# Patient Record
Sex: Female | Born: 1940 | Race: White | Hispanic: No | Marital: Married | Smoking: Never smoker
Health system: Southern US, Community
[De-identification: ages and names within clinical notes are randomized; demographics above are authoritative.]

## PROBLEM LIST (undated history)

## (undated) DIAGNOSIS — N189 Chronic kidney disease, unspecified: Secondary | ICD-10-CM

## (undated) DIAGNOSIS — E785 Hyperlipidemia, unspecified: Secondary | ICD-10-CM

## (undated) DIAGNOSIS — I10 Essential (primary) hypertension: Secondary | ICD-10-CM

## (undated) DIAGNOSIS — I480 Paroxysmal atrial fibrillation: Secondary | ICD-10-CM

## (undated) DIAGNOSIS — F0390 Unspecified dementia without behavioral disturbance: Secondary | ICD-10-CM

---

## 2004-03-19 ENCOUNTER — Ambulatory Visit: Payer: Self-pay | Admitting: Unknown Physician Specialty

## 2005-01-08 ENCOUNTER — Ambulatory Visit: Payer: Self-pay | Admitting: Internal Medicine

## 2006-01-09 ENCOUNTER — Ambulatory Visit: Payer: Self-pay | Admitting: Internal Medicine

## 2007-01-12 ENCOUNTER — Ambulatory Visit: Payer: Self-pay | Admitting: Internal Medicine

## 2007-10-07 ENCOUNTER — Other Ambulatory Visit: Payer: Self-pay

## 2007-10-07 ENCOUNTER — Emergency Department: Payer: Self-pay | Admitting: Emergency Medicine

## 2008-01-13 ENCOUNTER — Ambulatory Visit: Payer: Self-pay | Admitting: Internal Medicine

## 2009-01-16 ENCOUNTER — Ambulatory Visit: Payer: Self-pay | Admitting: Internal Medicine

## 2010-01-17 ENCOUNTER — Ambulatory Visit: Payer: Self-pay | Admitting: Internal Medicine

## 2011-01-21 ENCOUNTER — Ambulatory Visit: Payer: Self-pay | Admitting: Family Medicine

## 2012-01-22 ENCOUNTER — Ambulatory Visit: Payer: Self-pay | Admitting: Family Medicine

## 2013-01-22 ENCOUNTER — Ambulatory Visit: Payer: Self-pay | Admitting: Family Medicine

## 2014-01-24 ENCOUNTER — Ambulatory Visit: Payer: Self-pay | Admitting: Family Medicine

## 2014-10-06 ENCOUNTER — Other Ambulatory Visit: Payer: Self-pay

## 2014-10-06 DIAGNOSIS — Z1231 Encounter for screening mammogram for malignant neoplasm of breast: Secondary | ICD-10-CM

## 2015-01-26 ENCOUNTER — Other Ambulatory Visit: Payer: Self-pay

## 2015-01-26 ENCOUNTER — Ambulatory Visit: Admission: RE | Admit: 2015-01-26 | Payer: Medicare Other | Source: Ambulatory Visit

## 2015-01-26 ENCOUNTER — Other Ambulatory Visit: Payer: Self-pay | Admitting: Family Medicine

## 2015-01-26 ENCOUNTER — Ambulatory Visit
Admission: RE | Admit: 2015-01-26 | Discharge: 2015-01-26 | Disposition: A | Discharge status: Home / Self Care | Payer: Medicare Other | Source: Ambulatory Visit | Attending: Family Medicine | Admitting: Family Medicine

## 2015-01-26 DIAGNOSIS — Z1231 Encounter for screening mammogram for malignant neoplasm of breast: Secondary | ICD-10-CM

## 2015-08-23 ENCOUNTER — Other Ambulatory Visit: Payer: Self-pay | Admitting: Family Medicine

## 2015-08-23 DIAGNOSIS — Z1231 Encounter for screening mammogram for malignant neoplasm of breast: Secondary | ICD-10-CM

## 2016-01-29 ENCOUNTER — Other Ambulatory Visit: Payer: Self-pay | Admitting: Family Medicine

## 2016-01-29 ENCOUNTER — Ambulatory Visit
Admission: RE | Admit: 2016-01-29 | Discharge: 2016-01-29 | Disposition: A | Discharge status: Home / Self Care | Payer: Medicare Other | Source: Ambulatory Visit | Attending: Family Medicine | Admitting: Family Medicine

## 2016-01-29 DIAGNOSIS — R928 Other abnormal and inconclusive findings on diagnostic imaging of breast: Secondary | ICD-10-CM | POA: Diagnosis not present

## 2016-01-29 DIAGNOSIS — Z1231 Encounter for screening mammogram for malignant neoplasm of breast: Secondary | ICD-10-CM

## 2016-01-30 ENCOUNTER — Other Ambulatory Visit: Payer: Self-pay | Admitting: Family Medicine

## 2016-01-30 DIAGNOSIS — N63 Unspecified lump in unspecified breast: Secondary | ICD-10-CM

## 2016-02-21 ENCOUNTER — Ambulatory Visit
Admission: RE | Admit: 2016-02-21 | Discharge: 2016-02-21 | Disposition: A | Discharge status: Home / Self Care | Payer: Medicare Other | Source: Ambulatory Visit | Attending: Family Medicine | Admitting: Family Medicine

## 2016-02-21 DIAGNOSIS — N63 Unspecified lump in unspecified breast: Secondary | ICD-10-CM

## 2016-04-18 ENCOUNTER — Other Ambulatory Visit: Payer: Self-pay | Admitting: Family Medicine

## 2016-04-18 DIAGNOSIS — N63 Unspecified lump in unspecified breast: Secondary | ICD-10-CM

## 2016-04-18 DIAGNOSIS — Z0000 Encounter for general adult medical examination without abnormal findings: Secondary | ICD-10-CM | POA: Insufficient documentation

## 2016-08-21 ENCOUNTER — Ambulatory Visit: Payer: Medicare Other | Attending: Family Medicine

## 2016-08-21 ENCOUNTER — Other Ambulatory Visit: Payer: Medicare Other

## 2016-09-26 ENCOUNTER — Ambulatory Visit
Admission: RE | Admit: 2016-09-26 | Discharge: 2016-09-26 | Disposition: A | Discharge status: Home / Self Care | Payer: Medicare Other | Source: Ambulatory Visit | Attending: Family Medicine | Admitting: Family Medicine

## 2016-09-26 DIAGNOSIS — N6320 Unspecified lump in the left breast, unspecified quadrant: Secondary | ICD-10-CM | POA: Diagnosis present

## 2016-09-26 DIAGNOSIS — N63 Unspecified lump in unspecified breast: Secondary | ICD-10-CM

## 2017-02-10 ENCOUNTER — Other Ambulatory Visit: Payer: Self-pay | Admitting: Family Medicine

## 2017-02-10 DIAGNOSIS — Z1231 Encounter for screening mammogram for malignant neoplasm of breast: Secondary | ICD-10-CM

## 2017-02-13 ENCOUNTER — Ambulatory Visit
Admission: RE | Admit: 2017-02-13 | Discharge: 2017-02-13 | Disposition: A | Discharge status: Home / Self Care | Payer: Medicare Other | Source: Ambulatory Visit | Attending: Family Medicine | Admitting: Family Medicine

## 2017-02-13 DIAGNOSIS — Z1231 Encounter for screening mammogram for malignant neoplasm of breast: Secondary | ICD-10-CM | POA: Diagnosis present

## 2017-08-22 ENCOUNTER — Other Ambulatory Visit: Payer: Self-pay | Admitting: Family Medicine

## 2017-08-22 DIAGNOSIS — Z1231 Encounter for screening mammogram for malignant neoplasm of breast: Secondary | ICD-10-CM

## 2018-02-16 ENCOUNTER — Ambulatory Visit
Admission: RE | Admit: 2018-02-16 | Discharge: 2018-02-16 | Disposition: A | Discharge status: Home / Self Care | Payer: Medicare Other | Source: Ambulatory Visit | Attending: Family Medicine | Admitting: Family Medicine

## 2018-02-16 DIAGNOSIS — Z1231 Encounter for screening mammogram for malignant neoplasm of breast: Secondary | ICD-10-CM | POA: Insufficient documentation

## 2020-02-11 DIAGNOSIS — N1832 Chronic kidney disease, stage 3b: Secondary | ICD-10-CM | POA: Insufficient documentation

## 2020-03-27 ENCOUNTER — Emergency Department: Payer: Medicare PPO

## 2020-03-27 ENCOUNTER — Other Ambulatory Visit: Payer: Self-pay

## 2020-03-27 ENCOUNTER — Encounter: Payer: Self-pay | Admitting: Emergency Medicine

## 2020-03-27 ENCOUNTER — Inpatient Hospital Stay
Admission: EM | Admit: 2020-03-27 | Discharge: 2020-03-30 | DRG: 309 | Disposition: A | Discharge status: Home Health | Payer: Medicare PPO | Attending: Internal Medicine | Admitting: Internal Medicine

## 2020-03-27 DIAGNOSIS — F05 Delirium due to known physiological condition: Secondary | ICD-10-CM | POA: Diagnosis not present

## 2020-03-27 DIAGNOSIS — E876 Hypokalemia: Secondary | ICD-10-CM | POA: Diagnosis present

## 2020-03-27 DIAGNOSIS — E785 Hyperlipidemia, unspecified: Secondary | ICD-10-CM | POA: Diagnosis present

## 2020-03-27 DIAGNOSIS — R7401 Elevation of levels of liver transaminase levels: Secondary | ICD-10-CM | POA: Diagnosis present

## 2020-03-27 DIAGNOSIS — N179 Acute kidney failure, unspecified: Secondary | ICD-10-CM | POA: Diagnosis present

## 2020-03-27 DIAGNOSIS — R5381 Other malaise: Secondary | ICD-10-CM

## 2020-03-27 DIAGNOSIS — Z882 Allergy status to sulfonamides status: Secondary | ICD-10-CM | POA: Diagnosis not present

## 2020-03-27 DIAGNOSIS — I4891 Unspecified atrial fibrillation: Secondary | ICD-10-CM | POA: Diagnosis present

## 2020-03-27 DIAGNOSIS — Z888 Allergy status to other drugs, medicaments and biological substances status: Secondary | ICD-10-CM | POA: Diagnosis not present

## 2020-03-27 DIAGNOSIS — D649 Anemia, unspecified: Secondary | ICD-10-CM | POA: Diagnosis present

## 2020-03-27 DIAGNOSIS — R8271 Bacteriuria: Secondary | ICD-10-CM | POA: Diagnosis present

## 2020-03-27 DIAGNOSIS — B961 Klebsiella pneumoniae [K. pneumoniae] as the cause of diseases classified elsewhere: Secondary | ICD-10-CM | POA: Diagnosis present

## 2020-03-27 DIAGNOSIS — R55 Syncope and collapse: Secondary | ICD-10-CM | POA: Diagnosis present

## 2020-03-27 DIAGNOSIS — I361 Nonrheumatic tricuspid (valve) insufficiency: Secondary | ICD-10-CM | POA: Diagnosis not present

## 2020-03-27 DIAGNOSIS — I10 Essential (primary) hypertension: Secondary | ICD-10-CM | POA: Diagnosis present

## 2020-03-27 DIAGNOSIS — F0390 Unspecified dementia without behavioral disturbance: Secondary | ICD-10-CM | POA: Diagnosis present

## 2020-03-27 DIAGNOSIS — Z20822 Contact with and (suspected) exposure to covid-19: Secondary | ICD-10-CM | POA: Diagnosis present

## 2020-03-27 DIAGNOSIS — Z880 Allergy status to penicillin: Secondary | ICD-10-CM | POA: Diagnosis not present

## 2020-03-27 DIAGNOSIS — I340 Nonrheumatic mitral (valve) insufficiency: Secondary | ICD-10-CM | POA: Diagnosis not present

## 2020-03-27 HISTORY — DX: Essential (primary) hypertension: I10

## 2020-03-27 LAB — BASIC METABOLIC PANEL
Anion gap: 12 (ref 5–15)
BUN: 37 mg/dL — ABNORMAL HIGH (ref 8–23)
CO2: 23 mmol/L (ref 22–32)
Calcium: 8 mg/dL — ABNORMAL LOW (ref 8.9–10.3)
Chloride: 99 mmol/L (ref 98–111)
Creatinine, Ser: 1.8 mg/dL — ABNORMAL HIGH (ref 0.44–1.00)
GFR, Estimated: 28 mL/min — ABNORMAL LOW (ref >60)
Glucose, Bld: 129 mg/dL — ABNORMAL HIGH (ref 70–99)
Potassium: <2 mmol/L — CL (ref 3.5–5.1)
Sodium: 134 mmol/L — ABNORMAL LOW (ref 135–145)

## 2020-03-27 LAB — CBC
HCT: 34.7 % — ABNORMAL LOW (ref 36.0–46.0)
Hemoglobin: 11.7 g/dL — ABNORMAL LOW (ref 12.0–15.0)
MCH: 29.5 pg (ref 26.0–34.0)
MCHC: 33.7 g/dL (ref 30.0–36.0)
MCV: 87.6 fL (ref 80.0–100.0)
Platelets: 187 10*3/uL (ref 150–400)
RBC: 3.96 MIL/uL (ref 3.87–5.11)
RDW: 13.2 % (ref 11.5–15.5)
WBC: 9.5 10*3/uL (ref 4.0–10.5)
nRBC: 0 % (ref 0.0–0.2)

## 2020-03-27 LAB — HEPATIC FUNCTION PANEL
ALT: 22 U/L (ref 0–44)
AST: 54 U/L — ABNORMAL HIGH (ref 15–41)
Albumin: 3.1 g/dL — ABNORMAL LOW (ref 3.5–5.0)
Alkaline Phosphatase: 62 U/L (ref 38–126)
Bilirubin, Direct: 0.2 mg/dL (ref 0.0–0.2)
Indirect Bilirubin: 0.7 mg/dL (ref 0.3–0.9)
Total Bilirubin: 0.9 mg/dL (ref 0.3–1.2)
Total Protein: 7.1 g/dL (ref 6.5–8.1)

## 2020-03-27 LAB — MAGNESIUM: Magnesium: 1.5 mg/dL — ABNORMAL LOW (ref 1.7–2.4)

## 2020-03-27 LAB — TROPONIN I (HIGH SENSITIVITY): Troponin I (High Sensitivity): 25 ng/L — ABNORMAL HIGH (ref <18)

## 2020-03-27 MED ORDER — MAGNESIUM SULFATE 2 GM/50ML IV SOLN
2.0000 g | Freq: Once | INTRAVENOUS | Status: AC
  Administered 2020-03-28: 2 g via INTRAVENOUS
  Filled 2020-03-27: qty 50

## 2020-03-27 MED ORDER — ONDANSETRON HCL 4 MG/2ML IJ SOLN: 4.0000 mg | Freq: Four times a day (QID) | INTRAMUSCULAR | Status: DC | PRN

## 2020-03-27 MED ORDER — ALPRAZOLAM 0.25 MG PO TABS
0.2500 mg | ORAL_TABLET | Freq: Two times a day (BID) | ORAL | Status: DC | PRN
  Administered 2020-03-28: 0.25 mg via ORAL
  Filled 2020-03-27: qty 1

## 2020-03-27 MED ORDER — POTASSIUM CHLORIDE IN NACL 40-0.9 MEQ/L-% IV SOLN
INTRAVENOUS | Status: DC
  Filled 2020-03-27 (×7): qty 1000

## 2020-03-27 MED ORDER — SODIUM CHLORIDE 0.9 % IV BOLUS
1000.0000 mL | Freq: Once | INTRAVENOUS | Status: AC
  Administered 2020-03-27: 1000 mL via INTRAVENOUS

## 2020-03-27 MED ORDER — POTASSIUM CHLORIDE 10 MEQ/100ML IV SOLN
10.0000 meq | INTRAVENOUS | Status: AC
  Administered 2020-03-27 – 2020-03-28 (×4): 10 meq via INTRAVENOUS
  Filled 2020-03-27 (×4): qty 100

## 2020-03-27 MED ORDER — ACETAMINOPHEN 325 MG PO TABS: 650.0000 mg | ORAL_TABLET | ORAL | Status: DC | PRN

## 2020-03-27 MED ORDER — PRAVASTATIN SODIUM 40 MG PO TABS
40.0000 mg | ORAL_TABLET | Freq: Every day | ORAL | Status: DC
  Administered 2020-03-28 – 2020-03-29 (×2): 40 mg via ORAL
  Filled 2020-03-27 (×2): qty 1

## 2020-03-27 MED ORDER — POTASSIUM CHLORIDE CRYS ER 20 MEQ PO TBCR
40.0000 meq | EXTENDED_RELEASE_TABLET | Freq: Once | ORAL | Status: AC
  Administered 2020-03-27: 40 meq via ORAL
  Filled 2020-03-27: qty 2

## 2020-03-27 MED ORDER — AMLODIPINE BESYLATE 5 MG PO TABS
5.0000 mg | ORAL_TABLET | Freq: Every day | ORAL | Status: DC
  Administered 2020-03-28 – 2020-03-29 (×2): 5 mg via ORAL
  Filled 2020-03-27 (×2): qty 1

## 2020-03-27 MED ORDER — SODIUM CHLORIDE 0.9 % IV SOLN: Freq: Once | INTRAVENOUS | Status: AC

## 2020-03-27 MED ORDER — CALCIUM-VITAMIN D 500-200 MG-UNIT PO TABS
2.0000 | ORAL_TABLET | Freq: Every day | ORAL | Status: DC
  Filled 2020-03-27: qty 2

## 2020-03-27 MED ORDER — DILTIAZEM HCL-DEXTROSE 125-5 MG/125ML-% IV SOLN (PREMIX): 5.0000 mg/h | INTRAVENOUS | Status: DC

## 2020-03-27 MED ORDER — APIXABAN 2.5 MG PO TABS
2.5000 mg | ORAL_TABLET | Freq: Two times a day (BID) | ORAL | Status: DC
  Administered 2020-03-28 – 2020-03-30 (×6): 2.5 mg via ORAL
  Filled 2020-03-27 (×8): qty 1

## 2020-03-27 MED ORDER — ZOLPIDEM TARTRATE 5 MG PO TABS
5.0000 mg | ORAL_TABLET | Freq: Every evening | ORAL | Status: DC | PRN
  Administered 2020-03-29: 5 mg via ORAL
  Filled 2020-03-27: qty 1

## 2020-03-27 MED ORDER — MAGNESIUM SULFATE 2 GM/50ML IV SOLN
2.0000 g | Freq: Once | INTRAVENOUS | Status: AC
  Administered 2020-03-27: 2 g via INTRAVENOUS
  Filled 2020-03-27: qty 50

## 2020-03-27 MED ORDER — ADULT MULTIVITAMIN W/MINERALS CH
1.0000 | ORAL_TABLET | Freq: Every day | ORAL | Status: DC
  Administered 2020-03-28 – 2020-03-30 (×3): 1 via ORAL
  Filled 2020-03-27 (×3): qty 1

## 2020-03-27 MED ORDER — DILTIAZEM HCL-DEXTROSE 125-5 MG/125ML-% IV SOLN (PREMIX)
5.0000 mg/h | INTRAVENOUS | Status: DC
  Administered 2020-03-27: 5 mg/h via INTRAVENOUS
  Administered 2020-03-28 – 2020-03-29 (×2): 7.5 mg/h via INTRAVENOUS
  Filled 2020-03-27 (×3): qty 125

## 2020-03-27 NOTE — H&P (Signed)
PATIENT NAME: Christine Johns    MR#:  [id number]  DATE OF BIRTH:  1941-06-02  DATE OF ADMISSION:  03/27/2020  PRIMARY CARE PHYSICIAN: Marisue Ivan, MD   REQUESTING/REFERRING PHYSICIAN: Willy Eddy, MD  CHIEF COMPLAINT:   Chief Complaint  Patient presents with  . Loss of Consciousness    HISTORY OF PRESENT ILLNESS:  Christine Johns  is a 79 y.o. female with a known history of hypertension and dementia, presented to the emergency room with acute onset of syncope today.  She had a near syncope earlier that was witnessed by her husband when she went to the bathroom and was coming back and before she went down her husband caught her.  She hurt her right knee falling.  Her son-in-law gives most of the history and give her Mobic and within 10 minutes he slumped over in her chair and lost consciousness for less than a minute per his report.  He called 911.  She denied any chest pain or palpitations cough or wheezing or hemoptysis.  He stated that she cannot with no vomiting.  No fever or chills.  No dysuria, oliguria, hematuria or urgency or frequency or flank pain.  Upon presentation to the emergency room, blood pressure was 106/74 with a heart rate of 143 and otherwise normal vital signs.  Labs revealed hypokalemia with potassium less than 2 magnesium was 1.5 and a BUN of 37 with creatinine 1.8.  LFTs showed slightly elevated AST and low albumin of 3.1.  High-sensitivity troponin was 25.  CBC showed mild anemia and respiratory panel is currently pending.  Chest x-ray showed streaky opacities in the left lung base favoring atelectasis though early airspace disease could present similarly.  Right knee x-ray showed tricompartmental degenerative changes that is most severe in the patellofemoral compartment with no acute osseous abnormalities.  The patient was given 1 L bolus of IV normal saline for 100 mL/h, IV Cardizem drip, 2 g of IV magnesium sulfate, 10-minute: As IV  potassium chloride and 40 mEq p.o. potassium chloride.  She will be admitted to progressive unit bed for further evaluation and management. PAST MEDICAL HISTORY:   Past Medical History:  Diagnosis Date  . Hypertension     PAST SURGICAL HISTORY:  History reviewed. No pertinent surgical history.  She did not have any previous surgeries. SOCIAL HISTORY:   Social History   Tobacco Use  . Smoking status: Never Smoker  . Smokeless tobacco: Never Used  Substance Use Topics  . Alcohol use: Never    FAMILY HISTORY:   Family History  Problem Relation Age of Onset  . Breast cancer Neg Hx   Her brother died from massive MI.  DRUG ALLERGIES:   Allergies  Allergen Reactions  . Atorvastatin Other (See Comments)  . Penicillins Itching and Swelling  . Sulfa Antibiotics Itching and Swelling    REVIEW OF SYSTEMS:   ROS As per history of present illness. All pertinent systems were reviewed above. Constitutional, HEENT, cardiovascular, respiratory, GI, GU, musculoskeletal, neuro, psychiatric, endocrine, integumentary and hematologic systems were reviewed and are otherwise negative/unremarkable except for positive findings mentioned above in the HPI.   MEDICATIONS AT HOME:   Prior to Admission medications   Medication Sig Start Date End Date Taking? Authorizing Provider  amLODipine (NORVASC) 5 MG tablet Take 5 mg by mouth daily. 03/16/20  Yes [provider]  Calcium Carbonate-Vitamin D 600-400 MG-UNIT tablet Take 2 tablets by mouth daily.   Yes  [provider]  lisinopril (ZESTRIL) 40 MG tablet Take 40 mg by mouth daily. 03/16/20  Yes [provider]  lovastatin (MEVACOR) 20 MG tablet Take 30 mg by mouth at bedtime.   Yes [provider]  meloxicam (MOBIC) 15 MG tablet Take 15 mg by mouth daily. 03/27/20  Yes [provider]  Multiple Vitamin (MULTI-VITAMIN) tablet Take 1 tablet by mouth daily.   Yes [provider]      VITAL  SIGNS:  Blood pressure 107/64, pulse (!) 111, temperature 98 F (36.7 C), temperature source Oral, resp. rate 15, height 5\' 3"  (1.6 m), weight 59.9 kg, SpO2 99 %.  PHYSICAL EXAMINATION:  Physical Exam  GENERAL:  79 y.o.-year-old pleasantly demented Caucasian female patient lying in the bed with no acute distress.  EYES: Pupils equal, round, reactive to light and accommodation. No scleral icterus. Extraocular muscles intact.  HEENT: Head atraumatic, normocephalic. Oropharynx and nasopharynx clear.  NECK:  Supple, no jugular venous distention. No thyroid enlargement, no tenderness.  LUNGS: Normal breath sounds bilaterally, no wheezing, rales,rhonchi or crepitation. No use of accessory muscles of respiration.  CARDIOVASCULAR: Irregularly irregular tachycardic rhythm, S1, S2 normal. No murmurs, rubs, or gallops.  ABDOMEN: Soft, nondistended, nontender. Bowel sounds present. No organomegaly or mass.  EXTREMITIES: No pedal edema, cyanosis, or clubbing.  NEUROLOGIC: Cranial nerves II through XII are intact. Muscle strength 5/5 in all extremities. Sensation intact. Gait not checked.  PSYCHIATRIC: The patient is alert and oriented x 2 to place and person but not to time.  Normal affect and good eye contact. SKIN: No obvious rash, lesion, or ulcer.   LABORATORY PANEL:   CBC Recent Labs  Lab 03/27/20 2135  WBC 9.5  HGB 11.7*  HCT 34.7*  PLT 187   ------------------------------------------------------------------------------------------------------------------  Chemistries  Recent Labs  Lab 03/27/20 2135 03/27/20 2300  NA 134*  --   K <2.0*  --   CL 99  --   CO2 23  --   GLUCOSE 129*  --   BUN 37*  --   CREATININE 1.80*  --   CALCIUM 8.0*  --   MG 1.5*  --   AST  --  54*  ALT  --  22  ALKPHOS  --  62  BILITOT  --  0.9   ------------------------------------------------------------------------------------------------------------------  Cardiac Enzymes No results for input(s):  TROPONINI in the last 168 hours. ------------------------------------------------------------------------------------------------------------------  RADIOLOGY:  CT Head Wo Contrast  Result Date: 03/27/2020 CLINICAL DATA:  Syncope. EXAM: CT HEAD WITHOUT CONTRAST TECHNIQUE: Contiguous axial images were obtained from the base of the skull through the vertex without intravenous contrast. COMPARISON:  None. FINDINGS: Brain: There is mild cerebral atrophy with widening of the extra-axial spaces and ventricular dilatation. There are areas of decreased attenuation within the white matter tracts of the supratentorial brain, consistent with microvascular disease changes. Vascular: No hyperdense vessel or unexpected calcification. Skull: Normal. Negative for fracture or focal lesion. Sinuses/Orbits: No acute finding. Other: None. IMPRESSION: 1. Generalized cerebral atrophy. 2. No acute intracranial abnormality. Electronically Signed   By: 03/29/2020 M.D.   On: 03/27/2020 22:51   DG Chest Portable 1 View  Result Date: 03/27/2020 CLINICAL DATA:  Syncope, falls EXAM: PORTABLE CHEST 1 VIEW COMPARISON:  None. FINDINGS: Streaky opacities in the left lung base, possibly reflecting atelectasis or early airspace disease. Lungs are otherwise clear. No pneumothorax or visible effusion. The aorta is calcified. The remaining cardiomediastinal contours are unremarkable. No acute osseous or soft tissue abnormality.  Degenerative changes are present in the imaged spine and shoulders. Telemetry leads overlie the chest. IMPRESSION: Streaky opacities in the left lung base, favor atelectasis though early airspace disease could present similarly in the appropriate clinical context. Electronically Signed   By: Kreg Shropshire M.D.   On: 03/27/2020 21:55   DG Knee Complete 4 Views Right  Result Date: 03/27/2020 CLINICAL DATA:  Status post trauma. EXAM: RIGHT KNEE - COMPLETE 4+ VIEW COMPARISON:  None. FINDINGS: No evidence of  fracture, dislocation, or joint effusion. There is mild medial and lateral tibiofemoral compartment space narrowing. Marked severity patellofemoral narrowing is noted. Soft tissues are unremarkable. IMPRESSION: 1. No acute osseous abnormality. 2. Tricompartmental degenerative changes, most severe in the patellofemoral compartment. Electronically Signed   By: Aram Candela M.D.   On: 03/27/2020 23:06      IMPRESSION AND PLAN:   1.  Syncope likely secondary to new onset atrial fibrillation with rapid ventricular response. -The patient will be admitted to a progressive unit bed. -We will continue her on IV Cardizem drip. -His CHA2DS2-VASc score is 4 and therefore we will start her on p.o. Eliquis. -Cardiology consult and 2D echo will be obtained. -I notified Dr. Duke Salvia about the patient.  2.  Severe hypokalemia. -Potassium will be aggressively replaced and followed.  3.  Hypomagnesemia. -Magnesium will be optimized.  4.  Acute kidney injury. -The patient will be hydrated with IV normal saline and will follow BMPs. -We will hold off his ACE inhibitor therapy as well as Mobic.  5.  Dyslipidemia. -We will continue statin therapy.  6.  DVT prophylaxis. -The patient be placed on p.o. Eliquis as mentioned above.    All the records are reviewed and case discussed with ED provider. The plan of care was discussed in details with the patient (and family). I answered all questions. The patient agreed to proceed with the above mentioned plan. Further management will depend upon hospital course.  CODE STATUS: Full code  Status is: Inpatient  Remains inpatient appropriate because:Altered mental status, Ongoing diagnostic testing needed not appropriate for outpatient work up, Unsafe d/c plan, IV treatments appropriate due to intensity of illness or inability to take PO and Inpatient level of care appropriate due to severity of illness   Dispo: The patient is from: Home               Anticipated d/c is to: Home              Anticipated d/c date is: 2 days              Patient currently is not medically stable to d/c.   TOTAL TIME TAKING CARE OF THIS PATIENT: 55 minutes.    Hannah Beat M.D on 03/27/2020 at 11:40 PM  [facility]   From 7 PM-7 AM, contact night-coverage [email or web address]  CC: Primary care physician; Marisue Ivan, MD

## 2020-03-27 NOTE — ED Notes (Signed)
Date and time results received: 03/27/20 2222 (use smartphrase ".now" to insert current time)  Test: potassium Critical Value: <2  Name of Provider Notified: robinson  Orders Received? Or Actions Taken?: Orders Received - See Orders for details

## 2020-03-27 NOTE — ED Provider Notes (Signed)
[facility] Emergency Department Provider Note    First MD Initiated Contact with Patient 03/27/20 2122     (approximate)  I have reviewed the triage vital signs and the nursing notes.   HISTORY  Chief Complaint Loss of Consciousness  History limited due to dementia  HPI JAICE LAGUE is a 79 y.o. female with a history of hypertension presents to the ER for evaluation of syncopal event.  Patient amnestic to any syncopal event denies any pain.  EMS was called to evaluate patient and found her in A. fib with RVR.  Patient unable to provide any additional history.  No reported history of A. fib.  Not on anticoagulation.  No new medications.    Past Medical History:  Diagnosis Date  . Hypertension    Family History  Problem Relation Age of Onset  . Breast cancer Neg Hx    History reviewed. No pertinent surgical history. There are no problems to display for this patient.     Prior to Admission medications   Medication Sig Start Date End Date Taking? Authorizing Provider  amLODipine (NORVASC) 5 MG tablet Take 5 mg by mouth daily. 03/16/20  Yes [provider]  Calcium Carbonate-Vitamin D 600-400 MG-UNIT tablet Take 2 tablets by mouth daily.   Yes [provider]  lisinopril (ZESTRIL) 40 MG tablet Take 40 mg by mouth daily. 03/16/20  Yes [provider]  lovastatin (MEVACOR) 20 MG tablet Take 30 mg by mouth at bedtime.   Yes [provider]  meloxicam (MOBIC) 15 MG tablet Take 15 mg by mouth daily. 03/27/20  Yes [provider]  Multiple Vitamin (MULTI-VITAMIN) tablet Take 1 tablet by mouth daily.   Yes [provider]    Allergies Atorvastatin, Penicillins, and Sulfa antibiotics    Social History Social History   Tobacco Use  . Smoking status: Never Smoker  . Smokeless tobacco: Never Used  Substance Use Topics  . Alcohol use: Never  . Drug use: Never    Review of Systems Patient  denies headaches, rhinorrhea, blurry vision, numbness, shortness of breath, chest pain, edema, cough, abdominal pain, nausea, vomiting, diarrhea, dysuria, fevers, rashes or hallucinations unless otherwise stated above in HPI. ____________________________________________   PHYSICAL EXAM:  VITAL SIGNS: Vitals:   03/27/20 2128 03/27/20 2136  BP:  106/74  Pulse: (!) 143   Resp: 15   Temp: 98 F (36.7 C)   SpO2: 99%     Constitutional: Alert, disoriented but pleasant and cooperative Eyes: Conjunctivae are normal.  Head: Atraumatic. Nose: No congestion/rhinnorhea. Mouth/Throat: Mucous membranes are moist.   Neck: No stridor. Painless ROM.  Cardiovascular: tachycardic irregularly irregular rhythm. Grossly normal heart sounds.  Good peripheral circulation. Respiratory: Normal respiratory effort.  No retractions. Lungs CTAB. Gastrointestinal: Soft and nontender. No distention. No abdominal bruits. No CVA tenderness. Genitourinary: deferred Musculoskeletal: No lower extremity tenderness nor edema.  No joint effusions. Neurologic:  Normal speech and language. No gross focal neurologic deficits are appreciated. No facial droop Skin:  Skin is warm, dry and intact. No rash noted. Psychiatric: pleasant and cooperative ____________________________________________   LABS (all labs ordered are listed, but only abnormal results are displayed)  Results for orders placed or performed during the hospital encounter of 03/27/20 (from the past 24 hour(s))  Basic metabolic panel     Status: Abnormal   Collection Time: 03/27/20  9:35 PM  Result Value Ref Range   Sodium 134 (L) 135 - 145 mmol/L   Potassium <  2.0 (LL) 3.5 - 5.1 mmol/L   Chloride 99 98 - 111 mmol/L   CO2 23 22 - 32 mmol/L   Glucose, Bld 129 (H) 70 - 99 mg/dL   BUN 37 (H) 8 - 23 mg/dL   Creatinine, Ser 6.19 (H) 0.44 - 1.00 mg/dL   Calcium 8.0 (L) 8.9 - 10.3 mg/dL   GFR, Estimated 28 (L) >60 mL/min   Anion gap 12 5 - 15  CBC      Status: Abnormal   Collection Time: 03/27/20  9:35 PM  Result Value Ref Range   WBC 9.5 4.0 - 10.5 K/uL   RBC 3.96 3.87 - 5.11 MIL/uL   Hemoglobin 11.7 (L) 12.0 - 15.0 g/dL   HCT 50.9 (L) 36 - 46 %   MCV 87.6 80.0 - 100.0 fL   MCH 29.5 26.0 - 34.0 pg   MCHC 33.7 30.0 - 36.0 g/dL   RDW 32.6 71.2 - 45.8 %   Platelets 187 150 - 400 K/uL   nRBC 0.0 0.0 - 0.2 %  Troponin I (High Sensitivity)     Status: Abnormal   Collection Time: 03/27/20  9:35 PM  Result Value Ref Range   Troponin I (High Sensitivity) 25 (H) <18 ng/L  Magnesium     Status: Abnormal   Collection Time: 03/27/20  9:35 PM  Result Value Ref Range   Magnesium 1.5 (L) 1.7 - 2.4 mg/dL   ____________________________________________  EKG My review and personal interpretation at Time: 21:28   Indication: syncope  Rate: 145  Rhythm: afib with rvr Axis: normal Other: nonspecific st abn likely rate dependent, abnml ekg ____________________________________________  RADIOLOGY  I personally reviewed all radiographic images ordered to evaluate for the above acute complaints and reviewed radiology reports and findings.  These findings were personally discussed with the patient.  Please see medical record for radiology report. ____________________________________________   PROCEDURES  Procedure(s) performed:  .Critical Care Performed by: Willy Eddy, MD Authorized by: Willy Eddy, MD   Critical care provider statement:    Critical care time (minutes):  35   Critical care time was exclusive of:  Separately billable procedures and treating other patients   Critical care was necessary to treat or prevent imminent or life-threatening deterioration of the following conditions:  Cardiac failure   Critical care was time spent personally by me on the following activities:  Development of treatment plan with patient or surrogate, discussions with consultants, evaluation of patient's response to treatment, examination of  patient, obtaining history from patient or surrogate, ordering and performing treatments and interventions, ordering and review of laboratory studies, ordering and review of radiographic studies, pulse oximetry, re-evaluation of patient's condition and review of old charts      Critical Care performed: yes ____________________________________________   INITIAL IMPRESSION / ASSESSMENT AND PLAN / ED COURSE  Pertinent labs & imaging results that were available during my care of the patient were reviewed by me and considered in my medical decision making (see chart for details).   DDX: Dehydration, dysrhythmia, electrolyte abnormality, anemia, sepsis, IPH  MENDE BISWELL is a 79 y.o. who presents to the ED with presentation as described above patient arrives with A. fib with RVR but normotensive.  Pleasant and cooperative.  Amnestic to any syncopal events.  Will order CT imaging of the head will order x-rays to evaluate for any evidence of traumatic injury.  Her abdominal exam is soft and benign.  She denies any chest pain but blood work sent off  for the but differential.  On cardiac monitor she is in A. fib with RVR heart rates in the 130s.  Will give IV fluids.  Will start on a Cardizem infusion.  Clinical Course as of Mar 27 2254  Mon Mar 27, 2020  2240 Clinic shows evidence of hypomagnesemia I have ordered IV magnesium replacement.  Patient also has significant acute hypokalemia mild AKI.  Will order both IV and oral potassium replacement.  Heart rate now in the 1 teens and 120s significantly improving.   [PR]  2255 Case discussed with son-in-law at bedside.  Confirms that she does not have a previous known history of A. fib.  No clear inciting events to explain her electrolyte abnormalities her presenting complaints.  Informed him that we will be admitting her to the hospital for further medical management.   [PR]    Clinical Course User Index [PR] Willy Eddy, MD    The patient  was evaluated in Emergency Department today for the symptoms described in the history of present illness. He/she was evaluated in the context of the global COVID-19 pandemic, which necessitated consideration that the patient might be at risk for infection with the SARS-CoV-2 virus that causes COVID-19. Institutional protocols and algorithms that pertain to the evaluation of patients at risk for COVID-19 are in a state of rapid change based on information released by regulatory bodies including the CDC and federal and state organizations. These policies and algorithms were followed during the patient's care in the ED.  As part of my medical decision making, I reviewed the following data within the electronic MEDICAL RECORD NUMBER Nursing notes reviewed and incorporated, Labs reviewed, notes from prior ED visits and [place] Controlled Substance Database   ____________________________________________   FINAL CLINICAL IMPRESSION(S) / ED DIAGNOSES  Final diagnoses:  Syncope and collapse  Atrial fibrillation with rapid ventricular response (HCC)  Acute hypokalemia  Hypomagnesemia      NEW MEDICATIONS STARTED DURING THIS VISIT:  New Prescriptions   No medications on file     Note:  This document was prepared using Dragon voice recognition software and may include unintentional dictation errors.    Willy Eddy, MD 03/27/20 2302

## 2020-03-27 NOTE — ED Triage Notes (Signed)
Pt to ED via EMS. Pt had two syncope episodes about hour apart. EMS arrived on scene and pt was in Afib wit heart rate  130s to 170s. Pt has no Hx of Afib. Pt arrived to EMS a/o x 4 and denies pain.  BP:114/74 Temp: 98.6 CBG 164  Pt has 18 gauge in RA.

## 2020-03-28 ENCOUNTER — Encounter: Payer: Self-pay | Admitting: Family Medicine

## 2020-03-28 ENCOUNTER — Inpatient Hospital Stay (HOSPITAL_COMMUNITY)
Admit: 2020-03-28 | Discharge: 2020-03-28 | Disposition: A | Discharge status: Home / Self Care | Payer: Medicare PPO | Attending: Family Medicine | Admitting: Family Medicine

## 2020-03-28 DIAGNOSIS — I4891 Unspecified atrial fibrillation: Secondary | ICD-10-CM | POA: Diagnosis not present

## 2020-03-28 DIAGNOSIS — I340 Nonrheumatic mitral (valve) insufficiency: Secondary | ICD-10-CM

## 2020-03-28 DIAGNOSIS — I361 Nonrheumatic tricuspid (valve) insufficiency: Secondary | ICD-10-CM | POA: Diagnosis not present

## 2020-03-28 DIAGNOSIS — E876 Hypokalemia: Secondary | ICD-10-CM

## 2020-03-28 DIAGNOSIS — R55 Syncope and collapse: Secondary | ICD-10-CM

## 2020-03-28 LAB — CBC
HCT: 29.9 % — ABNORMAL LOW (ref 36.0–46.0)
Hemoglobin: 10.6 g/dL — ABNORMAL LOW (ref 12.0–15.0)
MCH: 30.5 pg (ref 26.0–34.0)
MCHC: 35.5 g/dL (ref 30.0–36.0)
MCV: 85.9 fL (ref 80.0–100.0)
Platelets: 192 10*3/uL (ref 150–400)
RBC: 3.48 MIL/uL — ABNORMAL LOW (ref 3.87–5.11)
RDW: 13.2 % (ref 11.5–15.5)
WBC: 9.4 10*3/uL (ref 4.0–10.5)
nRBC: 0 % (ref 0.0–0.2)

## 2020-03-28 LAB — MAGNESIUM: Magnesium: 2.5 mg/dL — ABNORMAL HIGH (ref 1.7–2.4)

## 2020-03-28 LAB — RESPIRATORY PANEL BY RT PCR (FLU A&B, COVID)
Influenza A by PCR: NEGATIVE
Influenza B by PCR: NEGATIVE
SARS Coronavirus 2 by RT PCR: NEGATIVE

## 2020-03-28 LAB — BASIC METABOLIC PANEL
Anion gap: 10 (ref 5–15)
Anion gap: 10 (ref 5–15)
BUN: 28 mg/dL — ABNORMAL HIGH (ref 8–23)
BUN: 24 mg/dL — ABNORMAL HIGH (ref 8–23)
CO2: 21 mmol/L — ABNORMAL LOW (ref 22–32)
CO2: 20 mmol/L — ABNORMAL LOW (ref 22–32)
Calcium: 8 mg/dL — ABNORMAL LOW (ref 8.9–10.3)
Calcium: 8 mg/dL — ABNORMAL LOW (ref 8.9–10.3)
Chloride: 106 mmol/L (ref 98–111)
Chloride: 108 mmol/L (ref 98–111)
Creatinine, Ser: 1.26 mg/dL — ABNORMAL HIGH (ref 0.44–1.00)
Creatinine, Ser: 1.1 mg/dL — ABNORMAL HIGH (ref 0.44–1.00)
GFR, Estimated: 43 mL/min — ABNORMAL LOW (ref >60)
GFR, Estimated: 50 mL/min — ABNORMAL LOW (ref >60)
Glucose, Bld: 118 mg/dL — ABNORMAL HIGH (ref 70–99)
Glucose, Bld: 124 mg/dL — ABNORMAL HIGH (ref 70–99)
Potassium: 2.5 mmol/L — CL (ref 3.5–5.1)
Potassium: 3.6 mmol/L (ref 3.5–5.1)
Sodium: 137 mmol/L (ref 135–145)
Sodium: 138 mmol/L (ref 135–145)

## 2020-03-28 LAB — URINALYSIS, COMPLETE (UACMP) WITH MICROSCOPIC
Bilirubin Urine: NEGATIVE
Glucose, UA: NEGATIVE mg/dL
Ketones, ur: NEGATIVE mg/dL
Nitrite: POSITIVE — AB
Protein, ur: NEGATIVE mg/dL
Specific Gravity, Urine: 1.009 (ref 1.005–1.030)
pH: 5 (ref 5.0–8.0)

## 2020-03-28 LAB — LIPID PANEL
Cholesterol: 125 mg/dL (ref 0–200)
HDL: 33 mg/dL — ABNORMAL LOW (ref >40)
LDL Cholesterol: 73 mg/dL (ref 0–99)
Total CHOL/HDL Ratio: 3.8 RATIO
Triglycerides: 93 mg/dL (ref <150)
VLDL: 19 mg/dL (ref 0–40)

## 2020-03-28 LAB — BRAIN NATRIURETIC PEPTIDE: B Natriuretic Peptide: 97.7 pg/mL (ref 0.0–100.0)

## 2020-03-28 LAB — ECHOCARDIOGRAM COMPLETE
AR max vel: 1.59 cm2
AV Area VTI: 1.94 cm2
AV Area mean vel: 1.54 cm2
AV Mean grad: 6 mmHg
AV Peak grad: 10.5 mmHg
Ao pk vel: 1.62 m/s
Area-P 1/2: 4.74 cm2
Height: 63 in
S' Lateral: 3.21 cm
Weight: 2011.2 oz

## 2020-03-28 LAB — TROPONIN I (HIGH SENSITIVITY): Troponin I (High Sensitivity): 23 ng/L — ABNORMAL HIGH (ref <18)

## 2020-03-28 MED ORDER — LORAZEPAM 2 MG/ML IJ SOLN: 0.5000 mg | Freq: Once | INTRAMUSCULAR | Status: DC

## 2020-03-28 MED ORDER — POTASSIUM CHLORIDE 20 MEQ PO PACK
40.0000 meq | PACK | Freq: Once | ORAL | Status: AC
  Administered 2020-03-28: 40 meq via ORAL
  Filled 2020-03-28: qty 2

## 2020-03-28 MED ORDER — CALCIUM CARBONATE-VITAMIN D 500-200 MG-UNIT PO TABS
2.0000 | ORAL_TABLET | Freq: Every day | ORAL | Status: DC
  Administered 2020-03-28 – 2020-03-30 (×3): 2 via ORAL
  Filled 2020-03-28 (×3): qty 2

## 2020-03-28 MED ORDER — FOSFOMYCIN TROMETHAMINE 3 G PO PACK
3.0000 g | PACK | Freq: Once | ORAL | Status: AC
  Administered 2020-03-28: 3 g via ORAL
  Filled 2020-03-28: qty 3

## 2020-03-28 MED ORDER — SODIUM CHLORIDE 0.9 % IV SOLN
INTRAVENOUS | Status: DC | PRN
  Administered 2020-03-28: 500 mL via INTRAVENOUS

## 2020-03-28 MED ORDER — LORAZEPAM 2 MG/ML IJ SOLN
0.5000 mg | INTRAMUSCULAR | Status: DC | PRN
  Administered 2020-03-28: 0.5 mg via INTRAVENOUS
  Filled 2020-03-28: qty 1

## 2020-03-28 MED ORDER — POTASSIUM CHLORIDE 10 MEQ/100ML IV SOLN
10.0000 meq | INTRAVENOUS | Status: AC
  Administered 2020-03-28 (×6): 10 meq via INTRAVENOUS
  Filled 2020-03-28 (×6): qty 100

## 2020-03-28 NOTE — Progress Notes (Signed)
Pt transferred from ED to unit with cardizem gtt infusing at 7.5. Pt stable, A&O to self and place. Pt denies any pain.

## 2020-03-28 NOTE — Progress Notes (Signed)
PROGRESS NOTE    Christine Johns  Johns:[id number]:[id number] DOB: 1940-12-28 DOA: 03/27/2020 PCP: Marisue IvanLinthavong, [place], MD   Brief Narrative:  Darletta MollBetty Johns  is a 79 y.o. female with a known history of hypertension and dementia, presented to the emergency room with acute onset of syncope today.  She had a near syncope earlier that was witnessed by her husband when she went to the bathroom and was coming back and before she went down her husband caught her.  She hurt her right knee falling.  Her son-in-law gives most of the history and give her Mobic and within 10 minutes he slumped over in her chair and lost consciousness for less than a minute per his report. On presentation she was found to have new onset A. fib with RVR. Labs with profound hypokalemia with potassium less than 2 and magnesium of 1.5, BUN of 37 and creatinine of 1.8 with baseline at 1. She was started on Cardizem infusion.  Subjective: Patient was feeling better when seen today. Denies any recent diarrhea or illness. No lower abdominal pain or dysuria, not sure about urinary urgency stating that she always have that problem but I think it is little more than normal. Also has some underlying dementia with no prior diagnosis. Patient was becoming agitated at time and pulling her lines. Needs frequent redirection. Son-in-law was at bedside.  Assessment & Plan:   Active Problems:   Atrial fibrillation with rapid ventricular response (HCC)  Syncope likely secondary to new onset atrial fibrillation with rapid ventricular response. No prior diagnosis of atrial fibrillation but irregular heartbeat was noted on her recent PCP visit in September 2021. Patient has significant electrolyte abnormalities with marked hypokalemia and hypomagnesemia which can also predispose arrhythmias. Cardiology was consulted by admitting provider-we will appreciate their recommendations. CHA2DS2-VASc score of 4-she was started on Eliquis. -Continue with Cardizem infusion  and wean as tolerated. -Echocardiogram done-pending results.  Severe hypokalemia. Potassium improved to 2.5 after getting more than 100 mEq overnight. Magnesium improved to 2.5. -Replete potassium and monitor.  Hypomagnesemia. Resolved.  AKI. Creatinine peaked at 1.8 with baseline around 1. Improved to 1.26 this morning. Most likely prerenal. -Keep holding ACE inhibitors and Mobic.  Possible UTI. UA with pyuria and positive nitrites. Patient was unable to explain whole lot of urinary symptoms. Afebrile and no leukocytosis. -Urine culture ordered. -1 dose of fosfomycin-less options due to allergies.  Dyslipidemia. -Continue statin.  Question able underlying dementia. Most likely she has some underlying dementia. She was oriented to self and time but not place. Becoming at times agitated and requiring frequent redirections. According to son-in-law there is no prior diagnosis of dementia but family feels that she needs evaluation. -Advise to get evaluation by her primary care provider. -Telemetry sitter. -Delirium precautions.  Objective: Vitals:   03/28/20 0012 03/28/20 0314 03/28/20 0727 03/28/20 1130  BP: 126/69 119/72 116/62 99/62  Pulse: 82 83 67 76  Resp:  19 16 16   Temp:  98.1 F (36.7 C) 97.8 F (36.6 C) 97.9 F (36.6 C)  TempSrc:  Oral Oral Oral  SpO2:  98% 100% 96%  Weight:  57 kg    Height:  5\' 3"  (1.6 m)      Intake/Output Summary (Last 24 hours) at 03/28/2020 1352 Last data filed at 03/28/2020 1124 Gross per 24 hour  Intake 2306.43 ml  Output 650 ml  Net 1656.43 ml   Filed Weights   03/27/20 2129 03/28/20 0314  Weight: 59.9 kg 57 kg  Examination:  General exam: Frail elderly lady, appears calm and comfortable  Respiratory system: Clear to auscultation. Respiratory effort normal. Cardiovascular system: Irregularly irregular Gastrointestinal system: Soft, nontender, nondistended, bowel sounds positive. Central nervous system: Alert and oriented. No  focal neurological deficits. Extremities: Trace LE edema, no cyanosis, pulses intact and symmetrical. Psychiatry: Judgement and insight appear normal.     DVT prophylaxis: Eliquis Code Status: Full Family Communication: Son-in-law was updated at bedside and daughter on phone. Disposition Plan:  Status is: Inpatient  Remains inpatient appropriate because:Inpatient level of care appropriate due to severity of illness   Dispo: The patient is from: Home              Anticipated d/c is to: Home              Anticipated d/c date is: 1 day              Patient currently is not medically stable to d/c.   Consultants:   Cardiology  Procedures:  Antimicrobials:  Fosfomycin  Data Reviewed: I have personally reviewed following labs and imaging studies  CBC: Recent Labs  Lab 03/27/20 2135 03/28/20 0514  WBC 9.5 9.4  HGB 11.7* 10.6*  HCT 34.7* 29.9*  MCV 87.6 85.9  PLT 187 192   Basic Metabolic Panel: Recent Labs  Lab 03/27/20 2135 03/28/20 0514 03/28/20 0705  NA 134* 137  --   K <2.0* 2.5*  --   CL 99 106  --   CO2 23 21*  --   GLUCOSE 129* 118*  --   BUN 37* 28*  --   CREATININE 1.80* 1.26*  --   CALCIUM 8.0* 8.0*  --   MG 1.5*  --  2.5*   GFR: Estimated Creatinine Clearance: 29 mL/min (A) (by C-G formula based on SCr of 1.26 mg/dL (H)). Liver Function Tests: Recent Labs  Lab 03/27/20 2300  AST 54*  ALT 22  ALKPHOS 62  BILITOT 0.9  PROT 7.1  ALBUMIN 3.1*   No results for input(s): LIPASE, AMYLASE in the last 168 hours. No results for input(s): AMMONIA in the last 168 hours. Coagulation Profile: No results for input(s): INR, PROTIME in the last 168 hours. Cardiac Enzymes: No results for input(s): CKTOTAL, CKMB, CKMBINDEX, TROPONINI in the last 168 hours. BNP (last 3 results) No results for input(s): PROBNP in the last 8760 hours. HbA1C: No results for input(s): HGBA1C in the last 72 hours. CBG: No results for input(s): GLUCAP in the last 168  hours. Lipid Profile: Recent Labs    03/28/20 0514  CHOL 125  HDL 33*  LDLCALC 73  TRIG 93  CHOLHDL 3.8   Thyroid Function Tests: No results for input(s): TSH, T4TOTAL, FREET4, T3FREE, THYROIDAB in the last 72 hours. Anemia Panel: No results for input(s): VITAMINB12, FOLATE, FERRITIN, TIBC, IRON, RETICCTPCT in the last 72 hours. Sepsis Labs: No results for input(s): PROCALCITON, LATICACIDVEN in the last 168 hours.  Recent Results (from the past 240 hour(s))  Respiratory Panel by RT PCR (Flu A&B, Covid) - Nasopharyngeal Swab     Status: None   Collection Time: 03/27/20 10:54 PM   Specimen: Nasopharyngeal Swab  Result Value Ref Range Status   SARS Coronavirus 2 by RT PCR NEGATIVE NEGATIVE Final    Comment: (NOTE) SARS-CoV-2 target nucleic acids are NOT DETECTED.  The SARS-CoV-2 RNA is generally detectable in upper respiratoy specimens during the acute phase of infection. The lowest concentration of SARS-CoV-2 viral copies this assay can detect  is 131 copies/mL. A negative result does not preclude SARS-Cov-2 infection and should not be used as the sole basis for treatment or other patient management decisions. A negative result may occur with  improper specimen collection/handling, submission of specimen other than nasopharyngeal swab, presence of viral mutation(s) within the areas targeted by this assay, and inadequate number of viral copies (<131 copies/mL). A negative result must be combined with clinical observations, patient history, and epidemiological information. The expected result is Negative.  Fact Sheet for Patients:  https://www.moore.com/  Fact Sheet for Healthcare Providers:  https://www.young.biz/  This test is no t yet approved or cleared by the [place] FDA and  has been authorized for detection and/or diagnosis of SARS-CoV-2 by FDA under an Emergency Use Authorization (EUA). This EUA will remain  in effect  (meaning this test can be used) for the duration of the COVID-19 declaration under Section 564(b)(1) of the Act, 21 U.S.C. section 360bbb-3(b)(1), unless the authorization is terminated or revoked sooner.     Influenza A by PCR NEGATIVE NEGATIVE Final   Influenza B by PCR NEGATIVE NEGATIVE Final    Comment: (NOTE) The Xpert Xpress SARS-CoV-2/FLU/RSV assay is intended as an aid in  the diagnosis of influenza from Nasopharyngeal swab specimens and  should not be used as a sole basis for treatment. Nasal washings and  aspirates are unacceptable for Xpert Xpress SARS-CoV-2/FLU/RSV  testing.  Fact Sheet for Patients: https://www.moore.com/  Fact Sheet for Healthcare Providers: https://www.young.biz/  This test is not yet approved or cleared by the [place] FDA and  has been authorized for detection and/or diagnosis of SARS-CoV-2 by  FDA under an Emergency Use Authorization (EUA). This EUA will remain  in effect (meaning this test can be used) for the duration of the  Covid-19 declaration under Section 564(b)(1) of the Act, 21  U.S.C. section 360bbb-3(b)(1), unless the authorization is  terminated or revoked. Performed at [facility], [address], [place], [place] [address]      Radiology Studies: CT Head Wo Contrast  Result Date: 03/27/2020 CLINICAL DATA:  Syncope. EXAM: CT HEAD WITHOUT CONTRAST TECHNIQUE: Contiguous axial images were obtained from the base of the skull through the vertex without intravenous contrast. COMPARISON:  None. FINDINGS: Brain: There is mild cerebral atrophy with widening of the extra-axial spaces and ventricular dilatation. There are areas of decreased attenuation within the white matter tracts of the supratentorial brain, consistent with microvascular disease changes. Vascular: No hyperdense vessel or unexpected calcification. Skull: Normal. Negative for fracture or focal lesion. Sinuses/Orbits: No  acute finding. Other: None. IMPRESSION: 1. Generalized cerebral atrophy. 2. No acute intracranial abnormality. Electronically Signed   By: Aram Candela M.D.   On: 03/27/2020 22:51   DG Chest Portable 1 View  Result Date: 03/27/2020 CLINICAL DATA:  Syncope, falls EXAM: PORTABLE CHEST 1 VIEW COMPARISON:  None. FINDINGS: Streaky opacities in the left lung base, possibly reflecting atelectasis or early airspace disease. Lungs are otherwise clear. No pneumothorax or visible effusion. The aorta is calcified. The remaining cardiomediastinal contours are unremarkable. No acute osseous or soft tissue abnormality. Degenerative changes are present in the imaged spine and shoulders. Telemetry leads overlie the chest. IMPRESSION: Streaky opacities in the left lung base, favor atelectasis though early airspace disease could present similarly in the appropriate clinical context. Electronically Signed   By: Kreg Shropshire M.D.   On: 03/27/2020 21:55   DG Knee Complete 4 Views Right  Result Date: 03/27/2020 CLINICAL DATA:  Status post trauma.  EXAM: RIGHT KNEE - COMPLETE 4+ VIEW COMPARISON:  None. FINDINGS: No evidence of fracture, dislocation, or joint effusion. There is mild medial and lateral tibiofemoral compartment space narrowing. Marked severity patellofemoral narrowing is noted. Soft tissues are unremarkable. IMPRESSION: 1. No acute osseous abnormality. 2. Tricompartmental degenerative changes, most severe in the patellofemoral compartment. Electronically Signed   By: Aram Candela M.D.   On: 03/27/2020 23:06    Scheduled Meds: . amLODipine  5 mg Oral Daily  . apixaban  2.5 mg Oral BID  . calcium-vitamin D  2 tablet Oral Daily  . fosfomycin  3 g Oral Once  . multivitamin with minerals  1 tablet Oral Daily  . pravastatin  40 mg Oral q1800   Continuous Infusions: . 0.9 % NaCl with KCl 40 mEq / L 100 mL/hr at 03/28/20 1026  . diltiazem (CARDIZEM) infusion 7.5 mg/hr (03/28/20 1350)  . diltiazem  (CARDIZEM) infusion    . potassium chloride 10 mEq (03/28/20 1320)     LOS: 1 day   Time spent: 35 minutes.  Arnetha Courser, MD [facility]  If 7PM-7AM, please contact night-coverage [email or web address]  03/28/2020, 1:52 PM   This record has been created using Conservation officer, historic buildings. Errors have been sought and corrected,but may not always be located. Such creation errors do not reflect on the standard of care.

## 2020-03-28 NOTE — Plan of Care (Signed)
Pt alert and oriented to self and place. Vitals stable. HR fluctuating over night but not sustaining. HR 80s to 140s, increased with activity. Pt denies any chest pain. Magnesium and potassium supplement. Cardizem infusing at 7.5 mg/hr. Will continue to monitor.  Problem: Cardiac: Goal: Will achieve and/or maintain adequate cardiac output Outcome: Progressing   Problem: Education: Goal: Knowledge of disease or condition will improve Outcome: Progressing Goal: Understanding of medication regimen will improve Outcome: Progressing Goal: Individualized Educational Video(s) Outcome: Progressing   Problem: Activity: Goal: Ability to tolerate increased activity will improve Outcome: Progressing   Problem: Cardiac: Goal: Ability to achieve and maintain adequate cardiopulmonary perfusion will improve Outcome: Progressing   Problem: Health Behavior/Discharge Planning: Goal: Ability to safely manage health-related needs after discharge will improve Outcome: Progressing

## 2020-03-28 NOTE — Progress Notes (Signed)
*  PRELIMINARY RESULTS* Echocardiogram 2D Echocardiogram has been performed.  Christine Johns 03/28/2020, 10:02 AM

## 2020-03-28 NOTE — Progress Notes (Signed)
Pt daughter made aware that the pt will be moving to room 243.

## 2020-03-29 ENCOUNTER — Encounter: Payer: Self-pay | Admitting: Family Medicine

## 2020-03-29 LAB — BASIC METABOLIC PANEL
Anion gap: 6 (ref 5–15)
BUN: 16 mg/dL (ref 8–23)
CO2: 21 mmol/L — ABNORMAL LOW (ref 22–32)
Calcium: 8.1 mg/dL — ABNORMAL LOW (ref 8.9–10.3)
Chloride: 114 mmol/L — ABNORMAL HIGH (ref 98–111)
Creatinine, Ser: 0.78 mg/dL (ref 0.44–1.00)
GFR, Estimated: >60 mL/min (ref >60)
Glucose, Bld: 89 mg/dL (ref 70–99)
Potassium: 4.2 mmol/L (ref 3.5–5.1)
Sodium: 141 mmol/L (ref 135–145)

## 2020-03-29 MED ORDER — DILTIAZEM HCL 30 MG PO TABS
60.0000 mg | ORAL_TABLET | Freq: Three times a day (TID) | ORAL | Status: DC
  Administered 2020-03-29 – 2020-03-30 (×3): 60 mg via ORAL
  Filled 2020-03-29 (×4): qty 2

## 2020-03-29 NOTE — Progress Notes (Addendum)
Progress Note    Christine Christine  ZOX:[id number] DOB: Oct 26, 1940  DOA: 03/27/2020 PCP: Marisue Ivan, MD      Brief Narrative:    Medical records reviewed and are as summarized below:  Christine Christine is a 79 y.o. female with medical history of hypertension, probable dementia, was brought to the hospital because of syncopal episode.  Reportedly, this was preceded by a near syncope.  In the ED, she was found to have atrial fibrillation with rapid ventricular response, severe hypokalemia with potassium of less than 2 and hypomagnesemia with magnesium level 1.5.      Assessment/Plan:   Active Problems:   Atrial fibrillation with rapid ventricular response (HCC)   Syncope and collapse   Acute hypokalemia   Hypomagnesemia   Body mass index is 23.35 kg/m.   S/p syncope likely due to A. fib with RVR: She has converted to normal sinus rhythm.  No orthostatic hypotension.  Discontinue IV Cardizem infusion and start oral Cardizem.  Monitor heart rate closely.  Continue Eliquis for stroke prophylaxis.  CHA2DS2-VASc of 4.  Hypokalemia and hypomagnesemia: Improved.  Continue to monitor levels.  AKI: Resolved.  Discontinue IV fluids.  Probable UTI: Urine culture showed gram-negative rods.  Final culture result is pending.  She was given 1 dose of fosfomycin on 03/28/2020.  No fever or leukocytosis.  Intermittent confusion with questionable underlying dementia: Supportive care.  She has a Comptroller at the bedside.  Outpatient evaluation with PCP  Hyperlipidemia: Continue lovastatin  Consult PT and OT to determine disposition   Diet Order            Diet Heart Room service appropriate? Yes; Fluid consistency: Thin  Diet effective now                    Consultants:  None  Procedures:  None    Medications:   . apixaban  2.5 mg Oral BID  . calcium-vitamin D  2 tablet Oral Daily  . diltiazem  60 mg Oral Q8H  . multivitamin with minerals  1 tablet Oral Daily    . pravastatin  40 mg Oral q1800   Continuous Infusions: . sodium chloride 2.5 mL/hr at 03/29/20 0700     Anti-infectives (From admission, onward)   Start     Dose/Rate Route Frequency Ordered Stop   03/28/20 1430  fosfomycin (MONUROL) packet 3 g        3 g Oral  Once 03/28/20 1322 03/28/20 1520             Family Communication/Anticipated D/C date and plan/Code Status   DVT prophylaxis: apixaban (ELIQUIS) tablet 2.5 mg Start: 03/28/20 0000 apixaban (ELIQUIS) tablet 2.5 mg     Code Status: Full Code  Family Communication: Plan discussed with her daughter, Christine Christine Disposition Plan:    Status is: Inpatient  Remains inpatient appropriate because:Unsafe d/c plan and Inpatient level of care appropriate due to severity of illness   Dispo: The patient is from: Home              Anticipated d/c is to: SNF              Anticipated d/c date is: 2 days              Patient currently is not medically stable to d/c.           Subjective:   Interval events noted.  Patient has a Comptroller at the bedside because  of intermittent confusion multiple attempts get out of bed.  She has no complaints.  No shortness of breath, palpitation, dizziness or chest pain.  Objective:    Vitals:   03/29/20 0226 03/29/20 0359 03/29/20 0753 03/29/20 1205  BP:  118/63 128/65 135/65  Pulse:  75 77 81  Resp:  Temp:  98.5 F (36.9 C) 98.5 F (36.9 C) 98.1 F (36.7 C)  TempSrc:  Oral Oral Oral  SpO2:  98% 94% 96%  Weight: 59.8 kg     Height:       Orthostatic VS for the past 24 hrs:  BP- Lying Pulse- Lying BP- Sitting Pulse- Sitting BP- Standing at 0 minutes Pulse- Standing at 0 minutes  03/29/20 1337 111/65 82 102/87 87 148/76 90     Intake/Output Summary (Last 24 hours) at 03/29/2020 1342 Last data filed at 03/29/2020 1033 Gross per 24 hour  Intake 2963.07 ml  Output --  Net 2963.07 ml   Filed Weights   03/27/20 2129 03/28/20 0314 03/29/20 0226  Weight: 59.9 kg  57 kg 59.8 kg    Exam:  GEN: NAD SKIN: No rash EYES: EOMI ENT: MMM CV: RRR PULM: CTA B ABD: soft, ND, NT, +BS CNS: AAO x 1 (person), non focal EXT: No edema or tenderness   Data Reviewed:   I have personally reviewed following labs and imaging studies:  Labs: Labs show the following:   Basic Metabolic Panel: Recent Labs  Lab 03/27/20 2135 03/27/20 2135 03/28/20 0514 03/28/20 0514 03/28/20 0705 03/28/20 1434 03/29/20 0622  NA 134*  --  137  --   --  138 141  K <2.0*   < > 2.5*   < >  --  3.6 4.2  CL 99  --  106  --   --  108 114*  CO2 23  --  21*  --   --  20* 21*  GLUCOSE 129*  --  118*  --   --  124* 89  BUN 37*  --  28*  --   --  24* 16  CREATININE 1.80*  --  1.26*  --   --  1.10* 0.78  CALCIUM 8.0*  --  8.0*  --   --  8.0* 8.1*  MG 1.5*  --   --   --  2.5*  --   --    < > = values in this interval not displayed.   GFR Estimated Creatinine Clearance: 45.6 mL/min (by C-G formula based on SCr of 0.78 mg/dL). Liver Function Tests: Recent Labs  Lab 03/27/20 2300  AST 54*  ALT 22  ALKPHOS 62  BILITOT 0.9  PROT 7.1  ALBUMIN 3.1*   No results for input(s): LIPASE, AMYLASE in the last 168 hours. No results for input(s): AMMONIA in the last 168 hours. Coagulation profile No results for input(s): INR, PROTIME in the last 168 hours.  CBC: Recent Labs  Lab 03/27/20 2135 03/28/20 0514  WBC 9.5 9.4  HGB 11.7* 10.6*  HCT 34.7* 29.9*  MCV 87.6 85.9  PLT 187 192   Cardiac Enzymes: No results for input(s): CKTOTAL, CKMB, CKMBINDEX, TROPONINI in the last 168 hours. BNP (last 3 results) No results for input(s): PROBNP in the last 8760 hours. CBG: No results for input(s): GLUCAP in the last 168 hours. D-Dimer: No results for input(s): DDIMER in the last 72 hours. Hgb A1c: No results for input(s): HGBA1C in the last 72 hours. Lipid Profile: Recent Labs  03/28/20 0514  CHOL 125  HDL 33*  LDLCALC 73  TRIG 93  CHOLHDL 3.8   Thyroid function  studies: No results for input(s): TSH, T4TOTAL, T3FREE, THYROIDAB in the last 72 hours.  Invalid input(s): FREET3 Anemia work up: No results for input(s): VITAMINB12, FOLATE, FERRITIN, TIBC, IRON, RETICCTPCT in the last 72 hours. Sepsis Labs: Recent Labs  Lab 03/27/20 2135 03/28/20 0514  WBC 9.5 9.4    Microbiology Recent Results (from the past 240 hour(s))  Respiratory Panel by RT PCR (Flu A&B, Covid) - Nasopharyngeal Swab     Status: None   Collection Time: 03/27/20 10:54 PM   Specimen: Nasopharyngeal Swab  Result Value Ref Range Status   SARS Coronavirus 2 by RT PCR NEGATIVE NEGATIVE Final    Comment: (NOTE) SARS-CoV-2 target nucleic acids are NOT DETECTED.  The SARS-CoV-2 RNA is generally detectable in upper respiratoy specimens during the acute phase of infection. The lowest concentration of SARS-CoV-2 viral copies this assay can detect is 131 copies/mL. A negative result does not preclude SARS-Cov-2 infection and should not be used as the sole basis for treatment or other patient management decisions. A negative result may occur with  improper specimen collection/handling, submission of specimen other than nasopharyngeal swab, presence of viral mutation(s) within the areas targeted by this assay, and inadequate number of viral copies (<131 copies/mL). A negative result must be combined with clinical observations, patient history, and epidemiological information. The expected result is Negative.  Fact Sheet for Patients:  https://www.moore.com/  Fact Sheet for Healthcare Providers:  https://www.young.biz/  This test is no t yet approved or cleared by the [place] FDA and  has been authorized for detection and/or diagnosis of SARS-CoV-2 by FDA under an Emergency Use Authorization (EUA). This EUA will remain  in effect (meaning this test can be used) for the duration of the COVID-19 declaration under Section 564(b)(1) of the  Act, 21 U.S.C. section 360bbb-3(b)(1), unless the authorization is terminated or revoked sooner.     Influenza A by PCR NEGATIVE NEGATIVE Final   Influenza B by PCR NEGATIVE NEGATIVE Final    Comment: (NOTE) The Xpert Xpress SARS-CoV-2/FLU/RSV assay is intended as an aid in  the diagnosis of influenza from Nasopharyngeal swab specimens and  should not be used as a sole basis for treatment. Nasal washings and  aspirates are unacceptable for Xpert Xpress SARS-CoV-2/FLU/RSV  testing.  Fact Sheet for Patients: https://www.moore.com/  Fact Sheet for Healthcare Providers: https://www.young.biz/  This test is not yet approved or cleared by the [place] FDA and  has been authorized for detection and/or diagnosis of SARS-CoV-2 by  FDA under an Emergency Use Authorization (EUA). This EUA will remain  in effect (meaning this test can be used) for the duration of the  Covid-19 declaration under Section 564(b)(1) of the Act, 21  U.S.C. section 360bbb-3(b)(1), unless the authorization is  terminated or revoked. Performed at [facility], [address], [place], [place] [address]   Urine Culture     Status: Abnormal (Preliminary result)   Collection Time: 03/28/20  5:34 AM   Specimen: Urine, Random  Result Value Ref Range Status   Specimen Description   Final    URINE, RANDOM Performed at [facility], [address], [place], [place] [address]    Special Requests   Final    NONE Performed at [facility], [address], [place], [place] [address]    Culture (A)  Final    >=100,000 COLONIES/mL  GRAM NEGATIVE RODS SUSCEPTIBILITIES TO FOLLOW Performed at [facility], [address] [place], [place] [address]    Report Status PENDING  Incomplete    Procedures and diagnostic studies:  CT Head Wo Contrast  Result Date: 03/27/2020 CLINICAL DATA:  Syncope. EXAM: CT HEAD WITHOUT CONTRAST TECHNIQUE:  Contiguous axial images were obtained from the base of the skull through the vertex without intravenous contrast. COMPARISON:  None. FINDINGS: Brain: There is mild cerebral atrophy with widening of the extra-axial spaces and ventricular dilatation. There are areas of decreased attenuation within the white matter tracts of the supratentorial brain, consistent with microvascular disease changes. Vascular: No hyperdense vessel or unexpected calcification. Skull: Normal. Negative for fracture or focal lesion. Sinuses/Orbits: No acute finding. Other: None. IMPRESSION: 1. Generalized cerebral atrophy. 2. No acute intracranial abnormality. Electronically Signed   By: Aram Candelahaddeus  Houston M.D.   On: 03/27/2020 22:51   DG Chest Portable 1 View  Result Date: 03/27/2020 CLINICAL DATA:  Syncope, falls EXAM: PORTABLE CHEST 1 VIEW COMPARISON:  None. FINDINGS: Streaky opacities in the left lung base, possibly reflecting atelectasis or early airspace disease. Lungs are otherwise clear. No pneumothorax or visible effusion. The aorta is calcified. The remaining cardiomediastinal contours are unremarkable. No acute osseous or soft tissue abnormality. Degenerative changes are present in the imaged spine and shoulders. Telemetry leads overlie the chest. IMPRESSION: Streaky opacities in the left lung base, favor atelectasis though early airspace disease could present similarly in the appropriate clinical context. Electronically Signed   By: Kreg ShropshirePrice  DeHay M.D.   On: 03/27/2020 21:55   DG Knee Complete 4 Views Right  Result Date: 03/27/2020 CLINICAL DATA:  Status post trauma. EXAM: RIGHT KNEE - COMPLETE 4+ VIEW COMPARISON:  None. FINDINGS: No evidence of fracture, dislocation, or joint effusion. There is mild medial and lateral tibiofemoral compartment space narrowing. Marked severity patellofemoral narrowing is noted. Soft tissues are unremarkable. IMPRESSION: 1. No acute osseous abnormality. 2. Tricompartmental degenerative  changes, most severe in the patellofemoral compartment. Electronically Signed   By: Aram Candelahaddeus  Houston M.D.   On: 03/27/2020 23:06   ECHOCARDIOGRAM COMPLETE  Result Date: 03/28/2020    ECHOCARDIOGRAM REPORT   Patient Name:   Kandis MannanBETTY J Putz Date of Exam: 03/28/2020 Medical Rec #:  [id number]     Height:       63.0 in Accession #:    [id number]    Weight:       125.7 lb Date of Birth:  [birth year]     BSA:          1.587 m Patient Age:    79 years      BP:           116/62 mmHg Patient Gender: F             HR:           83 bpm. Exam Location:  [facility] Procedure: 2D Echo, Color Doppler and Cardiac Doppler Indications:     I48.91 Atrial Fibrillation  History:         Patient has no prior history of Echocardiogram examinations.                  Risk Factors:Hypertension.  Sonographer:     Humphrey RollsJoan Heiss RDCS (AE) Referring Phys:  [id number] Vernetta HoneyJAN A MANSY Diagnosing Phys: Cristal Deerhristopher End MD IMPRESSIONS  1. Left ventricular ejection fraction, by estimation, is 55 to 60%. The left ventricle has normal function. The left ventricle has no regional wall  motion abnormalities. Left ventricular diastolic parameters are indeterminate.  2. Right ventricular systolic function is normal. The right ventricular size is normal. There is mildly elevated pulmonary artery systolic pressure.  3. The mitral valve is abnormal. Mild to moderate mitral valve regurgitation. No evidence of mitral stenosis.  4. The aortic valve has an indeterminant number of cusps. There is mild calcification of the aortic valve. There is mild thickening of the aortic valve. Aortic valve regurgitation is not visualized. Mild to moderate aortic valve sclerosis/calcification is present, without any evidence of aortic stenosis.  5. The inferior vena cava is dilated in size with <50% respiratory variability, suggesting right atrial pressure of 15 mmHg. FINDINGS  Left Ventricle: Left ventricular ejection fraction, by estimation, is 55 to 60%. The left ventricle has normal  function. The left ventricle has no regional wall motion abnormalities. The left ventricular internal cavity size was normal in size. There is  no left ventricular hypertrophy. Left ventricular diastolic parameters are indeterminate. Right Ventricle: The right ventricular size is normal. No increase in right ventricular wall thickness. Right ventricular systolic function is normal. There is mildly elevated pulmonary artery systolic pressure. The tricuspid regurgitant velocity is 2.44  m/s, and with an assumed right atrial pressure of 15 mmHg, the estimated right ventricular systolic pressure is 38.8 mmHg. Left Atrium: Left atrial size was normal in size. Right Atrium: Right atrial size was not well visualized. Pericardium: There is no evidence of pericardial effusion. Presence of pericardial fat pad. Mitral Valve: The mitral valve is abnormal. There is mild calcification of the mitral valve leaflet(s). Mild mitral annular calcification. Mild to moderate mitral valve regurgitation, with posteriorly-directed jet. No evidence of mitral valve stenosis. MV peak gradient, 6.2 mmHg. The mean mitral valve gradient is 3.0 mmHg. Tricuspid Valve: The tricuspid valve is normal in structure. Tricuspid valve regurgitation is mild. Aortic Valve: The aortic valve has an indeterminant number of cusps. There is mild calcification of the aortic valve. There is mild thickening of the aortic valve. Aortic valve regurgitation is not visualized. Mild to moderate aortic valve sclerosis/calcification is present, without any evidence of aortic stenosis. Aortic valve mean gradient measures 6.0 mmHg. Aortic valve peak gradient measures 10.5 mmHg. Aortic valve area, by VTI measures 1.94 cm. Pulmonic Valve: The pulmonic valve was grossly normal. Pulmonic valve regurgitation is trivial. Aorta: The aortic root is normal in size and structure. Pulmonary Artery: The pulmonary artery is not well seen. Venous: The inferior vena cava is dilated in  size with less than 50% respiratory variability, suggesting right atrial pressure of 15 mmHg. IAS/Shunts: No atrial level shunt detected by color flow Doppler.  LEFT VENTRICLE PLAX 2D LVIDd:         4.25 cm  Diastology LVIDs:         3.21 cm  LV e' medial:    6.09 cm/s LV PW:         0.93 cm  LV E/e' medial:  18.9 LV IVS:        0.71 cm  LV e' lateral:   8.49 cm/s LVOT diam:     2.00 cm  LV E/e' lateral: 13.5 LV SV:         53 LV SV Index:   34 LVOT Area:     3.14 cm  RIGHT VENTRICLE RV Basal diam:  3.49 cm LEFT ATRIUM             Index LA diam:        3.10 cm  1.95 cm/m LA Vol (A2C):   46.2 ml 29.11 ml/m LA Vol (A4C):   28.2 ml 17.77 ml/m LA Biplane Vol: 36.3 ml 22.87 ml/m  AORTIC VALVE                    PULMONIC VALVE AV Area (Vmax):    1.59 cm     PV Vmax:       1.20 m/s AV Area (Vmean):   1.54 cm     PV Vmean:      84.500 cm/s AV Area (VTI):     1.94 cm     PV VTI:        0.219 m AV Vmax:           162.00 cm/s  PV Peak grad:  5.8 mmHg AV Vmean:          117.000 cm/s PV Mean grad:  3.0 mmHg AV VTI:            0.276 m AV Peak Grad:      10.5 mmHg AV Mean Grad:      6.0 mmHg LVOT Vmax:         82.20 cm/s LVOT Vmean:        57.500 cm/s LVOT VTI:          0.170 m LVOT/AV VTI ratio: 0.62  AORTA Ao Root diam: 3.40 cm MITRAL VALVE                TRICUSPID VALVE MV Area (PHT): 4.74 cm     TR Peak grad:   23.8 mmHg MV Peak grad:  6.2 mmHg     TR Vmax:        244.00 cm/s MV Mean grad:  3.0 mmHg MV Vmax:       1.24 m/s     SHUNTS MV Vmean:      82.4 cm/s    Systemic VTI:  0.17 m MV Decel Time: 160 msec     Systemic Diam: 2.00 cm MV E velocity: 115.00 cm/s MV A velocity: 100.90 cm/s MV E/A ratio:  1.14 Cristal Deer End MD Electronically signed by Yvonne Kendall MD Signature Date/Time: 03/28/2020/2:47:56 PM    Final                LOS: 2 days   Tamyia Minich  [facility]   Pager on [email or web address]. If 7PM-7AM, please contact night-coverage at [email or web address]     03/29/2020, 1:42 PM

## 2020-03-29 NOTE — Evaluation (Signed)
Occupational Therapy Evaluation Patient Details Name: Christine Johns MRN: [id number] DOB: 04-Feb-1941 Today's Date: 03/29/2020    History of Present Illness presented to ER secondary to near fall x2 with brief LOC; admitted for syncope work-up, likely related to afib with RVR, severe hypokalemia (now corrected)   Clinical Impression   Pt seen for OT evaluation this date in setting of acute hospitalization with E'lyte imbalances and Afib. Pt presents this date pleasant and willing to participate. Pt's daughter and a sitter are present in room throughout session. Pt's dtr reports she lives with her husband at baseline who is disabled and uses a wheelchair or completes brief distances with a cane. Pt demos ability to complete her ADLs and ADL mobility at her reported baseline of INDEP with no use of AD (although does demo improved balance with introduction of RW). Pt with one small posterior LOB in standing, but is noted to self-correct. Pt's daughter observes and confirms this appears to be her mother's functional baseline with ADLs. Pt is noted to be a poor historian and is only oriented to self. Recommend she have SUPV for fxl mobility and IADLs in the home as she has decreased safety awareness. Do not detect need for further OT in acute setting or need for f/u upon d/c.     Follow Up Recommendations  No OT follow up;Supervision - Intermittent (SUPV for IADLs d/t decreased safety awareness)    Equipment Recommendations  Tub/shower seat;Other (comment) (grab bars in shower)    Recommendations for Other Services       Precautions / Restrictions Restrictions Weight Bearing Restrictions: No      Mobility Bed Mobility Overal bed mobility: Independent                  Transfers Overall transfer level: Needs assistance Equipment used: None Transfers: Sit to/from Stand Sit to Stand: Supervision              Balance Overall balance assessment: Needs assistance Sitting-balance  support: No upper extremity supported;Feet supported Sitting balance-Leahy Scale: Normal     Standing balance support: No upper extremity supported Standing balance-Leahy Scale: Fair Standing balance comment: weight in heels, one small posterior LOB, able to self-correct. Pt does not appear to notice, demos decreased safety awareness/lack of insight into deficits                           ADL either performed or assessed with clinical judgement   ADL Overall ADL's : Independent                                             Vision Patient Visual Report: No change from baseline       Perception     Praxis      Pertinent Vitals/Pain Pain Assessment: No/denies pain     Hand Dominance     Extremity/Trunk Assessment Upper Extremity Assessment Upper Extremity Assessment: Overall WFL for tasks assessed   Lower Extremity Assessment Lower Extremity Assessment: Overall WFL for tasks assessed       Communication Communication Communication: No difficulties   Cognition Arousal/Alertness: Awake/alert Behavior During Therapy: WFL for tasks assessed/performed Overall Cognitive Status: Impaired/Different from baseline Area of Impairment: Orientation;Following commands;Safety/judgement;Problem solving;Memory                 Orientation Level:  Disoriented to;Place;Time;Situation   Memory: Decreased short-term memory Following Commands: Follows one step commands consistently Safety/Judgement: Decreased awareness of safety;Decreased awareness of deficits   Problem Solving: Slow processing;Requires verbal cues;Requires tactile cues General Comments: Pt appropriate with commands and cooperative throughout, but gives information that her daughter confirms is not accurate such pt stating "I cook for myself". Pt orietned to self only. Her daughter indicates that her cognition has been declining, but is acutely worse while in the hospital.   General  Comments       Exercises Other Exercises Other Exercises: OT engages pt and family in education re: role of OT in acute setting. Both parties with good understanding, but pt with poor carryover of new learning.   Shoulder Instructions      Home Living Family/patient expects to be discharged to:: Private residence Living Arrangements: Spouse/significant other   Type of Home: House Home Access: Stairs to enter [employer] of Steps: 3 Entrance Stairs-Rails: Can reach both Home Layout: One level               Home Equipment: None          Prior Functioning/Environment Level of Independence: Independent        Comments: Pt is questionable historian. Family indicates that pt is able to walk without AD at baseline and performs all her basic self care. States that her spouse uses w/c and cane and does most of the driving (short distances).        OT Problem List: Decreased strength;Decreased cognition;Decreased safety awareness      OT Treatment/Interventions:      OT Goals(Current goals can be found in the care plan section) Acute Rehab OT Goals Patient Stated Goal: to return home OT Goal Formulation: All assessment and education complete, DC therapy  OT Frequency:     Barriers to D/C:            Co-evaluation              AM-PAC OT "6 Clicks" Daily Activity     Outcome Measure Help from another person eating meals?: None Help from another person taking care of personal grooming?: None Help from another person toileting, which includes using toliet, bedpan, or urinal?: None Help from another person bathing (including washing, rinsing, drying)?: None Help from another person to put on and taking off regular upper body clothing?: None Help from another person to put on and taking off regular lower body clothing?: None 6 Click Score: 24   End of Session Nurse Communication: Mobility status  Activity Tolerance: Patient tolerated treatment  well Patient left: in bed;with call bell/phone within reach;with bed alarm set;with family/visitor present;with nursing/sitter in room (sitter and daughter present in room)  OT Visit Diagnosis: Muscle weakness (generalized) (M62.81)                Time: 0174-9449 OT Time Calculation (min): 38 min Charges:  OT General Charges $OT Visit: 1 Visit OT Evaluation $OT Eval Moderate Complexity: 1 Mod OT Treatments $Self Care/Home Management : 8-22 mins $Therapeutic Activity: 8-22 mins  Rejeana Brock, MS, OTR/L ascom [phone] 03/29/20, 6:45 PM

## 2020-03-29 NOTE — Evaluation (Signed)
Physical Therapy Evaluation Patient Details Name: Christine Johns MRN: [id number] DOB: June 06, 1940 Today's Date: 03/29/2020   History of Present Illness  presented to ER secondary to near fall x2 with brief LOC; admitted for syncope work-up, likely related to afib with RVR, severe hypokalemia (now corrected)  Clinical Impression  Patient resting in bed with sitter at bedside.  Alert, but oriented to self only.  Follows simple commands, very pleasant and cooperative; however, limited insight into deficits/safety needs, limited ability to recall and integrate new information.  Bilat UE/LE strength and ROM grossly symmetrical and WFL; no focal weakness appreciated.  Able to complete bed mobility with indep; sit/stand, basic transfers and gait (200') without assist device, cga/close sup. Demonstrates increased lateral sway bilat, intermittent staggering (relying on LE step strategy, bilat UE support) for balance recovery.  Additional gait trial completed with RW (200'), sup, with noted improvement in gait symmetry, fluidity, safety and cadence/gait speed.  Do recommend continued use of RW for optimal safety with all functional mobility at this time. Of note, vitals stable and WFL throughout session; no pre-syncopal symptoms noted or reported. Would benefit from skilled PT to address above deficits and promote optimal return to PLOF.; Recommend transition to HHPT upon discharge from acute hospitalization.     Follow Up Recommendations Home health PT    Equipment Recommendations  Rolling walker with 5" wheels    Recommendations for Other Services       Precautions / Restrictions Precautions Precautions: None Restrictions Weight Bearing Restrictions: No      Mobility  Bed Mobility Overal bed mobility: Independent                  Transfers Overall transfer level: Needs assistance Equipment used: None Transfers: Sit to/from Stand Sit to Stand: Supervision;Min guard             Ambulation/Gait Ambulation/Gait assistance: Min guard Gait Distance (Feet): 200 Feet Assistive device: None   Gait velocity: 10' walk time, 8-9 seconds   General Gait Details: increased lateral sway bilat, intermittent staggering (relying on LE step strategy, bilat UE support) for balance recovery.  Limited insight into deficits and associated fall risk.  Stairs            Wheelchair Mobility    Modified Rankin (Stroke Patients Only)       Balance Overall balance assessment: Needs assistance Sitting-balance support: No upper extremity supported;Feet supported Sitting balance-Leahy Scale: Normal     Standing balance support: No upper extremity supported Standing balance-Leahy Scale: Fair                               Pertinent Vitals/Pain Pain Assessment: No/denies pain    Home Living Family/patient expects to be discharged to:: Private residence Living Arrangements: Spouse/significant other   Type of Home: House Home Access: Stairs to enter Entrance Stairs-Rails: Can reach both Entrance Stairs-Number of Steps: 3 Home Layout: One level Home Equipment: None      Prior Function Level of Independence: Independent         Comments: Indep without assist device for ADLs, household mobilization.  Patient questionable historian; will verify with family as available.     Hand Dominance        Extremity/Trunk Assessment   Upper Extremity Assessment Upper Extremity Assessment: Overall WFL for tasks assessed    Lower Extremity Assessment Lower Extremity Assessment: Overall WFL for tasks assessed  Communication   Communication: No difficulties  Cognition Arousal/Alertness: Awake/alert Behavior During Therapy: WFL for tasks assessed/performed Overall Cognitive Status: No family/caregiver present to determine baseline cognitive functioning                                 General Comments: oriented to self only; follows  commands, pleasant and cooperative; limited insight into deficits and overall safety needs; limited ability to integrate new education      General Comments      Exercises Other Exercises Other Exercises: 200' with RW, close sup-improved gait symmetry and fluidity; improved cadence and overall gait speed.  10' walk time, 6-7 seconds, indicating increased safety/efficiency with use of RW.   Assessment/Plan    PT Assessment Patient needs continued PT services  PT Problem List Decreased activity tolerance;Decreased balance;Decreased mobility       PT Treatment Interventions DME instruction;Gait training;Stair training;Functional mobility training;Therapeutic activities;Therapeutic exercise;Balance training;Cognitive remediation    PT Goals (Current goals can be found in the Care Plan section)  Acute Rehab PT Goals Patient Stated Goal: to return home PT Goal Formulation: With patient Time For Goal Achievement: 04/12/20 Potential to Achieve Goals: Good    Frequency Min 2X/week   Barriers to discharge Decreased caregiver support      Co-evaluation               AM-PAC PT "6 Clicks" Mobility  Outcome Measure Help needed turning from your back to your side while in a flat bed without using bedrails?: None Help needed moving from lying on your back to sitting on the side of a flat bed without using bedrails?: None Help needed moving to and from a bed to a chair (including a wheelchair)?: A Little Help needed standing up from a chair using your arms (e.g., wheelchair or bedside chair)?: A Little Help needed to walk in hospital room?: A Little Help needed climbing 3-5 steps with a railing? : A Little 6 Click Score: 20    End of Session Equipment Utilized During Treatment: Gait belt Activity Tolerance: Patient tolerated treatment well Patient left: in bed;with call bell/phone within reach;with bed alarm set;with nursing/sitter in room Nurse Communication: Mobility status PT  Visit Diagnosis: Muscle weakness (generalized) (M62.81);Difficulty in walking, not elsewhere classified (R26.2)    Time: 2130-8657 PT Time Calculation (min) (ACUTE ONLY): 16 min   Charges:   PT Evaluation $PT Eval Low Complexity: 1 Low PT Treatments $Gait Training: 8-22 mins        Darroll Bredeson H. Manson Passey, PT, DPT, NCS 03/29/20, 4:13 PM [phone]

## 2020-03-30 LAB — URINE CULTURE: Culture: >=100000 — AB

## 2020-03-30 MED ORDER — DILTIAZEM HCL ER COATED BEADS 240 MG PO CP24
240.0000 mg | ORAL_CAPSULE | Freq: Every day | ORAL | 0 refills | Status: DC
Start: 2020-03-31 — End: 2023-05-01

## 2020-03-30 MED ORDER — APIXABAN 2.5 MG PO TABS
2.5000 mg | ORAL_TABLET | Freq: Two times a day (BID) | ORAL | 0 refills | Status: DC
Start: 2020-03-30 — End: 2023-05-01

## 2020-03-30 MED ORDER — DILTIAZEM HCL 30 MG PO TABS: 60.0000 mg | ORAL_TABLET | Freq: Three times a day (TID) | ORAL | Status: DC

## 2020-03-30 MED ORDER — DILTIAZEM HCL ER COATED BEADS 120 MG PO CP24
240.0000 mg | ORAL_CAPSULE | Freq: Every day | ORAL | Status: DC
  Administered 2020-03-30: 240 mg via ORAL
  Filled 2020-03-30: qty 2

## 2020-03-30 NOTE — TOC Initial Note (Addendum)
Transition of Care [facility]) - Initial/Assessment Note    Patient Details  Name: Christine Johns MRN: [id number] Date of Birth: March 28, 1941  Transition of Care [facility]) CM/SW Contact:    Maree Krabbe, LCSW Phone Number: 03/30/2020, 12:03 PM  Clinical Narrative:     CSW spoke with pt, pt's daughter, and pt's son in law at bedside. Pt is only alert to person and place. Pt's son in law pulled CSW to the side stating yes pt needs HH. Pt lives with her spouse and he is currently in the hospital. Pt's daughter and son in law live in [place]. Pt's family have no preference on agency--allowed CSW to see who could service pt. Pt's daughter also accepting walker and shower stool --recommendations. CSW will reach out to [facility] agencies to determine who can service pt.            Rosey Bath with [facility] will service pt.  Expected Discharge Plan: Home w Home Health Services Barriers to Discharge: Continued Medical Work up   Patient Goals and CMS Choice Patient states their goals for this hospitalization and ongoing recovery are:: for pt to get better   Choice offered to / list presented to : Adult Children  Expected Discharge Plan and Services Expected Discharge Plan: Home w Home Health Services In-house Referral: NA   Post Acute Care Choice: Home Health Living arrangements for the past 2 months: Single Family Home                 DME Arranged: Dan Humphreys, Shower stool DME Agency: [employer] Date DME Agency Contacted: 03/30/20 Time DME Agency Contacted: [phone] Representative spoke with at DME Agency: patricia HH Arranged: PT, OT, Social Work [employer] Agency: [facility] [employer] [facility] [employer] Date HH Agency Contacted: 03/30/20 Time HH Agency Contacted: [phone] Representative spoke with at [facility] Agency: teresa  Prior Living Arrangements/Services Living arrangements for the past 2 months: Single Family Home Lives with:: Spouse Patient language and need for interpreter reviewed:: Yes Do you feel safe  going back to the place where you live?: Yes      Need for Family Participation in Patient Care: Yes (Comment) Care giver support system in place?: Yes (comment)   Criminal Activity/Legal Involvement Pertinent to Current Situation/Hospitalization: No - Comment as needed  Activities of Daily Living Home Assistive Devices/Equipment: None ADL Screening (condition at time of admission) Patient's cognitive ability adequate to safely complete daily activities?: Yes Is the patient deaf or have difficulty hearing?: No Does the patient have difficulty seeing, even when wearing glasses/contacts?: No Does the patient have difficulty concentrating, remembering, or making decisions?: Yes Patient able to express need for assistance with ADLs?: Yes Does the patient have difficulty dressing or bathing?: No Independently performs ADLs?: Yes (appropriate for developmental age) Does the patient have difficulty walking or climbing stairs?: No Weakness of Legs: None Weakness of Arms/Hands: None  Permission Sought/Granted Permission sought to share information with : Family Supports    Share Information with NAME: Maudie Mercury     Permission granted to share info w Relationship: daughter     Emotional Assessment Appearance:: Appears stated age Attitude/Demeanor/Rapport: Unable to Assess Affect (typically observed): Unable to Assess Orientation: : Oriented to Self, Oriented to Place Alcohol / Substance Use: Not Applicable Psych Involvement: No (comment)  Admission diagnosis:  Syncope and collapse [R55] Acute hypokalemia [E87.6] Hypomagnesemia [E83.42] Atrial fibrillation with rapid ventricular response (HCC) [I48.91] Patient Active Problem List   Diagnosis Date Noted  . Syncope and collapse   .  Acute hypokalemia   . Hypomagnesemia   . Atrial fibrillation with rapid ventricular response (HCC) 03/27/2020   PCP:  Marisue Ivan, MD Pharmacy:  No Pharmacies Listed    Social Determinants of  Health (SDOH) Interventions    Readmission Risk Interventions No flowsheet data found.

## 2020-03-30 NOTE — Progress Notes (Signed)
Responded to consult for IV. Daughter present. States pt just fell asleep and has a hard time going back to sleep when awakened. Discussed IV access needs with nurse. Pt does not have any IV medications due at present. Nurse okay with holding on IV access for now; VAST available for any further needs.

## 2020-03-30 NOTE — Care Management Important Message (Signed)
Important Message  Patient Details  Name: Christine Johns MRN: [id number] Date of Birth: Aug 19, 1940   Medicare Important Message Given:  Yes     [name] 03/30/2020, 1:36 PM

## 2020-03-30 NOTE — Progress Notes (Addendum)
Kandis Mannan to be D/C'd Home per MD order.  Discussed prescriptions and follow up appointments with the patient. Prescriptions given to patients daughter medication list explained in detail. Pt daughter verbalized understanding. eliquis coupon  given to patients daughter  Allergies as of 03/30/2020      Reactions   Atorvastatin Other (See Comments)   Penicillins Itching, Swelling   Sulfa Antibiotics Itching, Swelling      Medication List    STOP taking these medications   amLODipine 5 MG tablet Commonly known as: NORVASC     TAKE these medications   apixaban 2.5 MG Tabs tablet Commonly known as: ELIQUIS Take 1 tablet (2.5 mg total) by mouth 2 (two) times daily.   Calcium Carbonate-Vitamin D 600-400 MG-UNIT tablet Take 2 tablets by mouth daily.   diltiazem 240 MG 24 hr capsule Commonly known as: CARDIZEM CD Take 1 capsule (240 mg total) by mouth daily. Start taking on: March 31, 2020   lisinopril 40 MG tablet Commonly known as: ZESTRIL Take 40 mg by mouth daily.   lovastatin 20 MG tablet Commonly known as: MEVACOR Take 30 mg by mouth at bedtime.   meloxicam 15 MG tablet Commonly known as: MOBIC Take 15 mg by mouth daily.   Multi-Vitamin tablet Take 1 tablet by mouth daily.            Durable Medical Equipment  (From admission, onward)         Start     Ordered   03/30/20 0000  For home use only DME Walker rolling       Question Answer Comment  Walker: With 5 Inch Wheels   Patient needs a walker to treat with the following condition Debility      03/30/20 1214          Vitals:   03/30/20 0903 03/30/20 1222  BP: 139/80 (!) 146/75  Pulse: 85 90  Resp: 20 20  Temp: 98 F (36.7 C) 98.8 F (37.1 C)  SpO2: 96% 96%    Skin clean, dry and intact without evidence of skin break down, no evidence of skin tears noted. IV catheter discontinued intact. Site without signs and symptoms of complications. Dressing and pressure applied. Pt denies pain at  this time. No complaints noted.  An After Visit Summary was printed and given to the patient. Patient escorted via WC, and D/C home via private auto.  Cari Burgo Marylou Flesher

## 2020-03-30 NOTE — Discharge Summary (Signed)
Physician Discharge Summary  Christine Johns WJX:[id number] DOB: Oct 17, 1940 DOA: 03/27/2020  PCP: Marisue Ivan, MD  Admit date: 03/27/2020 Discharge date: 03/30/2020  Discharge disposition: Home with home health therapy   Recommendations for Outpatient Follow-Up:   Follow-up with PCP in 1 week Follow-up with cardiologist, Dr. Lady Gary, in 1 week Follow-up in A. fib clinic as scheduled   Discharge Diagnosis:   Principal Problem:   Atrial fibrillation with rapid ventricular response (HCC) Active Problems:   Syncope and collapse   Acute hypokalemia   Hypomagnesemia    Discharge Condition: Stable.  Diet recommendation:  Diet Order            Diet - low sodium heart healthy           Diet Heart Room service appropriate? Yes; Fluid consistency: Thin  Diet effective now                   Code Status: Full Code     Hospital Course:   Ms. Christine Johns is a 79 y.o. female with medical history of hypertension, probable dementia, was brought to the hospital because of syncopal episode.  Reportedly, this was preceded by a near syncope.  In the ED, she was found to have atrial fibrillation with rapid ventricular response, severe hypokalemia with potassium of less than 2 and hypomagnesemia with magnesium level 1.5.  She was admitted to progressive care unit.  A. fib with RVR was treated with IV Cardizem infusion.  She converted to normal sinus rhythm and she was switched to oral Cardizem.  Later on, she was going in and out of atrial fibrillation but overall heart rate was better.  She was also started on Eliquis for stroke prophylaxis.  Chest wall scar was performed.  She and her family understand as well as the risk of bleeding with long-term anticoagulation.  Hypokalemia and hypomagnesemia were successfully treated.  She had AKI which resolved with IV fluids.   She had intermittent confusion during hospitalization and this was attributed to delirium.  Her daughter said  that they suspect she has underlying dementia because of cognitive decline and memory impairment over several months.  There was a question of whether patient had UTI.  She had received 1 dose of fosfomycin.  Urine culture showed Klebsiella pneumonia.  The patient did not have any symptoms referable to the urinary tract and she did not have any fever or leukocytosis.  It was presumed that patient had asymptomatic bacteriuria.   She was evaluated by PT and OT recommended home health therapy.  Her condition has improved and her mental status is back to baseline.  She is deemed stable for discharge to home today.  Discharge plan was discussed with the patient, her daughter and son-in-law.   Discharge Exam:    Vitals:   03/30/20 0058 03/30/20 0605 03/30/20 0903 03/30/20 1222  BP: (!) 146/78 131/70 139/80 (!) 146/75  Pulse: 96 86 85 90  Resp: Temp: 98.4 F (36.9 C) 98.9 F (37.2 C) 98 F (36.7 C) 98.8 F (37.1 C)  TempSrc: Oral  Oral Oral  SpO2:  95% 96% 96%  Weight:      Height:         GEN: NAD SKIN: No rash EYES: EOMI ENT: MMM CV: RRR PULM: CTA B ABD: soft, ND, NT, +BS CNS: AAO x 3, non focal EXT: No edema or tenderness   The results of significant diagnostics from this hospitalization (  including imaging, microbiology, ancillary and laboratory) are listed below for reference.     Procedures and Diagnostic Studies:   ECHOCARDIOGRAM COMPLETE  Result Date: 03/28/2020    ECHOCARDIOGRAM REPORT   Patient Name:   Christine Johns Date of Exam: 03/28/2020 Medical Rec #:  [id number]     Height:       63.0 in Accession #:    [id number]    Weight:       125.7 lb Date of Birth:  25-Jul-1940     BSA:          1.587 m Patient Age:    79 years      BP:           116/62 mmHg Patient Gender: F             HR:           83 bpm. Exam Location:  [facility] Procedure: 2D Echo, Color Doppler and Cardiac Doppler Indications:     I48.91 Atrial Fibrillation  History:         Patient has no prior  history of Echocardiogram examinations.                  Risk Factors:Hypertension.  Sonographer:     Humphrey Rolls RDCS (AE) Referring Phys:  [id number] Vernetta Honey MANSY Diagnosing Phys: Cristal Deer End MD IMPRESSIONS  1. Left ventricular ejection fraction, by estimation, is 55 to 60%. The left ventricle has normal function. The left ventricle has no regional wall motion abnormalities. Left ventricular diastolic parameters are indeterminate.  2. Right ventricular systolic function is normal. The right ventricular size is normal. There is mildly elevated pulmonary artery systolic pressure.  3. The mitral valve is abnormal. Mild to moderate mitral valve regurgitation. No evidence of mitral stenosis.  4. The aortic valve has an indeterminant number of cusps. There is mild calcification of the aortic valve. There is mild thickening of the aortic valve. Aortic valve regurgitation is not visualized. Mild to moderate aortic valve sclerosis/calcification is present, without any evidence of aortic stenosis.  5. The inferior vena cava is dilated in size with <50% respiratory variability, suggesting right atrial pressure of 15 mmHg. FINDINGS  Left Ventricle: Left ventricular ejection fraction, by estimation, is 55 to 60%. The left ventricle has normal function. The left ventricle has no regional wall motion abnormalities. The left ventricular internal cavity size was normal in size. There is  no left ventricular hypertrophy. Left ventricular diastolic parameters are indeterminate. Right Ventricle: The right ventricular size is normal. No increase in right ventricular wall thickness. Right ventricular systolic function is normal. There is mildly elevated pulmonary artery systolic pressure. The tricuspid regurgitant velocity is 2.44  m/s, and with an assumed right atrial pressure of 15 mmHg, the estimated right ventricular systolic pressure is 38.8 mmHg. Left Atrium: Left atrial size was normal in size. Right Atrium: Right atrial size was  not well visualized. Pericardium: There is no evidence of pericardial effusion. Presence of pericardial fat pad. Mitral Valve: The mitral valve is abnormal. There is mild calcification of the mitral valve leaflet(s). Mild mitral annular calcification. Mild to moderate mitral valve regurgitation, with posteriorly-directed jet. No evidence of mitral valve stenosis. MV peak gradient, 6.2 mmHg. The mean mitral valve gradient is 3.0 mmHg. Tricuspid Valve: The tricuspid valve is normal in structure. Tricuspid valve regurgitation is mild. Aortic Valve: The aortic valve has an indeterminant number of cusps. There is mild calcification of the aortic valve. There is  mild thickening of the aortic valve. Aortic valve regurgitation is not visualized. Mild to moderate aortic valve sclerosis/calcification is present, without any evidence of aortic stenosis. Aortic valve mean gradient measures 6.0 mmHg. Aortic valve peak gradient measures 10.5 mmHg. Aortic valve area, by VTI measures 1.94 cm. Pulmonic Valve: The pulmonic valve was grossly normal. Pulmonic valve regurgitation is trivial. Aorta: The aortic root is normal in size and structure. Pulmonary Artery: The pulmonary artery is not well seen. Venous: The inferior vena cava is dilated in size with less than 50% respiratory variability, suggesting right atrial pressure of 15 mmHg. IAS/Shunts: No atrial level shunt detected by color flow Doppler.  LEFT VENTRICLE PLAX 2D LVIDd:         4.25 cm  Diastology LVIDs:         3.21 cm  LV e' medial:    6.09 cm/s LV PW:         0.93 cm  LV E/e' medial:  18.9 LV IVS:        0.71 cm  LV e' lateral:   8.49 cm/s LVOT diam:     2.00 cm  LV E/e' lateral: 13.5 LV SV:         53 LV SV Index:   34 LVOT Area:     3.14 cm  RIGHT VENTRICLE RV Basal diam:  3.49 cm LEFT ATRIUM             Index LA diam:        3.10 cm 1.95 cm/m LA Vol (A2C):   46.2 ml 29.11 ml/m LA Vol (A4C):   28.2 ml 17.77 ml/m LA Biplane Vol: 36.3 ml 22.87 ml/m  AORTIC VALVE                     PULMONIC VALVE AV Area (Vmax):    1.59 cm     PV Vmax:       1.20 m/s AV Area (Vmean):   1.54 cm     PV Vmean:      84.500 cm/s AV Area (VTI):     1.94 cm     PV VTI:        0.219 m AV Vmax:           162.00 cm/s  PV Peak grad:  5.8 mmHg AV Vmean:          117.000 cm/s PV Mean grad:  3.0 mmHg AV VTI:            0.276 m AV Peak Grad:      10.5 mmHg AV Mean Grad:      6.0 mmHg LVOT Vmax:         82.20 cm/s LVOT Vmean:        57.500 cm/s LVOT VTI:          0.170 m LVOT/AV VTI ratio: 0.62  AORTA Ao Root diam: 3.40 cm MITRAL VALVE                TRICUSPID VALVE MV Area (PHT): 4.74 cm     TR Peak grad:   23.8 mmHg MV Peak grad:  6.2 mmHg     TR Vmax:        244.00 cm/s MV Mean grad:  3.0 mmHg MV Vmax:       1.24 m/s     SHUNTS MV Vmean:      82.4 cm/s    Systemic VTI:  0.17 m MV Decel Time: 160 msec  Systemic Diam: 2.00 cm MV E velocity: 115.00 cm/s MV A velocity: 100.90 cm/s MV E/A ratio:  1.14 Cristal Deer End MD Electronically signed by Yvonne Kendall MD Signature Date/Time: 03/28/2020/2:47:56 PM    Final      Labs:   Basic Metabolic Panel: Recent Labs  Lab 03/27/20 2135 03/27/20 2135 03/28/20 9604 03/28/20 0514 03/28/20 0705 03/28/20 1434 03/29/20 0622  NA 134*  --  137  --   --  138 141  K <2.0*   < > 2.5*   < >  --  3.6 4.2  CL 99  --  106  --   --  108 114*  CO2 23  --  21*  --   --  20* 21*  GLUCOSE 129*  --  118*  --   --  124* 89  BUN 37*  --  28*  --   --  24* 16  CREATININE 1.80*  --  1.26*  --   --  1.10* 0.78  CALCIUM 8.0*  --  8.0*  --   --  8.0* 8.1*  MG 1.5*  --   --   --  2.5*  --   --    < > = values in this interval not displayed.   GFR Estimated Creatinine Clearance: 45.6 mL/min (by C-G formula based on SCr of 0.78 mg/dL). Liver Function Tests: Recent Labs  Lab 03/27/20 2300  AST 54*  ALT 22  ALKPHOS 62  BILITOT 0.9  PROT 7.1  ALBUMIN 3.1*   No results for input(s): LIPASE, AMYLASE in the last 168 hours. No results for input(s): AMMONIA  in the last 168 hours. Coagulation profile No results for input(s): INR, PROTIME in the last 168 hours.  CBC: Recent Labs  Lab 03/27/20 2135 03/28/20 0514  WBC 9.5 9.4  HGB 11.7* 10.6*  HCT 34.7* 29.9*  MCV 87.6 85.9  PLT 187 192   Cardiac Enzymes: No results for input(s): CKTOTAL, CKMB, CKMBINDEX, TROPONINI in the last 168 hours. BNP: Invalid input(s): POCBNP CBG: No results for input(s): GLUCAP in the last 168 hours. D-Dimer No results for input(s): DDIMER in the last 72 hours. Hgb A1c No results for input(s): HGBA1C in the last 72 hours. Lipid Profile Recent Labs    03/28/20 0514  CHOL 125  HDL 33*  LDLCALC 73  TRIG 93  CHOLHDL 3.8   Thyroid function studies No results for input(s): TSH, T4TOTAL, T3FREE, THYROIDAB in the last 72 hours.  Invalid input(s): FREET3 Anemia work up No results for input(s): VITAMINB12, FOLATE, FERRITIN, TIBC, IRON, RETICCTPCT in the last 72 hours. Microbiology Recent Results (from the past 240 hour(s))  Respiratory Panel by RT PCR (Flu A&B, Covid) - Nasopharyngeal Swab     Status: None   Collection Time: 03/27/20 10:54 PM   Specimen: Nasopharyngeal Swab  Result Value Ref Range Status   SARS Coronavirus 2 by RT PCR NEGATIVE NEGATIVE Final    Comment: (NOTE) SARS-CoV-2 target nucleic acids are NOT DETECTED.  The SARS-CoV-2 RNA is generally detectable in upper respiratoy specimens during the acute phase of infection. The lowest concentration of SARS-CoV-2 viral copies this assay can detect is 131 copies/mL. A negative result does not preclude SARS-Cov-2 infection and should not be used as the sole basis for treatment or other patient management decisions. A negative result may occur with  improper specimen collection/handling, submission of specimen other than nasopharyngeal swab, presence of viral mutation(s) within the areas targeted by this assay, and inadequate number  of viral copies (<131 copies/mL). A negative result must be  combined with clinical observations, patient history, and epidemiological information. The expected result is Negative.  Fact Sheet for Patients:  https://www.moore.com/  Fact Sheet for Healthcare Providers:  https://www.young.biz/  This test is no t yet approved or cleared by the [place] FDA and  has been authorized for detection and/or diagnosis of SARS-CoV-2 by FDA under an Emergency Use Authorization (EUA). This EUA will remain  in effect (meaning this test can be used) for the duration of the COVID-19 declaration under Section 564(b)(1) of the Act, 21 U.S.C. section 360bbb-3(b)(1), unless the authorization is terminated or revoked sooner.     Influenza A by PCR NEGATIVE NEGATIVE Final   Influenza B by PCR NEGATIVE NEGATIVE Final    Comment: (NOTE) The Xpert Xpress SARS-CoV-2/FLU/RSV assay is intended as an aid in  the diagnosis of influenza from Nasopharyngeal swab specimens and  should not be used as a sole basis for treatment. Nasal washings and  aspirates are unacceptable for Xpert Xpress SARS-CoV-2/FLU/RSV  testing.  Fact Sheet for Patients: https://www.moore.com/  Fact Sheet for Healthcare Providers: https://www.young.biz/  This test is not yet approved or cleared by the [place] FDA and  has been authorized for detection and/or diagnosis of SARS-CoV-2 by  FDA under an Emergency Use Authorization (EUA). This EUA will remain  in effect (meaning this test can be used) for the duration of the  Covid-19 declaration under Section 564(b)(1) of the Act, 21  U.S.C. section 360bbb-3(b)(1), unless the authorization is  terminated or revoked. Performed at [facility], [address] [place], [place] [address]   Urine Culture     Status: Abnormal   Collection Time: 03/28/20  5:34 AM   Specimen: Urine, Random  Result Value Ref Range Status   Specimen Description   Final     URINE, RANDOM Performed at [facility], [address], [place], [place] [address]    Special Requests   Final    NONE Performed at [facility], [address] [place], [place] [address]    Culture >=100,000 COLONIES/mL KLEBSIELLA PNEUMONIAE (A)  Final   Report Status 03/30/2020 FINAL  Final   Organism ID, Bacteria KLEBSIELLA PNEUMONIAE (A)  Final      Susceptibility   Klebsiella pneumoniae - MIC*    AMPICILLIN RESISTANT Resistant     CEFAZOLIN <=4 SENSITIVE Sensitive     CEFTRIAXONE <=0.25 SENSITIVE Sensitive     CIPROFLOXACIN <=0.25 SENSITIVE Sensitive     GENTAMICIN <=1 SENSITIVE Sensitive     IMIPENEM <=0.25 SENSITIVE Sensitive     NITROFURANTOIN 128 RESISTANT Resistant     TRIMETH/SULFA <=20 SENSITIVE Sensitive     AMPICILLIN/SULBACTAM <=2 SENSITIVE Sensitive     PIP/TAZO <=4 SENSITIVE Sensitive     * >=100,000 COLONIES/mL KLEBSIELLA PNEUMONIAE     Discharge Instructions:   Discharge Instructions    Amb referral to AFIB Clinic   Complete by: As directed    Diet - low sodium heart healthy   Complete by: As directed    Face-to-face encounter (required for Medicare/Medicaid patients)   Complete by: As directed    I Erron Wengert certify that this patient is under my care and that I, or a nurse practitioner or physician's assistant working with me, had a face-to-face encounter that meets the physician face-to-face encounter requirements with this patient on 03/30/2020. The encounter with the patient was in whole, or in part for the following medical condition(s)  which is the primary reason for home health care (List medical condition): Debility   The encounter with the patient was in whole, or in part, for the following medical condition, which is the primary reason for home health care: Debility   I certify that, based on my findings, the following services are medically necessary home health services: Physical therapy   Reason for Medically Necessary  Home Health Services: Therapy- Investment banker, operationalGait Training, Patent examinerTransfer Training and Stair Training   My clinical findings support the need for the above services: Unable to leave home safely without assistance and/or assistive device   Further, I certify that my clinical findings support that this patient is homebound due to: Unable to leave home safely without assistance   For home use only DME Walker rolling   Complete by: As directed    Walker: With 5 Inch Wheels   Patient needs a walker to treat with the following condition: Debility   Home Health   Complete by: As directed    To provide the following care/treatments:  PT Social work     Paediatric nursehower chair   Complete by: As directed      Allergies as of 03/30/2020      Reactions   Atorvastatin Other (See Comments)   Penicillins Itching, Swelling   Sulfa Antibiotics Itching, Swelling      Medication List    STOP taking these medications   amLODipine 5 MG tablet Commonly known as: NORVASC     TAKE these medications   apixaban 2.5 MG Tabs tablet Commonly known as: ELIQUIS Take 1 tablet (2.5 mg total) by mouth 2 (two) times daily.   Calcium Carbonate-Vitamin D 600-400 MG-UNIT tablet Take 2 tablets by mouth daily.   diltiazem 240 MG 24 hr capsule Commonly known as: CARDIZEM CD Take 1 capsule (240 mg total) by mouth daily. Start taking on: March 31, 2020   lisinopril 40 MG tablet Commonly known as: ZESTRIL Take 40 mg by mouth daily.   lovastatin 20 MG tablet Commonly known as: MEVACOR Take 30 mg by mouth at bedtime.   meloxicam 15 MG tablet Commonly known as: MOBIC Take 15 mg by mouth daily.   Multi-Vitamin tablet Take 1 tablet by mouth daily.            Durable Medical Equipment  (From admission, onward)         Start     Ordered   03/30/20 0000  For home use only DME Walker rolling       Question Answer Comment  Walker: With 5 Inch Wheels   Patient needs a walker to treat with the following condition Debility        03/30/20 1214          Follow-up Information    Fath, Darlin PriestlyKenneth A, MD. Schedule an appointment as soon as possible for a visit in 1 week(s).   Specialty: Cardiology Contact information: [address] [place] [id number] [phone]                Time coordinating discharge: 33 minutes  Signed:  Lurene ShadowBERNARD [place]  [facility] 03/30/2020, 4:58 PM   Pager on [email or web address]. If 7PM-7AM, please contact night-coverage at [email or web address]

## 2020-03-30 NOTE — TOC Transition Note (Signed)
Transition of Care [facility]) - CM/SW Discharge Note   Patient Details  Name: Christine Johns MRN: [id number] Date of Birth: June 17, 1940  [facility] of [facility]) CM/SW Contact:  Maree Krabbe, LCSW Phone Number: 03/30/2020, 12:48 PM   Clinical Narrative:   Per [employer] rep pt declined shower chair, pt accepted rolling walker. Rolling walker delivered to bedside. [facility] will service pt - PT and SW. Orders entered. Pt's family will transport pt home. No additional needs at this time.    Final next level of care: Home w Home Health Services Barriers to Discharge: No Barriers Identified   Patient Goals and CMS Choice Patient states their goals for this hospitalization and ongoing recovery are:: for pt to get better   Choice offered to / list presented to : Adult Children  Discharge Placement                Patient to be transferred to facility by: Family at bedside to transfer home--daughter Name of family member notified: daughter at bedside Patient and family notified of of transfer: 03/30/20  Discharge Plan and Services In-house Referral: NA   Post Acute Care Choice: Home Health          DME Arranged: Dan Humphreys rolling DME Agency: [employer] Date DME Agency Contacted: 03/30/20 Time DME Agency Contacted: 1201 Representative spoke with at DME Agency: patricia HH Arranged: PT, Social Work [employer] Agency: [facility] [employer] [facility] [employer] Date HH Agency Contacted: 03/30/20 Time HH Agency Contacted: 1202 Representative spoke with at [facility] Agency: teresa  Social Determinants of Health (SDOH) Interventions     Readmission Risk Interventions No flowsheet data found.

## 2020-04-05 DIAGNOSIS — R4189 Other symptoms and signs involving cognitive functions and awareness: Secondary | ICD-10-CM | POA: Insufficient documentation

## 2021-09-04 IMAGING — DX DG KNEE COMPLETE 4+V*R*
4 series · 4 of 4 positions shown · non-contrast
Comparison: None.

CLINICAL DATA: Status post trauma.

EXAM:
RIGHT KNEE - COMPLETE 4+ VIEW

[knee ap]
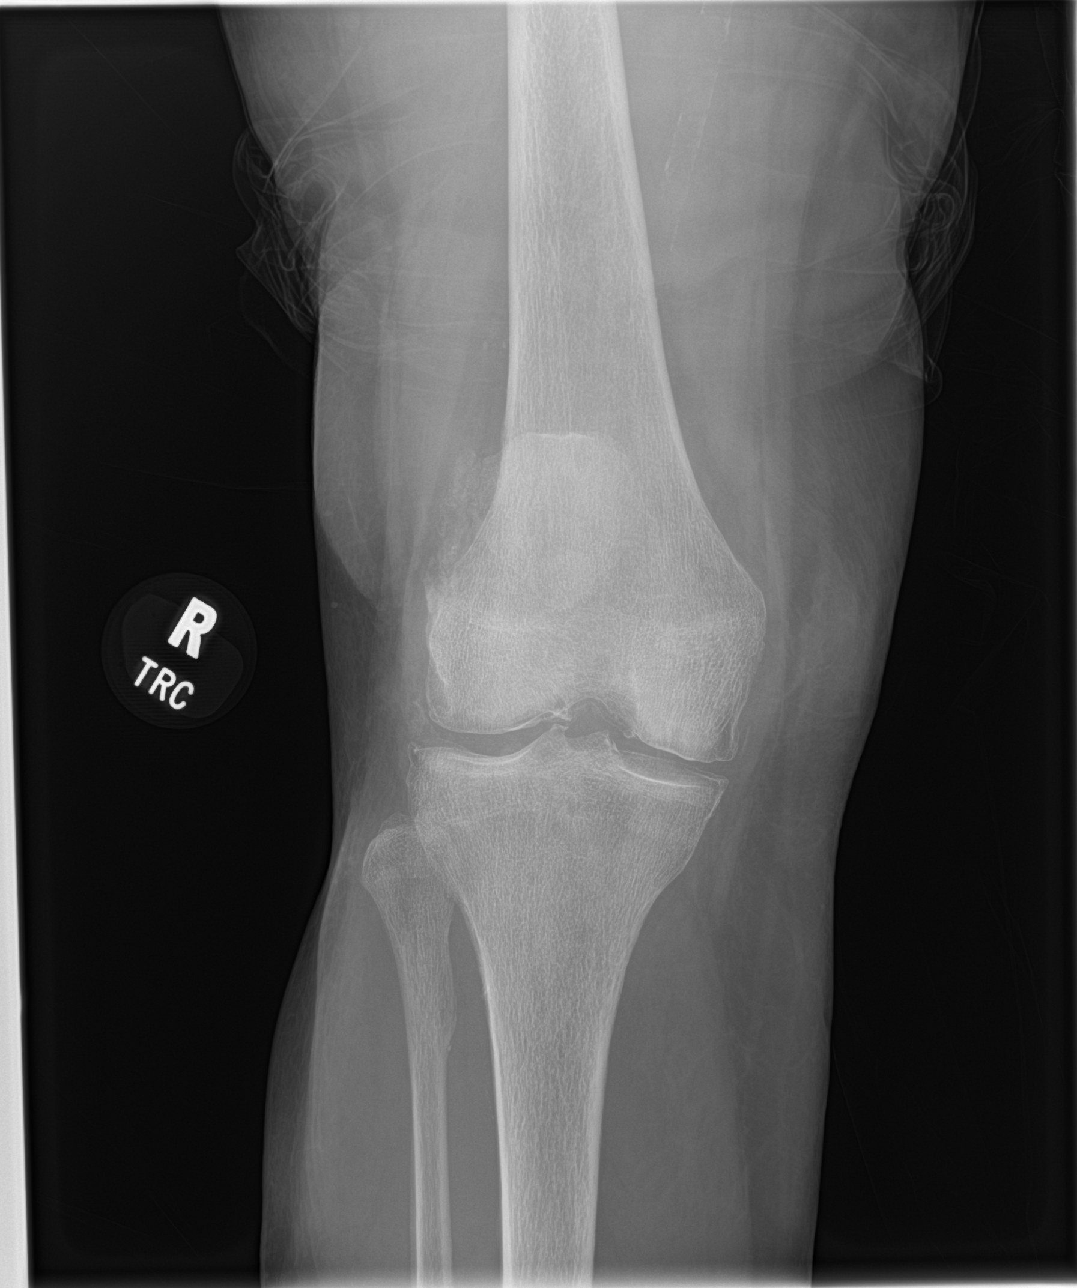

[knee obl (1 of 2)]
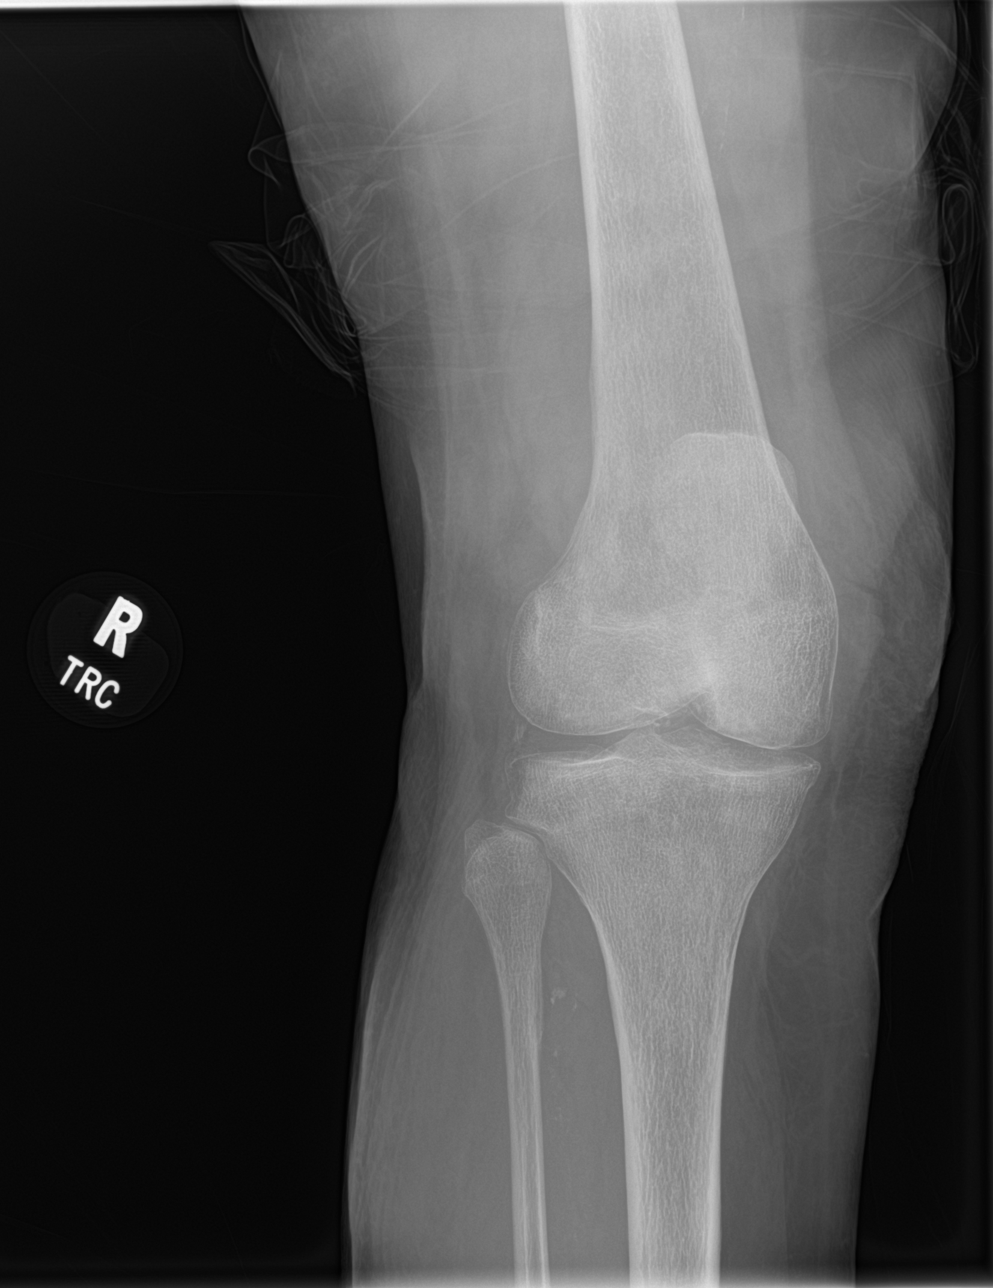

[knee obl (2 of 2)]
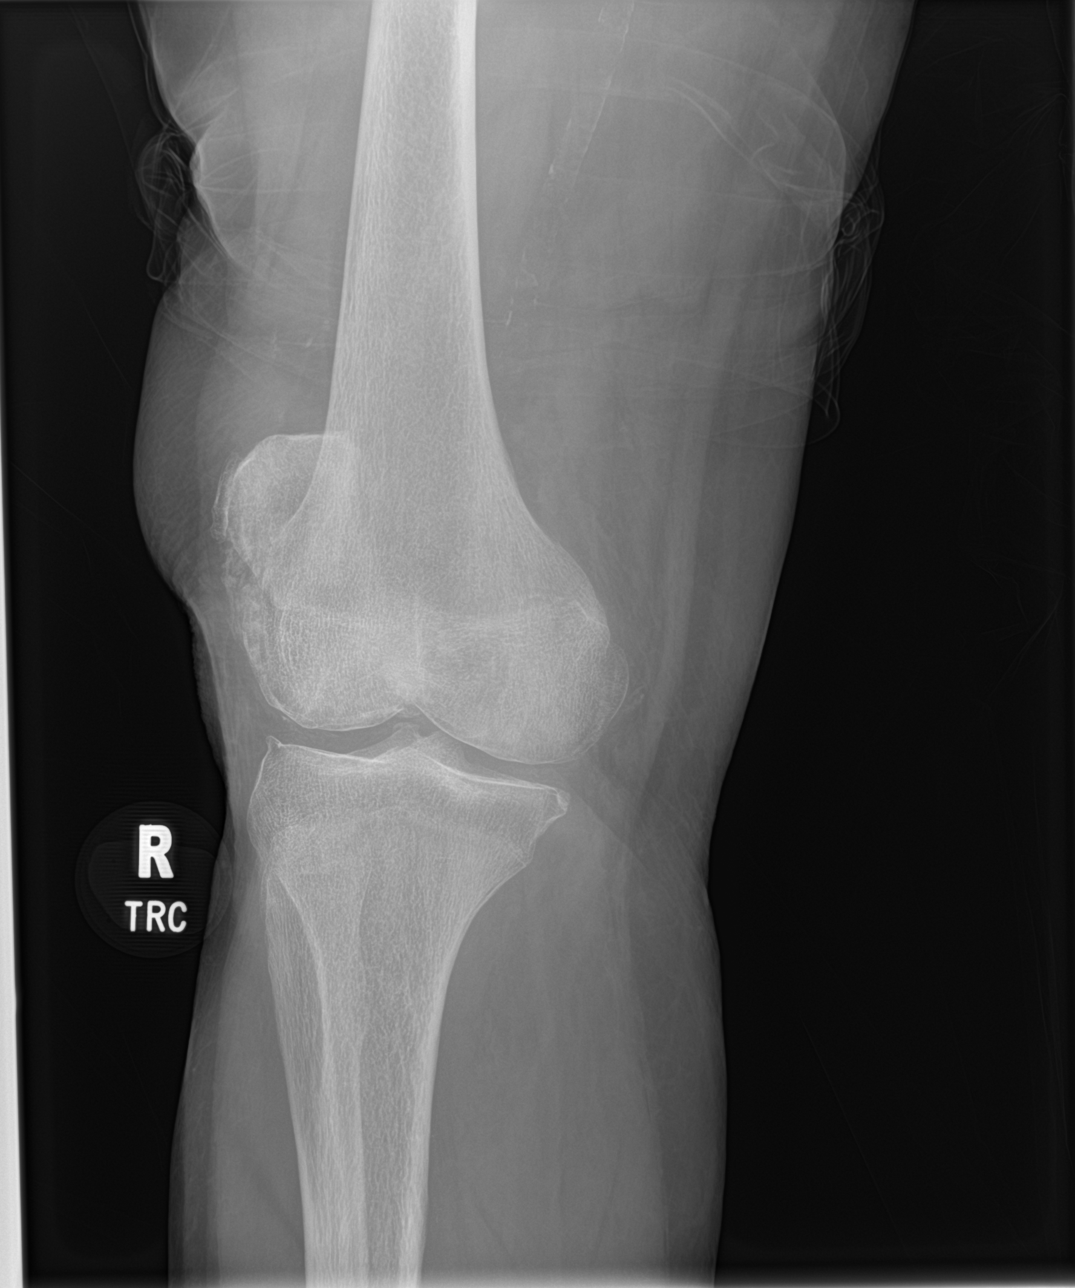

[knee lat]
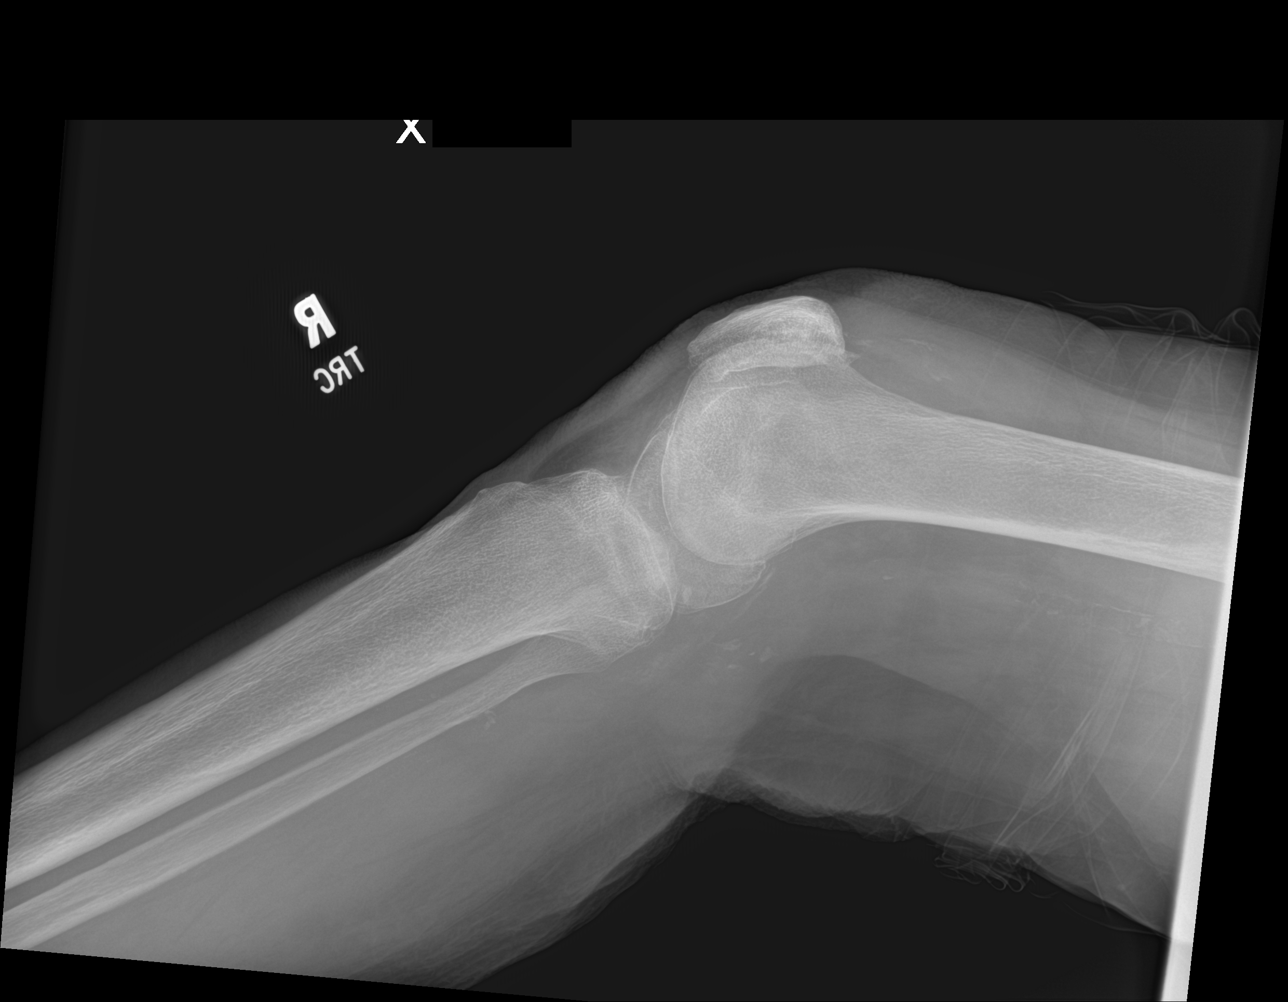

[4 of 4 positions shown; findings below may reference images not displayed]

FINDINGS: No evidence of fracture, dislocation, or joint effusion. There is
mild medial and lateral tibiofemoral compartment space narrowing.
Marked severity patellofemoral narrowing is noted. Soft tissues are
unremarkable.
IMPRESSION: 1. No acute osseous abnormality.
2. Tricompartmental degenerative changes, most severe in the
patellofemoral compartment.

## 2021-09-04 IMAGING — DX DG CHEST 1V PORT
1 series · 1 of 1 positions shown · non-contrast
Comparison: None.

CLINICAL DATA: Syncope, falls

EXAM:
PORTABLE CHEST 1 VIEW

[chest ap]
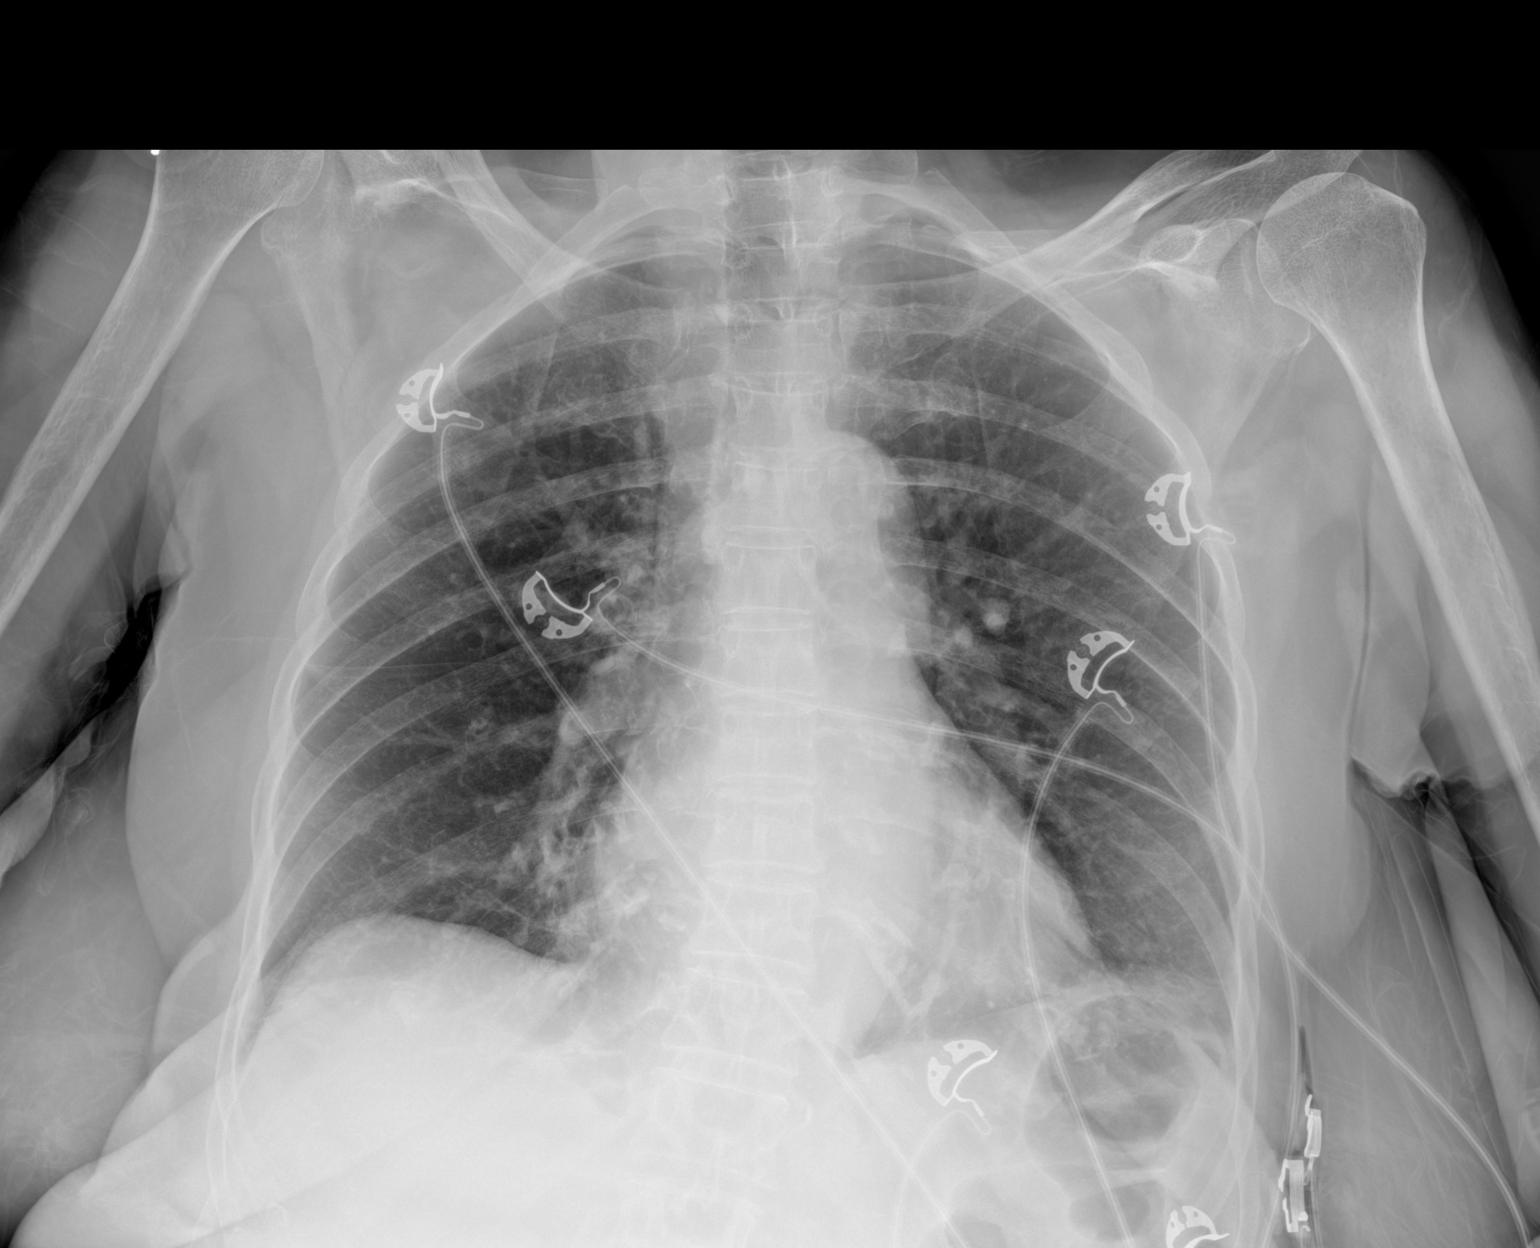

[1 of 1 positions shown; findings below may reference images not displayed]

FINDINGS: Streaky opacities in the left lung base, possibly reflecting
atelectasis or early airspace disease. Lungs are otherwise clear. No
pneumothorax or visible effusion. The aorta is calcified. The
remaining cardiomediastinal contours are unremarkable. No acute
osseous or soft tissue abnormality. Degenerative changes are present
in the imaged spine and shoulders. Telemetry leads overlie the
chest.
IMPRESSION: Streaky opacities in the left lung base, favor atelectasis though
early airspace disease could present similarly in the appropriate
clinical context.

## 2022-01-24 DIAGNOSIS — D696 Thrombocytopenia, unspecified: Secondary | ICD-10-CM | POA: Insufficient documentation

## 2022-02-22 ENCOUNTER — Other Ambulatory Visit: Payer: Self-pay | Admitting: Nephrology

## 2022-02-22 DIAGNOSIS — N1832 Chronic kidney disease, stage 3b: Secondary | ICD-10-CM

## 2022-03-26 ENCOUNTER — Ambulatory Visit
Admission: RE | Admit: 2022-03-26 | Discharge: 2022-03-26 | Disposition: A | Discharge status: Home / Self Care | Payer: Medicare PPO | Source: Ambulatory Visit | Attending: Nephrology | Admitting: Nephrology

## 2022-03-26 DIAGNOSIS — N1832 Chronic kidney disease, stage 3b: Secondary | ICD-10-CM | POA: Diagnosis present

## 2022-04-24 DIAGNOSIS — Z862 Personal history of diseases of the blood and blood-forming organs and certain disorders involving the immune mechanism: Secondary | ICD-10-CM | POA: Insufficient documentation

## 2022-09-02 ENCOUNTER — Encounter: Payer: Self-pay | Admitting: Podiatry

## 2022-09-02 ENCOUNTER — Ambulatory Visit: Payer: Medicare PPO | Admitting: Podiatry

## 2022-09-02 DIAGNOSIS — B351 Tinea unguium: Secondary | ICD-10-CM | POA: Diagnosis not present

## 2022-09-02 DIAGNOSIS — M79675 Pain in left toe(s): Secondary | ICD-10-CM | POA: Diagnosis not present

## 2022-09-02 DIAGNOSIS — L84 Corns and callosities: Secondary | ICD-10-CM | POA: Diagnosis not present

## 2022-09-02 DIAGNOSIS — M2041 Other hammer toe(s) (acquired), right foot: Secondary | ICD-10-CM | POA: Diagnosis not present

## 2022-09-02 DIAGNOSIS — E782 Mixed hyperlipidemia: Secondary | ICD-10-CM | POA: Insufficient documentation

## 2022-09-02 DIAGNOSIS — M8580 Other specified disorders of bone density and structure, unspecified site: Secondary | ICD-10-CM | POA: Insufficient documentation

## 2022-09-02 DIAGNOSIS — I10 Essential (primary) hypertension: Secondary | ICD-10-CM | POA: Insufficient documentation

## 2022-09-02 DIAGNOSIS — M21611 Bunion of right foot: Secondary | ICD-10-CM

## 2022-09-02 DIAGNOSIS — M2011 Hallux valgus (acquired), right foot: Secondary | ICD-10-CM | POA: Diagnosis not present

## 2022-09-02 DIAGNOSIS — M79674 Pain in right toe(s): Secondary | ICD-10-CM | POA: Diagnosis not present

## 2022-09-02 NOTE — Patient Instructions (Signed)
More silicone pads can be purchased from:  [email or web address]   Engineer, maintenance (IT) on [employer] in [place] is a good resource for shoes

## 2022-09-02 NOTE — Progress Notes (Signed)
  Subjective:  Patient ID: Christine Johns, female    DOB: November 24, 1940,  MRN: [id number]  Chief Complaint  Patient presents with   Christine Larsson    NP- L foot Hammertoe    82 y.o. female presents with the above complaint. History confirmed with patient. She is here with her daughter, notes a painful callus on the R 2nd toe which sticks up, nails thickened, elognated, causing pain as well  Objective:  Physical Exam: warm, good capillary refill, no trophic changes or ulcerative lesions, normal DP and PT pulses, and normal sensory exam. Left Foot: dystrophic yellowed discolored nail plates with subungual debris Right Foot: dystrophic yellowed discolored nail plates with subungual debris and severe hallux valgus and rigid hammertoe 2nd with painful callus PIPJ dorsal, near complete dislocation   Assessment:   1. Hammertoe of right foot   2. Hallux valgus with bunions, right   3. Callus of foot   4. Pain due to onychomycosis of toenails of both feet      Plan:  Patient was evaluated and treated and all questions answered.  Discussed the etiology and treatment options for the condition in detail with the patient. Educated patient on the topical and oral treatment options for mycotic nails. Recommended debridement of the nails today. Sharp and mechanical debridement performed of all painful and mycotic nails today. Nails debrided in length and thickness using a nail nipper to level of comfort. Discussed treatment options including appropriate shoe gear. Follow up as needed for painful nails.   All symptomatic hyperkeratoses were safely debrided with a sterile #15 blade to patient's level of comfort without incident. We discussed preventative and palliative care of these lesions including supportive and accommodative shoegear, padding, prefabricated and custom molded accommodative orthoses, use of a pumice stone and lotions/creams daily.  We discussed how the callus forms from the significant  hammertoe deformity, discussed that reconstruction of the deformity likely not feasible at this point considering her age and overall health, discussed if continually painful, ulcerating or signs of infection develop an amputation would be the only reasonable surgical treatment.  For now continue nonop treatment with silicone padding which was dispensed  Return if symptoms worsen or fail to improve.

## 2022-10-16 ENCOUNTER — Ambulatory Visit: Payer: Medicare PPO | Admitting: Podiatry

## 2023-05-01 ENCOUNTER — Encounter (HOSPITAL_COMMUNITY): Payer: Self-pay

## 2023-05-01 ENCOUNTER — Observation Stay (HOSPITAL_COMMUNITY)
Admission: EM | Admit: 2023-05-01 | Discharge: 2023-05-02 | Disposition: A | Discharge status: Home / Self Care | Payer: Medicare PPO | Attending: Internal Medicine | Admitting: Internal Medicine

## 2023-05-01 ENCOUNTER — Other Ambulatory Visit: Payer: Self-pay

## 2023-05-01 DIAGNOSIS — D649 Anemia, unspecified: Secondary | ICD-10-CM | POA: Diagnosis not present

## 2023-05-01 DIAGNOSIS — I129 Hypertensive chronic kidney disease with stage 1 through stage 4 chronic kidney disease, or unspecified chronic kidney disease: Secondary | ICD-10-CM | POA: Diagnosis not present

## 2023-05-01 DIAGNOSIS — M6281 Muscle weakness (generalized): Secondary | ICD-10-CM | POA: Diagnosis not present

## 2023-05-01 DIAGNOSIS — N184 Chronic kidney disease, stage 4 (severe): Secondary | ICD-10-CM | POA: Diagnosis not present

## 2023-05-01 DIAGNOSIS — I4891 Unspecified atrial fibrillation: Secondary | ICD-10-CM

## 2023-05-01 DIAGNOSIS — F0390 Unspecified dementia without behavioral disturbance: Secondary | ICD-10-CM | POA: Insufficient documentation

## 2023-05-01 DIAGNOSIS — Z79899 Other long term (current) drug therapy: Secondary | ICD-10-CM | POA: Diagnosis not present

## 2023-05-01 DIAGNOSIS — R2681 Unsteadiness on feet: Secondary | ICD-10-CM | POA: Insufficient documentation

## 2023-05-01 DIAGNOSIS — R55 Syncope and collapse: Principal | ICD-10-CM | POA: Insufficient documentation

## 2023-05-01 DIAGNOSIS — I480 Paroxysmal atrial fibrillation: Secondary | ICD-10-CM | POA: Diagnosis not present

## 2023-05-01 DIAGNOSIS — N179 Acute kidney failure, unspecified: Secondary | ICD-10-CM | POA: Diagnosis not present

## 2023-05-01 HISTORY — DX: Unspecified dementia, unspecified severity, without behavioral disturbance, psychotic disturbance, mood disturbance, and anxiety: F03.90

## 2023-05-01 LAB — URINALYSIS, W/ REFLEX TO CULTURE (INFECTION SUSPECTED)
Bilirubin Urine: NEGATIVE
Glucose, UA: NEGATIVE mg/dL
Hgb urine dipstick: NEGATIVE
Ketones, ur: 5 mg/dL — AB
Nitrite: NEGATIVE
Protein, ur: NEGATIVE mg/dL
Specific Gravity, Urine: 1.008 (ref 1.005–1.030)
pH: 5 (ref 5.0–8.0)

## 2023-05-01 LAB — COMPREHENSIVE METABOLIC PANEL
ALT: 13 U/L (ref 0–44)
AST: 28 U/L (ref 15–41)
Albumin: 3.5 g/dL (ref 3.5–5.0)
Alkaline Phosphatase: 45 U/L (ref 38–126)
Anion gap: 9 (ref 5–15)
BUN: 23 mg/dL (ref 8–23)
CO2: 21 mmol/L — ABNORMAL LOW (ref 22–32)
Calcium: 9.3 mg/dL (ref 8.9–10.3)
Chloride: 104 mmol/L (ref 98–111)
Creatinine, Ser: 1.8 mg/dL — ABNORMAL HIGH (ref 0.44–1.00)
GFR, Estimated: 27 mL/min — ABNORMAL LOW (ref >60)
Glucose, Bld: 95 mg/dL (ref 70–99)
Potassium: 4.2 mmol/L (ref 3.5–5.1)
Sodium: 134 mmol/L — ABNORMAL LOW (ref 135–145)
Total Bilirubin: 1 mg/dL (ref <1.2)
Total Protein: 6.5 g/dL (ref 6.5–8.1)

## 2023-05-01 LAB — CBC
HCT: 32.5 % — ABNORMAL LOW (ref 36.0–46.0)
Hemoglobin: 11.1 g/dL — ABNORMAL LOW (ref 12.0–15.0)
MCH: 30.9 pg (ref 26.0–34.0)
MCHC: 34.2 g/dL (ref 30.0–36.0)
MCV: 90.5 fL (ref 80.0–100.0)
Platelets: 148 10*3/uL — ABNORMAL LOW (ref 150–400)
RBC: 3.59 MIL/uL — ABNORMAL LOW (ref 3.87–5.11)
RDW: 12.4 % (ref 11.5–15.5)
WBC: 7.8 10*3/uL (ref 4.0–10.5)
nRBC: 0 % (ref 0.0–0.2)

## 2023-05-01 LAB — TROPONIN I (HIGH SENSITIVITY)
Troponin I (High Sensitivity): 11 ng/L (ref <18)
Troponin I (High Sensitivity): 9 ng/L (ref <18)

## 2023-05-01 LAB — MAGNESIUM: Magnesium: 1.2 mg/dL — ABNORMAL LOW (ref 1.7–2.4)

## 2023-05-01 LAB — CK: Total CK: 90 U/L (ref 38–234)

## 2023-05-01 MED ORDER — ENOXAPARIN SODIUM 30 MG/0.3ML IJ SOSY
30.0000 mg | PREFILLED_SYRINGE | INTRAMUSCULAR | Status: DC
  Administered 2023-05-01: 30 mg via SUBCUTANEOUS
  Filled 2023-05-01 (×2): qty 0.3

## 2023-05-01 MED ORDER — MAGNESIUM SULFATE 4 GM/100ML IV SOLN
4.0000 g | Freq: Once | INTRAVENOUS | Status: AC
  Administered 2023-05-01: 4 g via INTRAVENOUS
  Filled 2023-05-01: qty 100

## 2023-05-01 MED ORDER — TRAZODONE HCL 50 MG PO TABS
50.0000 mg | ORAL_TABLET | Freq: Every day | ORAL | Status: DC
  Administered 2023-05-01: 50 mg via ORAL
  Filled 2023-05-01: qty 1

## 2023-05-01 NOTE — H&P (Addendum)
Date: 05/01/2023               Patient Name:  Christine Johns MRN: [id number]  DOB: 1940/08/07 Age / Sex: 82 y.o., female   PCP: Marisue Ivan, MD         Medical Service: Internal Medicine Teaching Service         Attending Physician: Dr. Debe Coder, MD    First Contact: Dr. Philomena Doheny, MD Pager: [phone]  Second Contact: Dr. Rocky Morel, DO Pager: [phone]       After Hours (After 5p/  First Contact Pager: [phone]  weekends / holidays): Second Contact Pager: [phone]   Chief Complaint: syncope  History of Present Illness:   Christine Johns is a 82 yo with a medical history of HTN, HLD, CKD IV, dementia, and remote history of hospitalization for Afib with RVR in 2021 not on anticoagulation who presents to the ED for syncope. History obtained mostly from patient's daughter, Efrain Glatfelter, who is at bedside.   Per patient's daughter, patient was in the car with patient's husband, patient's daughter, and son in law on their way to a Thanksgiving brunch today. When they arrived about 30 minutes later, patient did not attempt to get out of the car. Patient's daughter attempted to rouse her for several minutes, meanwhile staff at their destination called EMS. After minutes of repetitive tactile and verbal prompting, patient came to and was able to recognize her son in law, but remained in a state of confusion for about 20-25 minutes until they arrived to the hospital and she was able to recognize and call out to her daughter. Patient's daughter states she has been at her baseline since arrival.   Patient experienced incontinence during the episode, which is unusual for her. No one in the car observed any shaking or trembling prior or during the syncopal event, and patient did not share any complaints of pain or discomfort before or after the event. Patient denied feeling poorly today, although her daughter doesn't think she would complain even if she was feeling  different. Patient had breakfast and has been hydrating well. No other episodes of syncope, fevers, chills, recent illnesses, or sick contacts. Daughter lives in [place] and visits both of her parents once every 3-4 weeks to help manage bills/medications. Patient has not had other cardiac related problems since 2021. At that time, patient was given a course of cardizem and Eliquis following hospital discharge. Both medications were discontinued years ago by her PCP, patient has never been followed by a cardiologist.   Patient follows closely with her PCP, Dr. Burnadette Pop, who she saw on 11/26 for worsening chronic kidney disease which progressed from CKD 3 to CKD 4. At the time, patient admitted to not drinking enough water, was asked to drink 64 oz daily. She denied any fever, chills, respiratory issues, CP or syncopal episodes, urinary issues, vaginal issues, or overt bleeding. PCP discontinued her hydrochlorothiazide 12.5 mg daily and recommended follow up with nephrology. Patient's husband gives her home medications (Vitamin D, lisinopril, MVA, lovastatin, potassium tablet) every evening. Patient's last dose of all medications yesterday evening.  Code status: Confirmed Full Code status with patient and patient's daughter. Patient seemed to have limited insight to difference between code status options, deferred to daughter for help deciding. Per patient's daughter there are advanced directions and she is the patient's POA.   ED course: Patient arrived by EMS, afebrile and HDS. EKG notable for sinus rhythm,  HR 53 BMP, atrial premature complexes, and incomplete RBBB. QTc 405 ms WNL. Other available EKG for comparison from 2009. Mg 1.2 low. WBC 7.8 WNL, Hgb 11.1 low. CBG 95. No interventions given in ED.   Meds:  Confirmed home medications with patient's daughter. Current Meds  Medication Sig   Cholecalciferol (VITAMIN D) 125 MCG (5000 UT) CAPS Take 1 capsule by mouth daily.   lisinopril (ZESTRIL) 40  MG tablet Take 40 mg by mouth daily.   lovastatin (MEVACOR) 20 MG tablet Take 30 mg by mouth at bedtime.   Multiple Vitamin (MULTI-VITAMIN) tablet Take 1 tablet by mouth daily.   Potassium 99 MG TABS Take 1 tablet by mouth daily.    Allergies: Allergies as of 05/01/2023 - Review Complete 05/01/2023  Allergen Reaction Noted   Atorvastatin Other (See Comments) 12/24/2013   Penicillins Itching and Swelling 12/24/2013   Sulfa antibiotics Itching and Swelling 12/24/2013   Past Medical History:  Diagnosis Date   Dementia (HCC)    Hypertension    Family History:  Patient's mother with history of "blacking out"  Social History:  - Lives with husband, family support nearby, daughter comes to visit every 3-4 weeks - Dependent on husband and other family members for ADLs and IADLs - PCP Dr. Burnadette Pop - Never tobacco user - Denies any alcohol use or drug use  Review of Systems: A complete ROS was negative except as per HPI.      Latest Ref Rng & Units 05/01/2023    2:36 PM 03/28/2020    5:14 AM 03/27/2020    9:35 PM  CBC  WBC 4.0 - 10.5 K/uL 7.8  9.4  9.5   Hemoglobin 12.0 - 15.0 g/dL 40.9  81.1  91.4   Hematocrit 36.0 - 46.0 % 32.5  29.9  34.7   Platelets 150 - 400 K/uL 148  192  187        Latest Ref Rng & Units 05/01/2023    2:36 PM 03/29/2020    6:22 AM 03/28/2020    2:34 PM  CMP  Glucose 70 - 99 mg/dL 95  89  782   BUN 8 - 23 mg/dL 23  16  24    Creatinine 0.44 - 1.00 mg/dL 9.56  2.13  0.86   Sodium 135 - 145 mmol/L 134  141  138   Potassium 3.5 - 5.1 mmol/L 4.2  4.2  3.6   Chloride 98 - 111 mmol/L 104  114  108   CO2 22 - 32 mmol/L 21  21  20    Calcium 8.9 - 10.3 mg/dL 9.3  8.1  8.0   Total Protein 6.5 - 8.1 g/dL 6.5     Total Bilirubin <1.2 mg/dL 1.0     Alkaline Phos 38 - 126 U/L 45     AST 15 - 41 U/L 28     ALT 0 - 44 U/L 13       Magnesium 1.2 UA needs to be collected Troponin I needs to be collected CK needs to be collected  Physical Exam: Blood  pressure 136/87, pulse 68, temperature 97.6 F (36.4 C), temperature source Oral, resp. rate 19, height 5\' 3"  (1.6 m), weight 59 kg, SpO2 100%. General: Patient resting comfortably in bed in no acute distress. Did attempt to remove leads when daughter discussed going home for an hour to grab a change of clothes, required verbal prompting to keep leads on and stay in her bed.  CV: Irregular rhythm, difficult to discern  murmur, no rubs or gallops. No LE edema.  Abdominal: Soft, non-tender, non-distended.  GU: No suprapubic pain with palpation, no R or L flank pain.  Neuro: Oriented to self, "hospital", and year 2024. [facility]. Follows simple verbal prompts. Answers questions appropriately. Seems to be at baseline, no focal deficits noted at this time.  MSK: Moves all limbs spontaneously. Did not assess gait given connection to leads and BP cuff.   EKG: personally reviewed my interpretation is sinus rhythm, HR 53 BMP, atrial premature complexes, and incomplete RBBB. QTc 405 ms WNL.  Assessment & Plan by Problem: Principal Problem:   Syncope  Marien Magner. Cera is a 82 yo with a medical history of HTN, HLD, CKD IV, dementia, and remote history of hospitalization for Afib with RVR in 2021 not on anticoagulation who presents to the ED for syncope. History obtained mostly from patient's daughter, Carleena Neupert, who is at bedside.   Syncope Patient presenting with single episode of syncope with some post-syncopal confusion with a return to baseline around 20-25 minutes after the event. Pertinent negatives include lack of shaking/trembling, presyncopal symptoms, focal deficits, fever, chills, recent illness, or dysuria. Pertinent positives include incontinence, prolonged confusion after the event, irregular heart rhythm on physical exam and monitor, age, and history of Afib. No history of AS, coagulopathies, seizure, or stroke.    Etiology of syncopal event unclear at this time.  Differential includes seizure, orthostatic hypotension, valvular disease, dehydration, metabolic derangement, infection, and stroke. PE unlikely given Well's score of 0, no clinical signs or symptoms of PE or DVT, no tachycardia, and patient satting well on room air. CMP mostly unremarkable, making metabolic derangement less likely. No acute deficits, with patient's family endorsing patient is at her baseline, making stroke less likely. Will keep patient on telemetry given history of Afib and irregular rhythm on exam, will also get echocardiogram to help determine if there is valvular disease (no obvious murmur concerning for AS on exam). Patient had breakfast and has been getting good hydration, did not appear volume down on exam making dehydration less likely. Although patient afebrile, no suprapubic or flank pain, no dysuria, without sick contacts or recent illness, and WBC 7.8 WNL, will get UA and bladder scan to help rule out UTI. Will hold home medications for now until completion of labs and further workup. Depending on workup results, will consider referrals to neurology and cardiology as needed.  Plan:  - Telemetry  - Repeat EKG  - Echocardiogram  - EEG  - CK  - Orthostatic vital signs - UA, bladder scan  - RFP, CBC, B12, folate, iron panel tomorrow AM   Hypomagnesemia Magnesium 1.2 on admission, only comparison 2.5 from 3 years ago. Repleted with IV magnesium 4 g for 1x dose. - Follow up Mg tomorrow  Normocytic anemia Hgb on admission 11.1, MCV 90.5, platelets 148. Hgb from 02/22/22 12.8, MCV 95.9, platelets 183. May be related to CKD or borderline vitamin deficiencies. No overt bleeding on exam. Will check iron panel, B12, folate, and monitor.   HTN Chronic. BP here 120s/80s, will hold home lisinopril 40 mg daily for now and resume as needed.  HLD Chronic. Lipid profile from 04/28/23 with LDL 104. Holding home lovastatin 30 mg daily at bedtime until CK comes back.   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  Serum Cr in May 2024 1.3. Today serum Cr 1.8. Patient previously seen by nephrologist at [facility] nephrology in 2023, PCP recommending follow up with them. US renal from 2023 showing normal bladder, right kidney cyst with a single septation and left kidney with multiple simple cysts, no follow-up imaging recommended.   Patient did not appear dehydrated on exam, will defer IV fluids for now. Denied dysuria, no suprapubic pain on exam. Will follow up on UA and bladder scan. Recommend good oral hydration, which patient's daughter will continue to encourage, and  avoidance of nephrotoxic agents.  - Trend Cr  - Follow up UA - Follow up post void bladder scan   Dementia Chronic. Not on any home medications. No feeding or swallowing difficulties reported. Per patient's daughter, patient is able to answer questions but her answers may not be reliable. Discussed increased risk of delirium in the hospital, discussed reorientation strategies. Patient's daughter will remain with the patient overnight.   Code Status: Full Code VTE prophylaxis: sq Enoxaparin 30 mg  Diet: Regular  Dispo: Admit patient to Observation with expected length of stay less than 2 midnights.  Signed: Philomena Doheny, MD 05/01/2023, 6:36 PM  After 5pm on weekdays and 1pm on weekends: On Call pager: [phone]

## 2023-05-01 NOTE — Hospital Course (Addendum)
Christine Johns. Christine Johns is a 82 yo with a medical history of HTN, HLD, CKD IV, dementia, and remote history of hospitalization for Afib with RVR in 2021 not on anticoagulation who presented to the ED for syncope and admitted to the Internal Medicine Teaching Service on 11/28 for further work-up.   Syncope Paroxysmal A fib Patient admitted after single episode of syncope with some post-syncopal confusion with a return to baseline around 20-25 minutes after the event.  Workup here with low concerns for ACS, seizure, PE, stroke, sepsis.  EKG without ischemia, troponins low, EEG negative, low Wells score, no changes on head CT, no leukocytosis, no fever, no UTI symptoms.  She has a history of A-fib with RVR back in 2021 and was put on anticoagulation but taken off for an unknown reason.  Cardiac telemetry here showed predominantly sinus rhythm with frequent PACs and some PVCs but no definite prolonged atrial fibrillation.  However due to her high CHA2DS2-VASc score and relatively low bleeding risk we started her on Eliquis.  She was discharged with cardiology follow-up and plans to get a Zio patch for further monitoring.  Hypomagnesemia Magnesium 1.2 on admission, only comparison 2.5 from 3 years ago. Repleted with IV magnesium 4 g for 1x dose. Magnesium today 2.6. Monitored.   Normocytic anemia Hgb on admission 11.1, MCV 90.5, platelets 148. Hgb from 02/22/22 12.8, MCV 95.9, platelets 183. Hgb today 9.9, no overt bleeding on exam. B12, folate, and iron panels WNL. May be related to CKD.   HTN Chronic. BP here 120s/80s, held lisinopril 40 mg daily due to AKI.  Recommend repeating BMP on follow-up and restarting lisinopril if improved or trying a different class of antihypertensives.   HLD Chronic. Lipid profile from 04/28/23 with LDL 104. Held home lovastatin 30 mg daily at bedtime until CK came back, showing total CK 90 WNL.  Statin was resumed.   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 Serum Cr in May 2024 1.3.  Creatinine admission up to 1.8 and on the following day and 1.7.  Patient previously seen by nephrologist at [facility] nephrology in 2023, PCP recommending follow up with them. US renal from 2023 showing normal bladder, right kidney cyst with a single septation and left kidney with multiple simple cysts, no follow-up imaging recommended.  Her creatinine may be her new baseline with CKD 4, this will need to be followed with repeat BMP on follow-up.  Dementia Delirium/intermittent agitation  Chronic. Not on any home medications. No feeding or swallowing difficulties reported. Per patient's daughter, patient is able to answer questions but her answers may not be reliable. Discussed increased risk of delirium in the hospital, discussed reorientation strategies. Patient's daughter remained at bedside during admission. Patient redirectable with verbal cues and prompting, bed alarm, and Trazodone 50 mg daily at bedtime to help with agitation while in the hospital. Discontinued on discharge but pt did respond well to trazodone without significant daytime effects.

## 2023-05-01 NOTE — ED Provider Notes (Signed)
[place] EMERGENCY DEPARTMENT AT [facility] Provider Note   CSN: [id number] Arrival date & time: 05/01/23  1313     History  No chief complaint on file.   Christine Johns is a 82 y.o. female.  82 year old female with a past medical history of HTN, HLD, CKD, dementia, prior history of A-fib no longer on rate control or AC presents here for concerns of syncope.  Due to patient's cognitive impairment, her daughter provides the history.  The patient's daughter states that she was riding in the car.  When they went to help the patient out of the car, she was slumped over and not responsive.  The patient took several minutes to be aroused.  She then immediately woke up and recognized her son-in-law.  Patient's daughter states that they did not witness any seizure activity.  She does have a history of syncope in the past, back in 2021.  However, at that time patient was found to be in A-fib RVR.  She was hospitalized in October 2021, started on Cardizem and Eliquis.  Patient's daughter states that these medications have been discontinued several years ago.  The patient is currently only taking 1 antihypertensive and medication for her cholesterol.  The patient was in her normal state of health this morning.  She denies ever having any chest pain, shortness of breath, palpitations.  She denies pain on interview here.  Patient's daughter states that she has been eating and drinking normally for her.  No recent illnesses.  The history is provided by the patient, medical records and a caregiver. The history is limited by the condition of the patient.       Home Medications Prior to Admission medications   Medication Sig Start Date End Date Taking? Authorizing Provider  Cholecalciferol (VITAMIN D) 125 MCG (5000 UT) CAPS Take 1 capsule by mouth daily.   Yes [provider]  lisinopril (ZESTRIL) 40 MG tablet Take 40 mg by mouth daily. 03/16/20  Yes [provider]   lovastatin (MEVACOR) 20 MG tablet Take 30 mg by mouth at bedtime.   Yes [provider]  Multiple Vitamin (MULTI-VITAMIN) tablet Take 1 tablet by mouth daily.   Yes [provider]  Potassium 99 MG TABS Take 1 tablet by mouth daily.   Yes [provider]  hydrochlorothiazide (HYDRODIURIL) 12.5 MG tablet Take by mouth. Patient not taking: Reported on 05/01/2023 12/19/21 06/07/23  [provider]      Allergies    Atorvastatin, Penicillins, and Sulfa antibiotics    Review of Systems   As noted in HPI  Physical Exam Updated Vital Signs BP 125/81 (BP Location: Right Arm)   Pulse 71   Temp 97.8 F (36.6 C) (Oral)   Resp 19   Ht 5\' 3"  (1.6 m)   Wt 59 kg   SpO2 100%   BMI 23.04 kg/m  Physical Exam Vitals reviewed.  Constitutional:      General: She is not in acute distress.    Appearance: She is underweight. She is not ill-appearing, toxic-appearing or diaphoretic.  HENT:     Nose: Nose normal.     Mouth/Throat:     Mouth: Mucous membranes are moist.  Eyes:     General: No scleral icterus.    Extraocular Movements: Extraocular movements intact.     Conjunctiva/sclera: Conjunctivae normal.     Pupils: Pupils are equal, round, and reactive to light.  Cardiovascular:     Rate and Rhythm: Normal rate.  Rhythm irregular.     Pulses: Normal pulses.          Radial pulses are 2+ on the right side and 2+ on the left side.       Dorsalis pedis pulses are 2+ on the right side and 2+ on the left side.     Heart sounds: Normal heart sounds. No murmur heard.    No friction rub. No gallop.  Pulmonary:     Effort: Pulmonary effort is normal. No respiratory distress.     Breath sounds: Normal breath sounds. No wheezing, rhonchi or rales.  Abdominal:     General: There is no distension.     Palpations: Abdomen is soft.     Tenderness: There is no abdominal tenderness. There is no guarding or rebound.  Musculoskeletal:     Right lower leg: No edema.      Left lower leg: No edema.  Skin:    General: Skin is warm and dry.  Neurological:     General: No focal deficit present.     Mental Status: She is alert. Mental status is at baseline.     GCS: GCS eye subscore is 4. GCS verbal subscore is 5. GCS motor subscore is 6.     Cranial Nerves: Cranial nerves 2-12 are intact. No cranial nerve deficit, dysarthria or facial asymmetry.     Sensory: Sensation is intact. No sensory deficit.     Motor: Motor function is intact. No weakness.     ED Results / Procedures / Treatments   Labs (all labs ordered are listed, but only abnormal results are displayed) Labs Reviewed  COMPREHENSIVE METABOLIC PANEL - Abnormal; Notable for the following components:      Result Value   Sodium 134 (*)    CO2 21 (*)    Creatinine, Ser 1.80 (*)    GFR, Estimated 27 (*)    All other components within normal limits  CBC - Abnormal; Notable for the following components:   RBC 3.59 (*)    Hemoglobin 11.1 (*)    HCT 32.5 (*)    Platelets 148 (*)    All other components within normal limits  MAGNESIUM - Abnormal; Notable for the following components:   Magnesium 1.2 (*)    All other components within normal limits  URINALYSIS, W/ REFLEX TO CULTURE (INFECTION SUSPECTED)    EKG None  Radiology No results found.  Procedures Procedures    Medications Ordered in ED Medications  magnesium sulfate IVPB 4 g 100 mL (has no administration in time range)    ED Course/ Medical Decision Making/ A&P Clinical Course as of 05/01/23 1648  Thu May 01, 2023  1524 S- here for syncopal episode while riding in the car. In afib, hx of syncope with afib. Now asymptomatic. Restart anticoagulation -Fu labs, EKG [HJ]    Clinical Course User Index [HJ] Janyth Pupa, MD                                 Medical Decision Making Amount and/or Complexity of Data Reviewed Independent Historian: caregiver Labs: ordered. ECG/medicine tests: ordered and independent  interpretation performed.  Risk Prescription drug management. Decision regarding hospitalization.   82 year old female presents here following a syncopal episode.  Vitals reassuring.  On exam, patient is in no acute distress and is nontoxic-appearing.  Cardiac exam notable for irregularly irregular rhythm.  Patient noted to be in A-fib on  telemetry.  Auscultation sounds consistent with A-fib.  However, I did independently reviewed patient's ECG, which looks more like sinus rhythm with frequent PVCs.  No ischemic findings.  Given my exam, will repeat ECG.  Neurologic exam without deficits.  On chart review, patient does have a history of syncope.  This was in the setting of new onset A-fib with RVR in 2021.  She was started on Eliquis and Cardizem at that time.  However, is no longer on these medications as it seems she had an ablation and these medications were able to be stopped.  Differential diagnosis includes arrhythmia, electrolyte abnormality, UTI, anemia.  Will obtain basic labs, including magnesium.  UA, repeat ECG.  I independently reviewed the patient's labs, which are notable for magnesium of 1.2.  CBC has normocytic anemia that is at patient's baseline.  Creatinine is elevated at 1.8, which is her baseline based on review of outside labs.  UA pending.  Repeat ECG pending.  Will plan to give 4 g magnesium for supplementation.  Patient is felt to have high risk syncope given her new onset arrhythmia.  Feel that she would likely benefit from admission.  Will plan to admit to hospitalist service.  Patient's presentation is most consistent with acute presentation with potential threat to life or bodily function.         Final Clinical Impression(s) / ED Diagnoses Final diagnoses:  Syncope, unspecified syncope type  Atrial fibrillation, unspecified type [facility])    Rx / DC Orders ED Discharge Orders     None         Rolla Flatten, MD 05/01/23 1649    Benjiman Core,  MD 05/02/23 1436

## 2023-05-01 NOTE — ED Provider Notes (Signed)
  Physical Exam  BP 125/81 (BP Location: Right Arm)   Pulse 71   Temp 97.8 F (36.6 C) (Oral)   Resp 19   Ht 5\' 3"  (1.6 m)   Wt 59 kg   SpO2 100%   BMI 23.04 kg/m   Physical Exam  Procedures  Procedures  ED Course / MDM   Clinical Course as of 05/01/23 1629  Thu May 01, 2023  1524 S- here for syncopal episode while riding in the car. In afib, hx of syncope with afib. Now asymptomatic. Restart anticoagulation -Fu labs, EKG [HJ]    Clinical Course User Index [HJ] Janyth Pupa, MD   Medical Decision Making Amount and/or Complexity of Data Reviewed Labs: ordered.  Risk Prescription drug management. Decision regarding hospitalization.   CMP with elevated creatinine of 1.80, up from baseline of around 1.5-1.6.  CBC with anemia around the patient's baseline.  Magnesium 1.2.  This was repleted.  Patient was discussed with the hospitalist service, who agreed to admit the patient given high risk syncopal episode.  She was transported to the floor in stable condition.       Janyth Pupa, MD 05/01/23 [place]    Glyn Ade, MD 05/02/23 1505

## 2023-05-01 NOTE — ED Triage Notes (Signed)
Patient arrives by EMS for syncope.   Patient was in the back seat of daughters car going to go see family and was found passed out, unknown how long she was out for.   Patient's daughter reports she has done this in the past and was diagnosed with UTI.   Patient does have history of UTI.    Patient arrived incontinent of stool, patient cleaned up upon arrival.    EMS reports BG 140.

## 2023-05-01 NOTE — ED Notes (Signed)
ED TO INPATIENT HANDOFF REPORT  ED Nurse Name and Phone #: Marcelino Duster [phone]  S Name/Age/Gender Christine Johns 82 y.o. female Room/Bed: 020C/020C  Code Status   Code Status: Full Code  Home/SNF/Other Home Patient oriented to: self and place Is this baseline? Yes   Triage Complete: Triage complete  Chief Complaint Syncope [R55]  Triage Note Patient arrives by EMS for syncope.   Patient was in the back seat of daughters car going to go see family and was found passed out, unknown how long she was out for.   Patient's daughter reports she has done this in the past and was diagnosed with UTI.   Patient does have history of UTI.    Patient arrived incontinent of stool, patient cleaned up upon arrival.    EMS reports BG 140.    Allergies Allergies  Allergen Reactions   Atorvastatin Other (See Comments)   Penicillins Itching and Swelling   Sulfa Antibiotics Itching and Swelling    Level of Care/Admitting Diagnosis ED Disposition     ED Disposition  Admit   Condition  --   Comment  Hospital Area: [facility] [[id number]]  Level of Care: Telemetry Medical [104]  May place patient in observation at [facility] or [facility] if equivalent level of care is available:: Yes  Covid Evaluation: Asymptomatic - no recent exposure (last 10 days) testing not required  Diagnosis: Syncope [206001]  Admitting Physician: Inez Catalina [[id number]  Attending Physician: Nena Polio          B Medical/Surgery History Past Medical History:  Diagnosis Date   Dementia (HCC)    Hypertension    History reviewed. No pertinent surgical history.   A IV Location/Drains/Wounds Patient Lines/Drains/Airways Status     Active Line/Drains/Airways     Name Placement date Placement time Site Days   Peripheral IV 05/01/23 20 G Left Forearm 05/01/23  --  Forearm  less than 1            Intake/Output Last 24 hours No intake or output data in the 24 hours  ending 05/01/23 1713  Labs/Imaging Results for orders placed or performed during the hospital encounter of 05/01/23 (from the past 48 hour(s))  Comprehensive metabolic panel     Status: Abnormal   Collection Time: 05/01/23  2:36 PM  Result Value Ref Range   Sodium 134 (L) 135 - 145 mmol/L   Potassium 4.2 3.5 - 5.1 mmol/L    Comment: HEMOLYSIS AT THIS LEVEL MAY AFFECT RESULT   Chloride 104 98 - 111 mmol/L   CO2 21 (L) 22 - 32 mmol/L   Glucose, Bld 95 70 - 99 mg/dL    Comment: Glucose reference range applies only to samples taken after fasting for at least 8 hours.   BUN 23 8 - 23 mg/dL   Creatinine, Ser 6.57 (H) 0.44 - 1.00 mg/dL   Calcium 9.3 8.9 - 84.6 mg/dL   Total Protein 6.5 6.5 - 8.1 g/dL   Albumin 3.5 3.5 - 5.0 g/dL   AST 28 15 - 41 U/L    Comment: HEMOLYSIS AT THIS LEVEL MAY AFFECT RESULT   ALT 13 0 - 44 U/L    Comment: HEMOLYSIS AT THIS LEVEL MAY AFFECT RESULT   Alkaline Phosphatase 45 38 - 126 U/L   Total Bilirubin 1.0 <1.2 mg/dL    Comment: HEMOLYSIS AT THIS LEVEL MAY AFFECT RESULT   GFR, Estimated 27 (L) >60 mL/min    Comment: (  NOTE) Calculated using the CKD-EPI Creatinine Equation (2021)    Anion gap 9 5 - 15    Comment: Performed at [facility], [address], [place], [place] [address]  CBC     Status: Abnormal   Collection Time: 05/01/23  2:36 PM  Result Value Ref Range   WBC 7.8 4.0 - 10.5 K/uL   RBC 3.59 (L) 3.87 - 5.11 MIL/uL   Hemoglobin 11.1 (L) 12.0 - 15.0 g/dL   HCT 60.4 (L) 54.0 - 98.1 %   MCV 90.5 80.0 - 100.0 fL   MCH 30.9 26.0 - 34.0 pg   MCHC 34.2 30.0 - 36.0 g/dL   RDW 19.1 47.8 - 29.5 %   Platelets 148 (L) 150 - 400 K/uL   nRBC 0.0 0.0 - 0.2 %    Comment: Performed at [facility], [address], [place], [place] [address]  Magnesium     Status: Abnormal   Collection Time: 05/01/23  2:36 PM  Result Value Ref Range   Magnesium 1.2 (L) 1.7 - 2.4 mg/dL    Comment: Performed at [facility], [address],  [place], [place] [address]   No results found.  Pending Labs Unresulted Labs (From admission, onward)     Start     Ordered   05/01/23 1704  CK  Add-on,   AD        05/01/23 1704   05/01/23 1542  Urinalysis, w/ Reflex to Culture (Infection Suspected) -Urine, Clean Catch  Once,   URGENT       Question:  Specimen Source  Answer:  Urine, Clean Catch   05/01/23 1541            Vitals/Pain Today's Vitals   05/01/23 1342  BP: 125/81  Pulse: 71  Resp: 19  Temp: 97.8 F (36.6 C)  TempSrc: Oral  SpO2: 100%  Weight: 59 kg  Height: 5\' 3"  (1.6 m)  PainSc: 0-No pain    Isolation Precautions No active isolations  Medications Medications  magnesium sulfate IVPB 4 g 100 mL (has no administration in time range)  enoxaparin (LOVENOX) injection 30 mg (has no administration in time range)    Mobility walks     Focused Assessments Neuro:  Alert to self, will follow commands, decreased concentration, impulsive.  Resp:  Breath sounds clear Cardiac:  Irregular   R Recommendations: See Admitting Provider Note  Report given to:   Additional Notes:

## 2023-05-02 ENCOUNTER — Observation Stay (HOSPITAL_COMMUNITY): Payer: Medicare PPO

## 2023-05-02 ENCOUNTER — Other Ambulatory Visit (HOSPITAL_COMMUNITY): Payer: Self-pay

## 2023-05-02 ENCOUNTER — Telehealth (HOSPITAL_COMMUNITY): Payer: Self-pay | Admitting: Pharmacy Technician

## 2023-05-02 ENCOUNTER — Telehealth: Payer: Self-pay | Admitting: Cardiology

## 2023-05-02 DIAGNOSIS — E785 Hyperlipidemia, unspecified: Secondary | ICD-10-CM

## 2023-05-02 DIAGNOSIS — R55 Syncope and collapse: Secondary | ICD-10-CM

## 2023-05-02 DIAGNOSIS — I129 Hypertensive chronic kidney disease with stage 1 through stage 4 chronic kidney disease, or unspecified chronic kidney disease: Secondary | ICD-10-CM

## 2023-05-02 DIAGNOSIS — N1832 Chronic kidney disease, stage 3b: Secondary | ICD-10-CM

## 2023-05-02 DIAGNOSIS — R569 Unspecified convulsions: Secondary | ICD-10-CM | POA: Diagnosis not present

## 2023-05-02 DIAGNOSIS — D649 Anemia, unspecified: Secondary | ICD-10-CM

## 2023-05-02 DIAGNOSIS — F0390 Unspecified dementia without behavioral disturbance: Secondary | ICD-10-CM | POA: Diagnosis not present

## 2023-05-02 DIAGNOSIS — I480 Paroxysmal atrial fibrillation: Secondary | ICD-10-CM | POA: Diagnosis not present

## 2023-05-02 DIAGNOSIS — N184 Chronic kidney disease, stage 4 (severe): Secondary | ICD-10-CM | POA: Diagnosis not present

## 2023-05-02 DIAGNOSIS — N179 Acute kidney failure, unspecified: Secondary | ICD-10-CM

## 2023-05-02 LAB — ECHOCARDIOGRAM COMPLETE
AR max vel: 1.64 cm2
AV Area VTI: 2.02 cm2
AV Area mean vel: 1.8 cm2
AV Mean grad: 1.7 mm[Hg]
AV Peak grad: 3.5 mm[Hg]
Ao pk vel: 0.94 m/s
Area-P 1/2: 2.72 cm2
Calc EF: 66.1 %
Height: 63 in
MV M vel: 4.39 m/s
MV Peak grad: 77.1 mm[Hg]
MV VTI: 1.67 cm2
S' Lateral: 3.1 cm
Single Plane A2C EF: 67.5 %
Single Plane A4C EF: 62.8 %
Weight: 1809.54 [oz_av]

## 2023-05-02 LAB — RENAL FUNCTION PANEL
Albumin: 3.2 g/dL — ABNORMAL LOW (ref 3.5–5.0)
Anion gap: 10 (ref 5–15)
BUN: 20 mg/dL (ref 8–23)
CO2: 18 mmol/L — ABNORMAL LOW (ref 22–32)
Calcium: 9.1 mg/dL (ref 8.9–10.3)
Chloride: 107 mmol/L (ref 98–111)
Creatinine, Ser: 1.7 mg/dL — ABNORMAL HIGH (ref 0.44–1.00)
GFR, Estimated: 29 mL/min — ABNORMAL LOW (ref >60)
Glucose, Bld: 123 mg/dL — ABNORMAL HIGH (ref 70–99)
Phosphorus: 2.9 mg/dL (ref 2.5–4.6)
Potassium: 3.1 mmol/L — ABNORMAL LOW (ref 3.5–5.1)
Sodium: 135 mmol/L (ref 135–145)

## 2023-05-02 LAB — GLUCOSE, CAPILLARY: Glucose-Capillary: 110 mg/dL — ABNORMAL HIGH (ref 70–99)

## 2023-05-02 LAB — CBC
HCT: 29 % — ABNORMAL LOW (ref 36.0–46.0)
Hemoglobin: 9.9 g/dL — ABNORMAL LOW (ref 12.0–15.0)
MCH: 30.6 pg (ref 26.0–34.0)
MCHC: 34.1 g/dL (ref 30.0–36.0)
MCV: 89.5 fL (ref 80.0–100.0)
Platelets: 139 10*3/uL — ABNORMAL LOW (ref 150–400)
RBC: 3.24 MIL/uL — ABNORMAL LOW (ref 3.87–5.11)
RDW: 12.2 % (ref 11.5–15.5)
WBC: 5.4 10*3/uL (ref 4.0–10.5)
nRBC: 0 % (ref 0.0–0.2)

## 2023-05-02 LAB — MAGNESIUM: Magnesium: 2.6 mg/dL — ABNORMAL HIGH (ref 1.7–2.4)

## 2023-05-02 LAB — VITAMIN B12: Vitamin B-12: 564 pg/mL (ref 180–914)

## 2023-05-02 LAB — IRON AND TIBC
Iron: 30 ug/dL (ref 28–170)
Saturation Ratios: 11 % (ref 10.4–31.8)
TIBC: 284 ug/dL (ref 250–450)
UIBC: 254 ug/dL

## 2023-05-02 LAB — FERRITIN: Ferritin: 118 ng/mL (ref 11–307)

## 2023-05-02 LAB — FOLATE: Folate: 34.5 ng/mL (ref >5.9)

## 2023-05-02 MED ORDER — APIXABAN 2.5 MG PO TABS
2.5000 mg | ORAL_TABLET | Freq: Two times a day (BID) | ORAL | 1 refills | Status: AC
  Filled 2023-05-02: qty 60, 30d supply, fill #0

## 2023-05-02 MED ORDER — APIXABAN 2.5 MG PO TABS
2.5000 mg | ORAL_TABLET | Freq: Two times a day (BID) | ORAL | Status: DC
  Administered 2023-05-02: 2.5 mg via ORAL
  Filled 2023-05-02: qty 1

## 2023-05-02 MED ORDER — POTASSIUM CHLORIDE CRYS ER 20 MEQ PO TBCR
40.0000 meq | EXTENDED_RELEASE_TABLET | Freq: Once | ORAL | Status: AC
  Administered 2023-05-02: 40 meq via ORAL
  Filled 2023-05-02: qty 2

## 2023-05-02 NOTE — Plan of Care (Signed)
  Problem: Education: Goal: Knowledge of General Education information will improve Description: Including pain rating scale, medication(s)/side effects and non-pharmacologic comfort measures 05/02/2023 1601 by Zane Herald, Doylene Canning, RN Outcome: Completed/Met 05/02/2023 1052 by Zane Herald, Doylene Canning, RN Outcome: Progressing   Problem: Health Behavior/Discharge Planning: Goal: Ability to manage health-related needs will improve 05/02/2023 1601 by Zane Herald, Doylene Canning, RN Outcome: Completed/Met 05/02/2023 1052 by Zane Herald, Doylene Canning, RN Outcome: Progressing   Problem: Clinical Measurements: Goal: Ability to maintain clinical measurements within normal limits will improve 05/02/2023 1601 by Zane Herald, Doylene Canning, RN Outcome: Completed/Met 05/02/2023 1052 by Zane Herald, Doylene Canning, RN Outcome: Progressing Goal: Will remain free from infection 05/02/2023 1601 by Zane Herald, Doylene Canning, RN Outcome: Completed/Met 05/02/2023 1052 by Zane Herald, Doylene Canning, RN Outcome: Progressing Goal: Diagnostic test results will improve 05/02/2023 1601 by Zane Herald, Doylene Canning, RN Outcome: Completed/Met 05/02/2023 1052 by Zane Herald, Doylene Canning, RN Outcome: Progressing Goal: Respiratory complications will improve 05/02/2023 1601 by Zane Herald, Doylene Canning, RN Outcome: Completed/Met 05/02/2023 1052 by Zane Herald, Doylene Canning, RN Outcome: Progressing Goal: Cardiovascular complication will be avoided 05/02/2023 1601 by Zane Herald, Doylene Canning, RN Outcome: Completed/Met 05/02/2023 1052 by Zane Herald, Doylene Canning, RN Outcome: Progressing   Problem: Activity: Goal: Risk for activity intolerance will decrease 05/02/2023 1601 by Zane Herald, Doylene Canning, RN Outcome: Completed/Met 05/02/2023 1052 by Zane Herald, Doylene Canning, RN Outcome: Progressing   Problem: Nutrition: Goal: Adequate  nutrition will be maintained 05/02/2023 1601 by Zane Herald, Doylene Canning, RN Outcome: Completed/Met 05/02/2023 1052 by Zane Herald, Doylene Canning, RN Outcome: Progressing   Problem: Coping: Goal: Level of anxiety will decrease 05/02/2023 1601 by Zane Herald, Doylene Canning, RN Outcome: Completed/Met 05/02/2023 1052 by Zane Herald, Doylene Canning, RN Outcome: Progressing   Problem: Elimination: Goal: Will not experience complications related to bowel motility 05/02/2023 1601 by Zane Herald, Doylene Canning, RN Outcome: Completed/Met 05/02/2023 1052 by Zane Herald, Doylene Canning, RN Outcome: Progressing Goal: Will not experience complications related to urinary retention 05/02/2023 1601 by Zane Herald, Doylene Canning, RN Outcome: Completed/Met 05/02/2023 1052 by Zane Herald, Doylene Canning, RN Outcome: Progressing   Problem: Pain Management: Goal: General experience of comfort will improve 05/02/2023 1601 by Zane Herald, Doylene Canning, RN Outcome: Completed/Met 05/02/2023 1052 by Zane Herald, Doylene Canning, RN Outcome: Progressing   Problem: Safety: Goal: Ability to remain free from injury will improve 05/02/2023 1601 by Zane Herald, Doylene Canning, RN Outcome: Completed/Met 05/02/2023 1052 by Zane Herald, Doylene Canning, RN Outcome: Progressing   Problem: Skin Integrity: Goal: Risk for impaired skin integrity will decrease 05/02/2023 1601 by Zane Herald, Doylene Canning, RN Outcome: Completed/Met 05/02/2023 1052 by Zane Herald, Doylene Canning, RN Outcome: Progressing

## 2023-05-02 NOTE — Discharge Instructions (Addendum)
Christine Johns,  You were recently admitted to [facility] for a syncopal episode. We checked your heart structure and rhythm but did not find any abnormalities that would definitely explain your episode while your were in the hospital. You have been in atrial fibrillation before and we have restarted your anticoagulation (Eliquis).  We also did not see any evidence that you had a seizure or anything on the CT scan of your head that explains your episode. We also do not think this was caused by a urinary tract infection.  We recommend you see your primary care provider in the next 1-2 weeks and we will work on getting you set up with a cardiac monitor and cardiology follow up in [place]. We recommend your primary care rechecks a urinalysis.  On the head CT we did see evidence of small strokes that have been there since her last scan in 2021. 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 As below we are holding her lisinopril due to her kidney function and since her blood pressure here has been normal. I recommend you talk to her primary care about when and how to restart this or if another blood pressure medication would be more appropriate.   Continue taking your home medications with the following changes  Start taking Eliquis (abixaban) 2.5 mg twice daily Stop taking Lisinopril until your primary care provider says to resume Continue taking your other medications as prescribed   You should seek further medical care if you have any return of your symptoms. If this occurs or you have concerns over the weekend please call [phone] which is the number to get in tough with our on call resident.   We are so glad that you are feeling better.  Sincerely, Rocky Morel, DO   Information on my medicine - ELIQUIS (apixaban)  Why was  Eliquis prescribed for you? Eliquis was prescribed for you to reduce the risk of a blood clot forming that can cause a stroke if you have a medical condition called atrial fibrillation (a type of irregular heartbeat).  What do You need to know about Eliquis ? Take your Eliquis TWICE DAILY - one tablet in the morning and one tablet in the evening with or without food. If you have difficulty swallowing the tablet whole please discuss with your pharmacist how to take the medication safely.  Take Eliquis exactly as prescribed by your doctor and DO NOT stop taking Eliquis without talking to the doctor who prescribed the medication.  Stopping may increase your risk of developing a stroke.  Refill your prescription before you run out.  After discharge, you should have regular check-up appointments with your healthcare provider that is prescribing your Eliquis.  In the future your dose may need to be changed if your kidney function or weight changes by a significant amount or as you get older.  What do you do if you miss a dose? If you miss a dose, take it as soon as you remember on the same day and resume taking twice daily.  Do not take more than one dose of ELIQUIS at the same time to make up a missed dose.  Important Safety Information A possible side effect of Eliquis is bleeding. You should call your healthcare provider right away if you experience any of the following: Bleeding from an  injury or your nose that does not stop. Unusual colored urine (red or dark brown) or unusual colored stools (red or black). Unusual bruising for unknown reasons. A serious fall or if you hit your head (even if there is no bleeding).  Some medicines may interact with Eliquis and might increase your risk of bleeding or clotting while on Eliquis. To help avoid this, consult your healthcare provider or pharmacist prior to using any new prescription or non-prescription medications, including herbals, vitamins,  non-steroidal anti-inflammatory drugs (NSAIDs) and supplements.  This website has more information on Eliquis (apixaban): http://www.eliquis.com/eliquis/home

## 2023-05-02 NOTE — Progress Notes (Signed)
Summary: Christine Johns is a 82 yo with a medical history of HTN, HLD, CKD IV, dementia, and remote history of hospitalization for Afib with RVR in 2021 not on anticoagulation who presented to the ED for syncope and admitted to the Internal Medicine Teaching Service on 11/28 for further work-up.   Subjective:  Day #1. Nursing paged overnight team for Afib with RVR w/ HR 143 at 04:21, patient appeared agitated and confused, calmed down with verbal prompts. Team gave patient dose of Trazodone 50 mg daily, nursing activated bed alarm, patient's daughter remained at bedside.   Patient is awake in bed with daughter at bedside. Patient denies any acute pain, including CP or dysuria, or concern. Daughter states patient was mostly awake all night but redirectable, states medication given during agitation event was helpful. Discussed lab and imaging results, the need to collect more information today, and cardiology consult. Patient and patient's daughter endorsed understanding.   Objective:  Vital signs in last 24 hours: Vitals:   05/02/23 0002 05/02/23 0422 05/02/23 0427 05/02/23 0755  BP: (!) 118/55 (!) 151/71  102/67  Pulse: 69 (!) 45  66  Resp: 16 18  16   Temp: 97.8 F (36.6 C) 98.2 F (36.8 C)  97.7 F (36.5 C)  TempSrc: Oral Oral    SpO2: 98% 99%  99%  Weight:   51.3 kg   Height:          Latest Ref Rng & Units 05/02/2023    5:44 AM 05/01/2023    2:36 PM 03/28/2020    5:14 AM  CBC  WBC 4.0 - 10.5 K/uL 5.4  7.8  9.4   Hemoglobin 12.0 - 15.0 g/dL 9.9  46.9  62.9   Hematocrit 36.0 - 46.0 % 29.0  32.5  29.9   Platelets 150 - 400 K/uL 139  148  192        Latest Ref Rng & Units 05/02/2023    5:44 AM 05/01/2023    2:36 PM 03/29/2020    6:22 AM  BMP  Glucose 70 - 99 mg/dL 528  95  89   BUN 8 - 23 mg/dL 20  23  16    Creatinine 0.44 - 1.00 mg/dL 4.13  2.44  0.10   Sodium 135 - 145 mmol/L 135  134  141   Potassium 3.5 - 5.1 mmol/L 3.1  4.2  4.2   Chloride 98 - 111 mmol/L 107   104  114   CO2 22 - 32 mmol/L 18  21  21    Calcium 8.9 - 10.3 mg/dL 9.1  9.3  8.1     UA 27/25 Hazy, 1.008 gravity, some ketones, leukocytes, and bacteria; negative for protein, blood, or nitrite.  Troponin I 11/28 9, 11  Total CK 90 WNL  Vitamin B12 564 WNL  Folate 34.5 WNL  Iron panel and ferritin WNL  Mg 2.6   Pending labs Urine culture  Physical Exam Constitutional: Patient is resting in bed in no acute distress, answering simple questions appropriately  CV: Irregular rate and rhythm without murmurs on auscultation. No LE edema.  Pulmonary/Respiratory: Normal respiratory effort on room air.  Abdominal: Soft, non-tender, non-distended, denied suprapubic pain.  Neuro: Appears at baseline.   Skin: Warm and dry. Psych: Normal mood and affect.   Assessment/Plan:  Principal Problem:   Syncope  Christine Johns. Christine Johns is a 82 yo with a medical history of HTN, HLD, CKD IV, dementia, and remote history of hospitalization for Afib with  RVR in 2021 not on anticoagulation who presents to the ED for syncope. History obtained mostly from patient's daughter, [name], who is at bedside.    Syncope Patient admitted after single episode of syncope with some post-syncopal confusion with a return to baseline around 20-25 minutes after the event. Telemetry showed Afib with RVR w/ HR 143 which came back down to 45 BMP shortly after without interventions. Other than agitation, patient appeared asymptomatic denying any CP. Agitation may have been due to event and/or delirium.   Telemetry since admission was reviewed showing multiform PVCs. At this time, with patient's history of Afib with RVR requiring anticoagulation treatment and current clinical presentation feel more strongly that syncopal event may be due to cardiac etiology such as sinus node dysfunction. Echo results pending. Patient's CHA2DS2-VASc score for atrial fibrillation stroke risk is 4 points with 4.8% risk of bleeding and 6.7% risk of  stroke. Concern for starting any anticoagulation at this time as patient's intermittent delirium, age, and progressive dementia put her at greater risk of falls. Patient's baseline HR is also intermittently bradycardic, giving pause to the use of beta blockers for rate control. Team has consulted cardiology at this time to help evaluate patient and provide recommendations, including the possible placement of a pacemaker. Briefly explained the indication of a pacemaker to patient and her daughter. Although patient exhibited limited insight to the need for possible surgery, daughter is open to possibility of a pacemaker if cardiology deems it necessary. Appreciate cardiology's recommendations.    Infectious cause less likely given lack of dysuria, fever, or nitrites on UA. Patient voiding without issue per daughter. EEG not completed. Will complete workup for infectious and neurological causes and follow up as needed.  Plan:  - Continue Telemetry  - F/u Echocardiogram  - F/u EEG  - F/u Orthostatic vital signs - F/u bladder scan  - Monitor daily labs  - Follow up cardiology recommendations   Hypomagnesemia Magnesium 1.2 on admission, only comparison 2.5 from 3 years ago. Repleted with IV magnesium 4 g for 1x dose. Magnesium today 2.6.  - Continue to monitor   Normocytic anemia Hgb on admission 11.1, MCV 90.5, platelets 148. Hgb from 02/22/22 12.8, MCV 95.9, platelets 183. Hgb today 9.9, no overt bleeding on exam. B12, folate, and iron panels WNL. May be related to CKD, will monitor on daily labs.    HTN Chronic. BP here 120s/80s, will continue holding lisinopril 40 mg daily for now and resume as needed.   HLD Chronic. Lipid profile from 04/28/23 with LDL 104. Held home lovastatin 30 mg daily at bedtime until CK came back, showing total CK 90 WNL. Consider restarting.    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 Serum Cr in May 2024 1.3. Today serum Cr 1.8. Patient previously seen by nephrologist at [facility] nephrology in 2023, PCP recommending follow up with them. US renal from 2023 showing normal bladder, right kidney cyst with a single septation and left kidney with multiple simple cysts, no follow-up imaging recommended.    Patient with ketones on UA, may indicate slight dehydration. Patient's daughter will continue to encourage her to drink water. Will defer IV fluids for now. Continues to deny dysuria and suprapubic pain on exam. Will follow up on bladder scan and continue to avoid nephrotoxic agents.  - Trend Cr  - Follow up  post void bladder scan    Dementia Delirium/intermittent agitation  Chronic. Not on any home medications. No feeding or swallowing difficulties reported. Per patient's daughter, patient is able to answer questions but her answers may not be reliable. Discussed increased risk of delirium in the hospital, discussed reorientation strategies. Patient's daughter and/or family will remain at bedside. Will continue verbal redirection strategies, bed alarm, and Trazodone 50 mg daily at bedtime to help with agitation.   Diet: Regular  Code Status: Full Code VTE Prophylaxis: sq Enoxaparin/Lovenox 30 mg daily    Prior to Admission Living Arrangement: Home Anticipated Discharge Location: Pending medical management Barriers to Discharge: Pending medical management Dispo: Anticipated discharge in approximately 1-2 day(s).   Philomena Doheny, MD, PGY-1 05/02/2023, 10:25 AM Pager: [phone] After 5pm on weekdays and 1pm on weekends: On Call pager [phone]

## 2023-05-02 NOTE — Plan of Care (Signed)
  Problem: Clinical Measurements: Goal: Ability to maintain clinical measurements within normal limits will improve Outcome: Progressing Goal: Will remain free from infection Outcome: Progressing Goal: Diagnostic test results will improve Outcome: Progressing Goal: Respiratory complications will improve Outcome: Progressing Goal: Cardiovascular complication will be avoided Outcome: Progressing   Problem: Activity: Goal: Risk for activity intolerance will decrease Outcome: Progressing   Problem: Nutrition: Goal: Adequate nutrition will be maintained Outcome: Progressing   Problem: Coping: Goal: Level of anxiety will decrease Outcome: Progressing   Problem: Elimination: Goal: Will not experience complications related to bowel motility Outcome: Progressing Goal: Will not experience complications related to urinary retention Outcome: Progressing   Problem: Pain Management: Goal: General experience of comfort will improve Outcome: Progressing   Problem: Safety: Goal: Ability to remain free from injury will improve Outcome: Progressing   Problem: Skin Integrity: Goal: Risk for impaired skin integrity will decrease Outcome: Progressing   Problem: Education: Goal: Knowledge of General Education information will improve Description: Including pain rating scale, medication(s)/side effects and non-pharmacologic comfort measures Outcome: Not Progressing   Problem: Health Behavior/Discharge Planning: Goal: Ability to manage health-related needs will improve Outcome: Not Progressing  Patient has dementia and is only oriented to self. Patient's education will be reinforce.

## 2023-05-02 NOTE — Consult Note (Addendum)
Cardiology Consultation   Patient ID: Christine Johns MRN: [id number]; DOB: 05-14-1941  Admit date: 05/01/2023 Date of Consult: 05/02/2023  PCP:  Marisue Ivan, MD   [facility] Providers Cardiologist:  None   {  Patient Profile:   Christine Johns is a 82 y.o. female with a hx of paroxysmal atrial fibrillation, CKD, presyncope, hypertension, hyperlipidemia, dementia who is being seen 05/02/2023 for the evaluation of syncope at the request of teaching services.  History of Present Illness:   Christine Johns has history of paroxysmal atrial fibrillation since October 2021.  During this admission she had presented with a presyncopal episode and was noted to be in A-fib RVR in the setting of severe electrolyte abnormalities with hypokalemia less than 2 and hypomagnesemia 1.5.  She was started on IV Cardizem and had converted back to normal sinus rhythm and started on Eliquis.  Does not appear that cardiology had formally saw, no notes in chart.  During same admission she had UTI with positive cultures for Klebsiella pneumonia.  Recommendations were to see outpatient with our atrial fibrillation clinic, do not see any follow-up notes.  Currently patient is being admitted for a syncopal episode that occurred yesterday while in the car.  Per reports family said that she had no preceding symptoms prior to this and had been acting normally.  They went into the car and drove for approximately 25 to 30 minutes and went to check on her and noticed that she was not getting on the car.  Daughter reports that she was unresponsive to verbal or physical stimuli.  During this episode she also had an episode of defecation.  She remained to be confused and lethargic/somnolent for another 20 to 30 minutes after this episode before returning back to baseline.  Daughter reported significant lack of muscle tone and weakness.  Did not note any slurred speech.  There has not been any clear  shaking/rigidity.  Patient has dementia and is confused at baseline and has no recollection of any of these events.  However she denies any significant complaints such as chest pain, palpitations, dizziness, syncope, peripheral edema, tachycardia prior to this episode.  Daughter does report that she has had significant urges of needing to urinate however not being able to for the past 2 to 3 months.  She also correlates this with increased confusion.  Daughter also reports that PCP had stopped her diltiazem and her Eliquis since 2021 for unknown reasons.  She has numerous electrolyte abnormalities on admission, family reports poor p.o. intake.  Patient reports no falls at home and is functional and ambulates.  Appears that she might have a UTI with large leukocytosis on UA.  Ketones 5.  Urine cultures pending, not on antibiotics yet.  Magnesium 1.2 now corrected 2.6.  Troponins negative.  CK 90.  Current potassium 3.1 creatinine 1.8.  Normal WBC.  Hemoglobin 9.9.  Past Medical History:  Diagnosis Date   Dementia (HCC)    Hypertension     History reviewed. No pertinent surgical history.    Inpatient Medications: Scheduled Meds:  enoxaparin (LOVENOX) injection  30 mg Subcutaneous Q24H   traZODone  50 mg Oral QHS   Continuous Infusions:  PRN Meds:   Allergies:    Allergies  Allergen Reactions   Atorvastatin Other (See Comments)   Penicillins Itching and Swelling   Sulfa Antibiotics Itching and Swelling    Social History:   Social History   Socioeconomic History   Marital status: Married  Spouse name: Not on file   Number of children: Not on file   Years of education: Not on file   Highest education level: Not on file  Occupational History   Not on file  Tobacco Use   Smoking status: Never   Smokeless tobacco: Never  Substance and Sexual Activity   Alcohol use: Never   Drug use: Never   Sexual activity: Not on file  Other Topics Concern   Not on file  Social History  Narrative   Not on file   Social Determinants of Health   Financial Resource Strain: Low Risk  (04/29/2023)   Received from [facility]   Overall Financial Resource Strain (CARDIA)    Difficulty of Paying Living Expenses: Not hard at all  Food Insecurity: No Food Insecurity (05/01/2023)   Hunger Vital Sign    Worried About Running Out of Food in the Last Year: Never true    Ran Out of Food in the Last Year: Never true  Transportation Needs: No Transportation Needs (05/01/2023)   PRAPARE - Administrator, Civil Service (Medical): No    Lack of Transportation (Non-Medical): No  Physical Activity: Not on file  Stress: Not on file  Social Connections: Not on file  Intimate Partner Violence: Not At Risk (05/01/2023)   Humiliation, Afraid, Rape, and Kick questionnaire    Fear of Current or Ex-Partner: No    Emotionally Abused: No    Physically Abused: No    Sexually Abused: No    Family History:    Family History  Problem Relation Age of Onset   Breast cancer Neg Hx      ROS:  Please see the history of present illness.   All other ROS reviewed and negative.     Physical Exam/Data:   Vitals:   05/02/23 0002 05/02/23 0422 05/02/23 0427 05/02/23 0755  BP: (!) 118/55 (!) 151/71  102/67  Pulse: 69 (!) 45  66  Resp: 16 18  16   Temp: 97.8 F (36.6 C) 98.2 F (36.8 C)  97.7 F (36.5 C)  TempSrc: Oral Oral    SpO2: 98% 99%  99%  Weight:   51.3 kg   Height:        Intake/Output Summary (Last 24 hours) at 05/02/2023 0936 Last data filed at 05/02/2023 0446 Gross per 24 hour  Intake 246.4 ml  Output 650 ml  Net -403.6 ml      05/02/2023    4:27 AM 05/01/2023    1:42 PM 03/29/2020    2:26 AM  Last 3 Weights  Weight (lbs) 113 lb 1.5 oz 130 lb 1.1 oz 131 lb 13.4 oz  Weight (kg) 51.3 kg 59 kg 59.8 kg     Body mass index is 20.03 kg/m.  General:  Well nourished, well developed, in no acute distress. Confused  HEENT: normal Neck: no  JVD Vascular: No carotid bruits; Distal pulses 2+ bilaterally Cardiac:  normal S1, S2; RRR; no murmur  Lungs:  clear to auscultation bilaterally, no wheezing, rhonchi or rales  Abd: soft, nontender, no hepatomegaly  Ext: no edema Musculoskeletal:  No deformities, BUE and BLE strength normal and equal Skin: warm and dry  Neuro:  CNs 2-12 intact, no focal abnormalities noted Psych:  Normal affect   EKG:  The EKG was personally reviewed and demonstrates: Normal sinus rhythm, PACs.  Heart rate 81.  Incomplete right bundle branch block.  No acute ST-T wave changes. Telemetry:  Telemetry was  personally reviewed and demonstrates: Sinus rhythm with PACs.  Possible episode of atrial fibrillation, difficult to say due to frequent artifact.  Patient is agitated and frequently removes and touches leads. Relevant CV Studies: Echocardiogram 05/02/2023 pending  Laboratory Data:  High Sensitivity Troponin:   Recent Labs  Lab 05/01/23 1845 05/01/23 1923  TROPONINIHS 9 11     Chemistry Recent Labs  Lab 05/01/23 1436 05/02/23 0544  NA 134* 135  K 4.2 3.1*  CL 104 107  CO2 21* 18*  GLUCOSE 95 123*  BUN 23 20  CREATININE 1.80* 1.70*  CALCIUM 9.3 9.1  MG 1.2* 2.6*  GFRNONAA 27* 29*  ANIONGAP 9 10    Recent Labs  Lab 05/01/23 1436 05/02/23 0544  PROT 6.5  --   ALBUMIN 3.5 3.2*  AST 28  --   ALT 13  --   ALKPHOS 45  --   BILITOT 1.0  --    Lipids No results for input(s): "CHOL", "TRIG", "HDL", "LABVLDL", "LDLCALC", "CHOLHDL" in the last 168 hours.  Hematology Recent Labs  Lab 05/01/23 1436 05/02/23 0544  WBC 7.8 5.4  RBC 3.59* 3.24*  HGB 11.1* 9.9*  HCT 32.5* 29.0*  MCV 90.5 89.5  MCH 30.9 30.6  MCHC 34.2 34.1  RDW 12.4 12.2  PLT 148* 139*   Thyroid No results for input(s): "TSH", "FREET4" in the last 168 hours.  BNPNo results for input(s): "BNP", "PROBNP" in the last 168 hours.  DDimer No results for input(s): "DDIMER" in the last 168 hours.   Radiology/Studies:   No results found.   Assessment and Plan:   Syncope She is presenting with concerning symptoms of possible seizure-like activity given unresponsiveness with tactile and verbal stimuli with postictal confusion lasting 20 to 30 minutes with an episode of defecation.  Additionally, stroke cannot be ruled out given that she has not been on anticoagulation since 2021 in which she she had similar episode of syncopal episode in the setting of atrial fibrillation RVR and had a UTI as well as severe electrolyte abnormalities, very consistent with today's episode.  In short I think her symptoms may be more consistent with seizure, stroke, UTI.  There is a lower suspicion for this to be cardiogenic however can continue to monitor telemetry for any significant arrhythmias.  Can consider monitor outpatient, we will follow-up on echocardiogram for any valvular disease.  Would recommend some sort of brain imaging.  Paroxysmal atrial fibrillation She may have had an episode of atrial fibrillation with RVR today, difficult to see exactly with frequent ectopy and artifact.  Euvolemic.  It is possible patient may have had RVR episode and potentially could have had posttermination pause however this does not seem consistent with her presentation with continued confusion and defecation.  I do not think this is related to her syncopal episode. Asymptomatic, maintaining normal sinus rhythm with frequent PACs.   Will start Eliquis 2.5 mg twice daily (age, renal function, <60kg).  She has no clear contraindications to this and does not fall at home. Need to carefully monitor. Not initiating a beta-blocker at this time due to well-controlled rates, brief episode, borderline hypotension. May consider cardiac monitor to further define A-fib burden as well as her ectopy.  Hypomagnesemia 1.2 on admission corrected now 2.6.  Maintain potassium greater than 4, magnesium greater than 2.  This is contributing to her  ectopy.  Possible UTI Describing increased urge to urinate but unable to do so.  Large leukocytosis on UA.  Defer to  primary team to initiate antibiotics  Hypertension Soft pressures here.  Agree with holding antihypertensives.   Risk Assessment/Risk Scores:   CHA2DS2-VASc Score = 4  This indicates a 4.8% annual risk of stroke. The patient's score is based upon: CHF History: 0 HTN History: 1 Diabetes History: 0 Stroke History: 0 Vascular Disease History: 0 Age Score: 2 Gender Score: 1       For questions or updates, please contact [facility] Please consult [email or web address] for contact info under    Signed, Abagail Kitchens, PA-C  05/02/2023 9:36 AM

## 2023-05-02 NOTE — Plan of Care (Signed)

## 2023-05-02 NOTE — Telephone Encounter (Signed)
Currently patient admitted for an episode of syncope.  Teaching services has requested 2-week monitor.  Scheduling follow-up approximately 1 month from today to ensure follow-up provider has results.  I have scheduled follow-up with our [place] office since she resides there.  Dr. Wyline Mood to read.

## 2023-05-02 NOTE — Progress Notes (Signed)
EEG complete - results pending 

## 2023-05-02 NOTE — Care Management Obs Status (Signed)
MEDICARE OBSERVATION STATUS NOTIFICATION   Patient Details  Name: Christine Johns MRN: [id number] Date of Birth: Jul 18, 1940   Medicare Observation Status Notification Given:  Yes    Janae Bridgeman, RN 05/02/2023, 11:47 AM

## 2023-05-02 NOTE — Telephone Encounter (Signed)
Patient Product/process development scientist completed.    The patient is insured through [employer]. Patient has Medicare and is not eligible for a copay card, but may be able to apply for patient assistance, if available.    Ran test claim for Eliquis 2.5 mg and the current 30 day co-pay is $40.00.   This test claim was processed through [facility]- copay amounts may vary at other pharmacies due to pharmacy/plan contracts, or as the patient moves through the different stages of their insurance plan.     [name], CPHT Pharmacy Technician III Certified Patient Advocate [facility] Patient Advocate Team Direct Number: [phone]  Fax: [phone]

## 2023-05-02 NOTE — Progress Notes (Signed)
  Echocardiogram 2D Echocardiogram has been performed.  Ocie Doyne RDCS 05/02/2023, 10:58 AM

## 2023-05-02 NOTE — Discharge Summary (Signed)
Name: Christine Johns MRN: [id number] DOB: 1940-12-12 82 y.o. PCP: Marisue Ivan, MD  Date of Admission: 05/01/2023  1:13 PM Date of Discharge: 05/02/2023 Attending Physician: Dr. Criselda Peaches  Discharge Diagnosis: Principal Problem:   Syncope    Discharge Medications: Allergies as of 05/02/2023       Reactions   Atorvastatin Other (See Comments)   Penicillins Itching, Swelling   Sulfa Antibiotics Itching, Swelling        Medication List     STOP taking these medications    hydrochlorothiazide 12.5 MG tablet Commonly known as: HYDRODIURIL   lisinopril 40 MG tablet Commonly known as: ZESTRIL       TAKE these medications    apixaban 2.5 MG Tabs tablet Commonly known as: ELIQUIS Take 1 tablet (2.5 mg total) by mouth 2 (two) times daily.   lovastatin 20 MG tablet Commonly known as: MEVACOR Take 30 mg by mouth at bedtime.   Multi-Vitamin tablet Take 1 tablet by mouth daily.   Potassium 99 MG Tabs Take 1 tablet by mouth daily.   Vitamin D 125 MCG (5000 UT) Caps Take 1 capsule by mouth daily.        Disposition and follow-up:   Ms.Christine Johns was discharged from [facility] in Good condition.  At the hospital follow up visit please address:  1.  Follow-up:  a. Repeat urinalysis and assess for any UTI symptoms.    b. Repeat BMP and check BP. Consider restarting lisinopril or trying a different class if her kidney function is not improved.   c. Ensure she has cardiology follow up with either [facility] who she saw while at [facility] or with [facility]. If she has not gotten it we recommend a Zio patch to monitor for arrhythmia.  Follow-up Appointments:  Follow-up Information     Monge, Petra Kuba, NP Follow up.   Specialties: Cardiology, Family Medicine Why: Tuesday Jun 03, 2023 Appt at 8:25 AM (25 min) Contact information: [address] [place] [address] [phone]         Care, [facility]  Follow up.   Specialty: Home Health Services Why: [facility] will provide you with home health services.  They will call you in the next 24-48 hours to set up services. Contact information: [address] [place] [address] [phone]                 Hospital Course by problem list: Christine Johns. Tutor is a 82 yo with a medical history of HTN, HLD, CKD IV, dementia, and remote history of hospitalization for Afib with RVR in 2021 not on anticoagulation who presented to the ED for syncope and admitted to the Internal Medicine Teaching Service on 11/28 for further work-up.   Syncope Paroxysmal A fib Patient admitted after single episode of syncope with some post-syncopal confusion with a return to baseline around 20-25 minutes after the event.  Workup here with low concerns for ACS, seizure, PE, stroke, sepsis.  EKG without ischemia, troponins low, EEG negative, low Wells score, no changes on head CT, no leukocytosis, no fever, no UTI symptoms.  She has a history of A-fib with RVR back in 2021 and was put on anticoagulation but taken off for an unknown reason.  Cardiac telemetry here showed predominantly sinus rhythm with frequent PACs and some PVCs but no definite prolonged atrial fibrillation.  However due to her high CHA2DS2-VASc score and relatively low bleeding risk we  started her on Eliquis.  She was discharged with cardiology follow-up and plans to get a Zio patch for further monitoring.  Hypomagnesemia Magnesium 1.2 on admission, only comparison 2.5 from 3 years ago. Repleted with IV magnesium 4 g for 1x dose. Magnesium today 2.6. Monitored.   Normocytic anemia Hgb on admission 11.1, MCV 90.5, platelets 148. Hgb from 02/22/22 12.8, MCV 95.9, platelets 183. Hgb today 9.9, no overt bleeding on exam. B12, folate, and iron panels WNL. May be related to CKD.   HTN Chronic. BP here 120s/80s, held lisinopril 40 mg daily due to AKI.  Recommend repeating BMP on follow-up and  restarting lisinopril if improved or trying a different class of antihypertensives.   HLD Chronic. Lipid profile from 04/28/23 with LDL 104. Held home lovastatin 30 mg daily at bedtime until CK came back, showing total CK 90 WNL.  Statin was resumed.   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 Serum Cr in May 2024 1.3.  Creatinine admission up to 1.8 and on the following day and 1.7.  Patient previously seen by nephrologist at [facility] nephrology in 2023, PCP recommending follow up with them. US renal from 2023 showing normal bladder, right kidney cyst with a single septation and left kidney with multiple simple cysts, no follow-up imaging recommended.  Her creatinine may be her new baseline with CKD 4, this will need to be followed with repeat BMP on follow-up.  Dementia Delirium/intermittent agitation  Chronic. Not on any home medications. No feeding or swallowing difficulties reported. Per patient's daughter, patient is able to answer questions but her answers may not be reliable. Discussed increased risk of delirium in the hospital, discussed reorientation strategies. Patient's daughter remained at bedside during admission. Patient redirectable with verbal cues and prompting, bed alarm, and Trazodone 50 mg daily at bedtime to help with agitation while in the hospital. Discontinued on discharge but pt did respond well to trazodone without significant daytime effects.   Discharge Subjective: Patient is awake in bed with daughter at bedside. Patient denies any acute pain, including CP or dysuria, or concern. Daughter states patient was mostly awake all night but redirectable, states medication given during agitation event was helpful. Discussed lab and imaging results, the need to collect more information today, and cardiology consult. Patient and patient's daughter endorsed understanding.    Discharge Exam:   BP 124/72 (BP Location: Right Arm)   Pulse 100   Temp 97.7 F (36.5 C)   Resp (!) 25   Ht 5\' 3"  (1.6 m)   Wt 51.3 kg   SpO2 99%   BMI 20.03 kg/m  Constitutional: well-appearing elderly female sitting in bed, in no acute distress Cardiovascular: regular rate and rhythm Pulmonary/Chest: normal work of breathing on room air Neurological: alert & oriented x3, no focal deficits   Pertinent Labs, Studies, and Procedures:     Latest Ref Rng & Units 05/02/2023    5:44 AM 05/01/2023    2:36 PM 03/28/2020    5:14 AM  CBC  WBC 4.0 - 10.5 K/uL 5.4  7.8  9.4   Hemoglobin 12.0 - 15.0 g/dL 9.9  16.1  09.6   Hematocrit 36.0 - 46.0 % 29.0  32.5  29.9   Platelets 150 - 400 K/uL 139  148  192        Latest Ref Rng & Units 05/02/2023  5:44 AM 05/01/2023    2:36 PM 03/29/2020    6:22 AM  CMP  Glucose 70 - 99 mg/dL 347  95  89   BUN 8 - 23 mg/dL 20  23  16    Creatinine 0.44 - 1.00 mg/dL 4.25  9.56  3.87   Sodium 135 - 145 mmol/L 135  134  141   Potassium 3.5 - 5.1 mmol/L 3.1  4.2  4.2   Chloride 98 - 111 mmol/L 107  104  114   CO2 22 - 32 mmol/L 18  21  21    Calcium 8.9 - 10.3 mg/dL 9.1  9.3  8.1   Total Protein 6.5 - 8.1 g/dL  6.5    Total Bilirubin <1.2 mg/dL  1.0    Alkaline Phos 38 - 126 U/L  45    AST 15 - 41 U/L  28    ALT 0 - 44 U/L  13      CT HEAD WO CONTRAST ( )  Result Date: 05/02/2023 CLINICAL DATA:  Provided history: Syncope/presyncope, cerebrovascular cause suspected. EXAM: CT HEAD WITHOUT CONTRAST TECHNIQUE: Contiguous axial images were obtained from the base of the skull through the vertex without intravenous contrast. RADIATION DOSE REDUCTION: This exam was performed according to the departmental dose-optimization program which includes automated exposure control, adjustment of the mA and/or kV according to patient size and/or use of iterative reconstruction technique. COMPARISON:  None. FINDINGS: Brain: Mild generalized cerebral atrophy.  Patchy and ill-defined hypoattenuation within the cerebral white matter, nonspecific but compatible with moderate chronic small vessel ischemic disease. Chronic lacunar infarct within the left basal ganglia, new from the prior head CT of 03/27/2020. Chronic infarcts within the bilateral cerebellar hemispheres, unchanged. There is no acute intracranial hemorrhage. No demarcated cortical infarct. No extra-axial fluid collection. No evidence of an intracranial mass. No midline shift. Vascular: No hyperdense vessel.  Atherosclerotic calcifications. Skull: No calvarial fracture or aggressive osseous lesion. Sinuses/Orbits: No mass or acute finding within the imaged orbits. Minimal secretions has small volume secretions scattered within bilateral ethmoid air cells. IMPRESSION: 1. No evidence of an acute intracranial abnormality. 2. Chronic left basal ganglia lacunar infarct, new from the prior head CT of 03/27/2020. 3. Moderate chronic small vessel ischemic changes within the cerebral white matter. 4. Unchanged chronic infarcts within the bilateral cerebellar hemispheres. 5. Mild generalized cerebral atrophy. 6. Minor bilateral ethmoid sinus disease. Electronically Signed   By: Jackey Loge D.O.   On: 05/02/2023 14:26   EEG adult  Result Date: 05/02/2023 Charlsie Quest, MD     05/02/2023 11:50 AM Patient Name: ALEXIZ FRERKING MRN: [id number] Epilepsy Attending: Charlsie Quest Referring Physician/Provider: Rocky Morel, DO Date: 05/02/2023 Duration: 26.28 mins Patient history: 82 yo F with syncope getting eeg to evaluate for seizure Level of alertness: Awake AEDs during EEG study: None Technical aspects: This EEG study was done with scalp electrodes positioned according to the 10-20 International system of electrode placement. Electrical activity was reviewed with band pass filter of 1-70Hz , sensitivity of 7 uV/mm, display speed of 42mm/sec with a 60Hz  notched filter applied as appropriate. EEG data were recorded  continuously and digitally stored.  Video monitoring was available and reviewed as appropriate. Description: The posterior dominant rhythm consists of 7 Hz activity of moderate voltage (25-35 uV) seen predominantly in posterior head regions, symmetric and reactive to eye opening and eye closing. EEG showed continuous generalized 3 to 6 Hz theta-delta slowing. Hyperventilation and photic stimulation were not performed.  ABNORMALITY - Continuous slow, generalized IMPRESSION: This study is suggestive of mild to moderate diffuse encephalopathy. No seizures or epileptiform discharges were seen throughout the recording. Charlsie Quest   ECHOCARDIOGRAM COMPLETE  Result Date: 05/02/2023    ECHOCARDIOGRAM REPORT   Patient Name:   TIFFONY CLARIZIO Date of Exam: 05/02/2023 Medical Rec #:  [id number]     Height:       63.0 in Accession #:    [id number]    Weight:       113.1 lb Date of Birth:  September 19, 1940     BSA:          1.518 m Patient Age:    82 years      BP:           102/67 mmHg Patient Gender: F             HR:           78 bpm. Exam Location:  Inpatient Procedure: 2D Echo, Cardiac Doppler and Color Doppler Indications:    Syncope  History:        Patient has prior history of Echocardiogram examinations, most                 recent 03/28/2020. Arrythmias:Atrial Fibrillation,                 Signs/Symptoms:Syncope; Risk Factors:Dyslipidemia and                 Hypertension.  Sonographer:    Vern Claude Referring Phys: [id number] [employer] COUNTRYMAN IMPRESSIONS  1. Left ventricular ejection fraction, by estimation, is 55 to 60%. The left ventricle has normal function. The left ventricle has no regional wall motion abnormalities. Left ventricular diastolic parameters are consistent with Grade I diastolic dysfunction (impaired relaxation).  2. Right ventricular systolic function is normal. The right ventricular size is normal. There is normal pulmonary artery systolic pressure.  3. The mitral valve is grossly normal. Mild  mitral valve regurgitation.  4. The aortic valve is tricuspid. Aortic valve regurgitation is not visualized.  5. The inferior vena cava is normal in size with greater than 50% respiratory variability, suggesting right atrial pressure of 3 mmHg. Conclusion(s)/Recommendation(s): There is no structural cardiac reason for syncope. FINDINGS  Left Ventricle: Left ventricular ejection fraction, by estimation, is 55 to 60%. The left ventricle has normal function. The left ventricle has no regional wall motion abnormalities. The left ventricular internal cavity size was normal in size. There is  no left ventricular hypertrophy. Left ventricular diastolic parameters are consistent with Grade I diastolic dysfunction (impaired relaxation). Right Ventricle: The right ventricular size is normal. Right ventricular systolic function is normal. There is normal pulmonary artery systolic pressure. The tricuspid regurgitant velocity is 2.28 m/s, and with an assumed right atrial pressure of 3 mmHg,  the estimated right ventricular systolic pressure is 23.8 mmHg. Left Atrium: Left atrial size was normal in size. Right Atrium: Right atrial size was normal in size. Pericardium: There is no evidence of pericardial effusion. Mitral Valve: The mitral valve is grossly normal. Mild mitral valve regurgitation. MV peak gradient, 3.3 mmHg. The mean mitral valve gradient is 1.0 mmHg. Tricuspid Valve: Tricuspid valve regurgitation is trivial. Aortic Valve: The aortic valve is tricuspid. Aortic valve regurgitation is not visualized. Aortic valve mean gradient measures 1.7 mmHg. Aortic valve peak gradient measures 3.5 mmHg. Aortic valve area, by VTI measures 2.02 cm. Pulmonic Valve: Pulmonic valve regurgitation is mild. Aorta: The aortic root and  ascending aorta are structurally normal, with no evidence of dilitation. Venous: The inferior vena cava is normal in size with greater than 50% respiratory variability, suggesting right atrial pressure of 3  mmHg. IAS/Shunts: No atrial level shunt detected by color flow Doppler.  LEFT VENTRICLE PLAX 2D LVIDd:         4.40 cm     Diastology LVIDs:         3.10 cm     LV e' medial:    7.29 cm/s LV PW:         0.60 cm     LV E/e' medial:  11.0 LV IVS:        0.80 cm     LV e' lateral:   9.03 cm/s LVOT diam:     1.70 cm     LV E/e' lateral: 8.9 LV SV:         34 LV SV Index:   23 LVOT Area:     2.27 cm  LV Volumes (MOD) LV vol d, MOD A2C: 75.4 ml LV vol d, MOD A4C: 83.1 ml LV vol s, MOD A2C: 24.5 ml LV vol s, MOD A4C: 30.9 ml LV SV MOD A2C:     50.9 ml LV SV MOD A4C:     83.1 ml LV SV MOD BP:      54.1 ml RIGHT VENTRICLE RV Basal diam:  3.30 cm RV Mid diam:    2.80 cm RV S prime:     13.70 cm/s TAPSE (M-mode): 2.4 cm LEFT ATRIUM           Index        RIGHT ATRIUM           Index LA diam:      2.10 cm 1.38 cm/m   RA Area:     11.80 cm LA Vol (A2C): 18.3 ml 12.06 ml/m  RA Volume:   27.00 ml  17.79 ml/m LA Vol (A4C): 19.0 ml 12.52 ml/m  AORTIC VALVE                    PULMONIC VALVE AV Area (Vmax):    1.64 cm     PV Vmax:       0.92 m/s AV Area (Vmean):   1.80 cm     PV Peak grad:  3.4 mmHg AV Area (VTI):     2.02 cm AV Vmax:           94.17 cm/s AV Vmean:          59.900 cm/s AV VTI:            0.169 m AV Peak Grad:      3.5 mmHg AV Mean Grad:      1.7 mmHg LVOT Vmax:         67.90 cm/s LVOT Vmean:        47.500 cm/s LVOT VTI:          0.151 m LVOT/AV VTI ratio: 0.89  AORTA Ao Root diam: 3.30 cm Ao Asc diam:  3.00 cm MITRAL VALVE                TRICUSPID VALVE MV Area (PHT): 2.72 cm     TR Peak grad:   20.8 mmHg MV Area VTI:   1.67 cm     TR Vmax:        228.00 cm/s MV Peak grad:  3.3 mmHg MV Mean grad:  1.0 mmHg  SHUNTS MV Vmax:       0.90 m/s     Systemic VTI:  0.15 m MV Vmean:      48.6 cm/s    Systemic Diam: 1.70 cm MV Decel Time: 279 msec MR Peak grad: 77.1 mmHg MR Mean grad: 51.0 mmHg MR Vmax:      439.00 cm/s MR Vmean:     336.0 cm/s MV E velocity: 80.50 cm/s MV A velocity: 102.00 cm/s MV E/A ratio:   0.79 Photographer signed by Carolan Clines Signature Date/Time: 05/02/2023/11:06:46 AM    Final      Discharge Instructions: Discharge Instructions     Diet - low sodium heart healthy   Complete by: As directed    Increase activity slowly   Complete by: As directed        Signed: Rocky Morel, DO Internal Medicine Resident, PGY-2 Pager# [phone] 05/02/2023, 3:09 PM   Please contact the on call pager after 5 pm and on weekends at [phone].

## 2023-05-02 NOTE — Progress Notes (Signed)
Addendum plan: Per teaching services, will discharge on 2-week monitor and will have her follow-up an clinic.  I will schedule follow-up roughly 1 month from today to ensure follow-up provider has results.

## 2023-05-02 NOTE — Procedures (Addendum)
Patient Name: MALIYHA SHELTON  MRN: [id number]  Epilepsy Attending: Charlsie Quest  Referring Physician/Provider: Rocky Morel, DO  Date: 05/02/2023 Duration: 26.28 mins  Patient history: 82 yo F with syncope getting eeg to evaluate for seizure  Level of alertness: Awake  AEDs during EEG study: None  Technical aspects: This EEG study was done with scalp electrodes positioned according to the 10-20 International system of electrode placement. Electrical activity was reviewed with band pass filter of 1-70Hz , sensitivity of 7 uV/mm, display speed of 70mm/sec with a 60Hz  notched filter applied as appropriate. EEG data were recorded continuously and digitally stored.  Video monitoring was available and reviewed as appropriate.  Description: The posterior dominant rhythm consists of 7 Hz activity of moderate voltage (25-35 uV) seen predominantly in posterior head regions, symmetric and reactive to eye opening and eye closing. EEG showed continuous generalized 3 to 6 Hz theta-delta slowing. Hyperventilation and photic stimulation were not performed.     ABNORMALITY - Continuous slow, generalized  IMPRESSION: This study is suggestive of mild to moderate diffuse encephalopathy. No seizures or epileptiform discharges were seen throughout the recording.  Emalina Dubreuil Annabelle Harman

## 2023-05-02 NOTE — Evaluation (Signed)
Occupational Therapy Evaluation Patient Details Name: Christine Johns MRN: [id number] DOB: [birth year] Today's Date: 05/02/2023   History of Present Illness Christine Johns is a 82 yo female who presented after a syncopal episode at home. Found to have UTI. PMHx: paroxysmal atrial fibrillation, CKD, presyncope, hypertension, hyperlipidemia, dementia   Clinical Impression   Christine Johns was evaluated s/p the above admission list. She lives with her husband and is mod I for ADLs and indep for mobility at baseline, family assists with IADLs. Upon evaluation the pt was limited by baseline cognition with pleasant confusion throughout, generalized weakness and mildly unsteady gait. Overall she needed generalized superivsion for functional mobility and several transfers without AD. Due to the deficits listed below the pt also needs up to supervision A for ADLs. Pt did benefit from cues throughout due to baseline dementia; per pt's daughter pt is having increased confusion since being in the unfamiliar environment of the hospital. Pt will benefit from continued acute OT services and discharge home with support of family.         If plan is discharge home, recommend the following: Supervision due to cognitive status;Assist for transportation;Assistance with cooking/housework;Assistance with feeding;Direct supervision/assist for medications management;Direct supervision/assist for financial management    Functional Status Assessment  Patient has had a recent decline in their functional status and demonstrates the ability to make significant improvements in function in a reasonable and predictable amount of time.  Equipment Recommendations  None recommended by OT       Precautions / Restrictions Precautions Precautions: Fall Restrictions Weight Bearing Restrictions: No      Mobility Bed Mobility Overal bed mobility: Modified Independent             General bed mobility comments: HOB elevated     Transfers Overall transfer level: Needs assistance Equipment used: None, Rolling walker (2 wheels) Transfers: Sit to/from Stand Sit to Stand: Supervision          Balance Overall balance assessment: Mild deficits observed, not formally tested             ADL either performed or assessed with clinical judgement   ADL Overall ADL's : Needs assistance/impaired Eating/Feeding: Independent   Grooming: Supervision/safety;Standing   Upper Body Bathing: Supervision/ safety   Lower Body Bathing: Supervison/ safety   Upper Body Dressing : Supervision/safety   Lower Body Dressing: Supervision/safety   Toilet Transfer: Supervision/safety;Ambulation;Regular Toilet   Toileting- Clothing Manipulation and Hygiene: Supervision/safety;Sitting/lateral lean       Functional mobility during ADLs: Supervision/safety General ADL Comments: no physical assist noted, attempted functional mobility with RW initally, pt had difficulty with management. ambulated with supervision A without AD. sequenced through basic ADLs without cues     Vision Baseline Vision/History: 0 No visual deficits Vision Assessment?: No apparent visual deficits     Perception Perception: Not tested       Praxis Praxis: Not tested       Pertinent Vitals/Pain Pain Assessment Pain Assessment: No/denies pain     Extremity/Trunk Assessment Upper Extremity Assessment Upper Extremity Assessment: Generalized weakness   Lower Extremity Assessment Lower Extremity Assessment: Defer to PT evaluation   Cervical / Trunk Assessment Cervical / Trunk Assessment: Kyphotic   Communication Communication Communication: No apparent difficulties   Cognition Arousal: Alert Behavior During Therapy: WFL for tasks assessed/performed Overall Cognitive Status: History of cognitive impairments - at baseline       General Comments: hx of dementia; pleasantly confused. Follows all commands.     General  Comments  VSS,  dtr in room            Home Living Family/patient expects to be discharged to:: Private residence Living Arrangements: Spouse/significant other Available Help at Discharge: Family;Available 24 hours/day Type of Home: House Home Access: Stairs to enter [employer] of Steps: 2   Home Layout: One level     Bathroom Shower/Tub: Chief Strategy Officer: Standard     Home Equipment: TEFL teacher Comments: dtr says pt and her husband "help each other." Husband drives      Prior Functioning/Environment Prior Level of Function : Independent/Modified Independent             Mobility Comments: no AD, denies falls (dtr confirms ) ADLs Comments: mod I for BADLs, husband drives and family assists with IADLs        OT Problem List: Decreased activity tolerance;Impaired balance (sitting and/or standing)      OT Treatment/Interventions: Self-care/ADL training;DME and/or AE instruction;Patient/family education    OT Goals(Current goals can be found in the care plan section) Acute Rehab OT Goals Patient Stated Goal: home OT Goal Formulation: With patient Time For Goal Achievement: 05/16/23 Potential to Achieve Goals: Good ADL Goals Additional ADL Goal #1: pt will complete all BADLs with mod I  OT Frequency: Min 1X/week       AM-PAC OT "6 Clicks" Daily Activity     Outcome Measure Help from another person eating meals?: None Help from another person taking care of personal grooming?: A Little Help from another person toileting, which includes using toliet, bedpan, or urinal?: A Little Help from another person bathing (including washing, rinsing, drying)?: A Little Help from another person to put on and taking off regular upper body clothing?: A Little Help from another person to put on and taking off regular lower body clothing?: A Little 6 Click Score: 19   End of Session Equipment Utilized During Treatment: Gait belt Nurse  Communication: Mobility status  Activity Tolerance: Patient tolerated treatment well Patient left: in bed;with call bell/phone within reach;with bed alarm set;with family/visitor present  OT Visit Diagnosis: Unsteadiness on feet (R26.81);Muscle weakness (generalized) (M62.81)                Time: 1610-9604 OT Time Calculation (min): 19 min Charges:  OT General Charges $OT Visit: 1 Visit OT Evaluation $OT Eval Low Complexity: 1 Low  Christine Johns, OTR/L Acute Rehabilitation Services Office [phone] Secure Chat Communication Preferred   Donia Pounds 05/02/2023, 2:39 PM

## 2023-05-02 NOTE — Progress Notes (Signed)
Patient had Afib with RVR with HR 143 at 0421; it was non sustaining. Patient's current HR is 94 BP stable. Patient was agitated and confused but was able to calm down with verbal de escalation. Warm blanket, drink, and toileting was offered to patient and patient stated that " I will go to bed now".  Benjamine Sprague, MD was made aware.  Bed alarm initiated. Daughter at bedside.  Marty Sadlowski

## 2023-05-02 NOTE — Evaluation (Signed)
Physical Therapy Evaluation Patient Details Name: Christine Johns MRN: [id number] DOB: 12/07/1940 Today's Date: 05/02/2023  History of Present Illness  Christine Johns is a 82 yo female who presented after a syncopal episode at home. Found to have UTI. PMHx: paroxysmal atrial fibrillation, CKD, presyncope, hypertension, hyperlipidemia, dementia  Clinical Impression  Pt is presenting close to baseline level of functioning. No syncope or dizziness with activity. Pt was supervision to CGA for all functional activities. Pt lives at home with spouse who is hemiplegic. Daughter states she is unaware of any falls in the past 6 months. Due to pt current functional level, home set up and available assistance at home no recommended skilled physical therapy services at this time. Pt will be discharge from acute care skilled physical therapy services due to pt appears to be at baseline level of functioning. Pt tolerated session well.       If plan is discharge home, recommend the following: Assistance with cooking/housework;Assist for transportation;Help with stairs or ramp for entrance     Equipment Recommendations None recommended by PT     Functional Status Assessment Patient has not had a recent decline in their functional status     Precautions / Restrictions Precautions Precautions: Fall Restrictions Weight Bearing Restrictions: No      Mobility  Bed Mobility Overal bed mobility: Modified Independent       General bed mobility comments: HOB elevated    Transfers Overall transfer level: Needs assistance Equipment used: None Transfers: Sit to/from Stand Sit to Stand: Supervision    Ambulation/Gait Ambulation/Gait assistance: Supervision Gait Distance (Feet): 350 Feet Assistive device: None, 1 person hand held assist Gait Pattern/deviations: Step-through pattern, Decreased step length - right, Decreased step length - left Gait velocity: decreased Gait velocity interpretation: 1.31  - 2.62 ft/sec, indicative of limited community ambulator   General Gait Details: uneven step pattern, frontal plane movement. daughter reports pt has painful hammer toes but pt reports no pain when asked. HHA for safety intermittently and to gudie patient.  Stairs Stairs: Yes Stairs assistance: Contact guard assist Stair Management: One rail Left Number of Stairs: 2 General stair comments: pt has no steps at home. Spouse assists pt at home but hemiplegic.      Balance Overall balance assessment: Mild deficits observed, not formally tested           Pertinent Vitals/Pain Pain Assessment Pain Assessment: No/denies pain    Home Living Family/patient expects to be discharged to:: Private residence Living Arrangements: Spouse/significant other Available Help at Discharge: Family;Available 24 hours/day Type of Home: House Home Access: Stairs to enter   [employer] of Steps: 2   Home Layout: One level Home Equipment: Shower seat Additional Comments: dtr says pt and her husband "help each other." Husband drives    Prior Function Prior Level of Function : Independent/Modified Independent     Mobility Comments: no AD, denies falls (dtr confirms ) ADLs Comments: mod I for BADLs, husband drives and family assists with IADLs     Extremity/Trunk Assessment   Upper Extremity Assessment Upper Extremity Assessment: Defer to OT evaluation    Lower Extremity Assessment Lower Extremity Assessment: Overall WFL for tasks assessed    Cervical / Trunk Assessment Cervical / Trunk Assessment: Normal  Communication   Communication Communication: No apparent difficulties  Cognition Arousal: Alert Behavior During Therapy: WFL for tasks assessed/performed Overall Cognitive Status: History of cognitive impairments - at baseline       General Comments: hx of dementia;  pleasantly confused. Follows all commands.        General Comments General comments (skin integrity,  edema, etc.): Daughter present throughout session. No dizziness or syncope with activity.        Assessment/Plan    PT Assessment Patient does not need any further PT services         PT Goals (Current goals can be found in the Care Plan section)  Acute Rehab PT Goals PT Goal Formulation: All assessment and education complete, DC therapy        AM-PAC PT "6 Clicks" Mobility  Outcome Measure Help needed turning from your back to your side while in a flat bed without using bedrails?: None Help needed moving from lying on your back to sitting on the side of a flat bed without using bedrails?: None Help needed moving to and from a bed to a chair (including a wheelchair)?: A Little Help needed standing up from a chair using your arms (e.g., wheelchair or bedside chair)?: A Little Help needed to walk in hospital room?: A Little Help needed climbing 3-5 steps with a railing? : A Little 6 Click Score: 20    End of Session Equipment Utilized During Treatment: Gait belt Activity Tolerance: Patient tolerated treatment well Patient left: in bed;with call bell/phone within reach;with family/visitor present;with bed alarm set;with nursing/sitter in room Nurse Communication: Mobility status      Time: 1610-9604 PT Time Calculation (min) (ACUTE ONLY): 17 min   Charges:   PT Evaluation $PT Eval Low Complexity: 1 Low   PT General Charges $$ ACUTE PT VISIT: 1 Visit       Harrel Carina, DPT, CLT  Acute Rehabilitation Services Office: [phone] (Secure chat preferred)   Claudia Desanctis 05/02/2023, 3:35 PM

## 2023-05-02 NOTE — Progress Notes (Addendum)
Transition of Care [facility]) - Inpatient Brief Assessment   Patient Details  Name: Christine Johns MRN: [id number] Date of Birth: 31-Jul-1940  [facility]) CM/SW Contact:    Janae Bridgeman, RN Phone Number: 05/02/2023, 12:07 PM   Clinical Narrative: CM met with the patient at the home and daughter is at the bedside.  Patient admitted for Syncopal episode and patient's daughter was provided with Medicare Observation notice.  Patient is currently pending PT/OT evaluation.  Patient's daughter requested UR RN review the patient for inpatient and UR RN states that they will review patient's case at 1 pm today and will determine.  OT completed evaluation and PT plans to evaluate today.  No DME is needed.  HH RN, PT will be ordered for the patient for home health services and PT for home safety evaluation.  Daughter was provided with Medicare choice regarding home health and daughter chose [facility].  Kandee Keen, RNCM with [facility] accepted for home health services.  Orders placed to be co-signed by the MD.   Transition of Care Asessment: Insurance and Status: (P) Insurance coverage has been reviewed Patient has primary care physician: (P) Yes Home environment has been reviewed: (P) from home with spouse Prior level of function:: (P) Lives with spouse Prior/Current Home Services: (P) No current home services Social Determinants of Health Reivew: (P) SDOH reviewed needs interventions Readmission risk has been reviewed: (P) Yes Transition of care needs: (P) transition of care needs identified, TOC will continue to follow

## 2023-05-02 NOTE — Progress Notes (Signed)
Arrived to patient room. No infusions, tests, or procedures ordered at this time requiring pIV placement. Discussed with nurse regarding patient care and need for PIV placement. RN VU. Tomasita Morrow, RN VAST

## 2023-05-03 LAB — URINE CULTURE: Culture: >=100000 — AB

## 2023-05-04 ENCOUNTER — Other Ambulatory Visit: Payer: Self-pay | Admitting: Student

## 2023-05-04 DIAGNOSIS — N3000 Acute cystitis without hematuria: Secondary | ICD-10-CM

## 2023-05-04 MED ORDER — CEPHALEXIN 500 MG PO CAPS: 500.0000 mg | ORAL_CAPSULE | Freq: Two times a day (BID) | ORAL | 0 refills | Status: AC

## 2023-05-04 NOTE — Progress Notes (Signed)
Urine culture from admission in 04/2023 resulted with strep agalactiae.  Due to the patient's penicillin allergy we will start her on cephalexin 500 mg twice daily for 5 days and she will follow-up with her primary care provider for repeat urinalysis and urine culture if needed.  Discussed plan with patient's daughter and their preferred pharmacy is open today.  Rocky Morel, DO Internal Medicine Resident, PGY-2 Pager# [phone] Please contact the on call pager after 5 pm and on weekends at [phone]. 9:05 AM 05/04/2023

## 2023-05-08 ENCOUNTER — Ambulatory Visit: Payer: Medicare PPO | Attending: Cardiology

## 2023-05-08 DIAGNOSIS — R55 Syncope and collapse: Secondary | ICD-10-CM

## 2023-05-08 NOTE — Progress Notes (Unsigned)
Enrolled for [employer] to mail a ZIO XT long term holter monitor to the patients address on file.  ? ?Dr. Jonathan Branch to read. ?

## 2023-06-02 ENCOUNTER — Telehealth: Payer: Self-pay | Admitting: Medical

## 2023-06-02 ENCOUNTER — Ambulatory Visit: Payer: Medicare PPO | Admitting: Medical

## 2023-06-02 NOTE — Telephone Encounter (Signed)
Spoke with pt and  pts daughter to reschedule appointment today and she said that she never received a Zio and is confused. Said she only speaks with Dr. Burnadette Pop. Pt saw Yvonna Alanis, PA from [address] in hospital, please advise on follow up

## 2023-06-02 NOTE — Progress Notes (Deleted)
Cardiology Office Note:    Date:  06/02/2023   ID:  Christine Johns, DOB 08/10/1940, MRN [id number]  PCP:  Christine Ivan, MD  [facility] Cardiologist:  None  [facility] Electrophysiologist:  None   Referring MD: Christine Ivan, MD   Chief Complaint: ***  History of Present Illness:    Christine Johns is a 82 y.o. female with a hx of HTN, HLD, CKD IV, dementia, and remote history of Afib RVR in 2021 not on anticoagulation who is being seen for heart monitor follow-up.   The patient was admitted to [facility] for syncope with post-syncope confusion.  Workup showed low concern for ACS, seizure, PE, stroke, sepsis.  EKG without ischemia, troponins were low, EEG negative, no changes on head CT, no leukocytosis no fever no UTI symptoms.  Cardiac telemetry showed normal sinus rhythm with frequent PACs and some PVCs.  Heart monitor was ordered on discharge.  Past Medical History:  Diagnosis Date   Dementia (HCC)    Hypertension     No past surgical history on file.  Current Medications: No outpatient medications have been marked as taking for the 06/02/23 encounter (Appointment) with Fransico Michael, Amita Atayde H, PA-C.     Allergies:   Atorvastatin, Penicillins, and Sulfa antibiotics   Social History   Socioeconomic History   Marital status: Married    Spouse name: Not on file   Number of children: Not on file   Years of education: Not on file   Highest education level: Not on file  Occupational History   Not on file  Tobacco Use   Smoking status: Never   Smokeless tobacco: Never  Substance and Sexual Activity   Alcohol use: Never   Drug use: Never   Sexual activity: Not on file  Other Topics Concern   Not on file  Social History Narrative   Not on file   Social Drivers of Health   Financial Resource Strain: Low Risk  (04/29/2023)   Received from [facility]   Overall Financial Resource Strain (CARDIA)    Difficulty of Paying Living Expenses:  Not hard at all  Food Insecurity: No Food Insecurity (05/01/2023)   Hunger Vital Sign    Worried About Running Out of Food in the Last Year: Never true    Ran Out of Food in the Last Year: Never true  Transportation Needs: No Transportation Needs (05/01/2023)   PRAPARE - Administrator, Civil Service (Medical): No    Lack of Transportation (Non-Medical): No  Physical Activity: Not on file  Stress: Not on file  Social Connections: Not on file     Family History: The patient's ***family history is negative for Breast cancer.  ROS:   Please see the history of present illness.    *** All other systems reviewed and are negative.  EKGs/Labs/Other Studies Reviewed:    The following studies were reviewed today: ***  EKG:  EKG is *** ordered today.  The ekg ordered today demonstrates ***  Recent Labs: 05/01/2023: ALT 13 05/02/2023: BUN 20; Creatinine, Ser 1.70; Hemoglobin 9.9; Magnesium 2.6; Platelets 139; Potassium 3.1; Sodium 135  Recent Lipid Panel    Component Value Date/Time   CHOL 125 03/28/2020 0514   TRIG 93 03/28/2020 0514   HDL 33 (L) 03/28/2020 0514   CHOLHDL 3.8 03/28/2020 0514   VLDL 19 03/28/2020 0514   LDLCALC 73 03/28/2020 0514     Risk Assessment/Calculations:   {Does this patient have ATRIAL  FIBRILLATION?:[phone]}   Physical Exam:    VS:  There were no vitals taken for this visit.    Wt Readings from Last 3 Encounters:  05/02/23 113 lb 1.5 oz (51.3 kg)  03/29/20 131 lb 13.4 oz (59.8 kg)     GEN: *** Well nourished, well developed in no acute distress HEENT: Normal NECK: No JVD; No carotid bruits LYMPHATICS: No lymphadenopathy CARDIAC: ***RRR, no murmurs, rubs, gallops RESPIRATORY:  Clear to auscultation without rales, wheezing or rhonchi  ABDOMEN: Soft, non-tender, non-distended MUSCULOSKELETAL:  No edema; No deformity  SKIN: Warm and dry NEUROLOGIC:  Alert and oriented x 3 PSYCHIATRIC:  Normal affect   ASSESSMENT:    No  diagnosis found. PLAN:    In order of problems listed above:  ***  Disposition: Follow up {follow up:15908} with ***   Shared Decision Making/Informed Consent   {Are you ordering a CV Procedure (e.g. stress test, cath, DCCV, TEE, etc)?   Press F2        :[id number]}    Signed, Harjit Douds David Stall, PA-C  06/02/2023 10:12 AM    [facility]

## 2023-06-03 ENCOUNTER — Ambulatory Visit: Payer: Medicare PPO | Admitting: Nurse Practitioner

## 2023-06-03 NOTE — Telephone Encounter (Signed)
 Spoke with patient in order to schedule appointment after her heart monitor results. She reports that nobody ever mentioned a heart monitor and they have not received one. Advised that I will put in another order for one to be shipped to them but she wants to wait. She prefers to hear from her PCP before having a monitor mailed and placed. Will route message to Thom Sluder PA-C and PCP Dr. Alla for further recommendations.       Sluder Thom LITTIE DEVONNA  Physician Assistant Certified Cardiology   Progress Notes    Signed   Date of Service: 05/02/2023  3:30 PM   Signed      Addendum plan: Per teaching services, will discharge on 2-week monitor and will have her follow-up an clinic.  I will schedule follow-up roughly 1 month from today to ensure follow-up provider has results.        Electronically signed by Sluder Thom LITTIE, PA-C at 05/02/2023  3:31 PM

## 2023-06-10 ENCOUNTER — Other Ambulatory Visit (HOSPITAL_COMMUNITY): Payer: Self-pay

## 2023-07-12 ENCOUNTER — Other Ambulatory Visit: Payer: Self-pay | Admitting: Student

## 2023-07-14 NOTE — Telephone Encounter (Signed)
 NOT [facility] PATIENT

## 2023-07-30 ENCOUNTER — Other Ambulatory Visit (HOSPITAL_COMMUNITY): Payer: Self-pay

## 2023-09-07 ENCOUNTER — Other Ambulatory Visit: Payer: Self-pay

## 2023-09-07 ENCOUNTER — Emergency Department

## 2023-09-07 ENCOUNTER — Inpatient Hospital Stay
Admission: EM | Admit: 2023-09-07 | Discharge: 2023-09-16 | DRG: 193 | Disposition: A | Discharge status: Home Health | Attending: Student | Admitting: Student

## 2023-09-07 DIAGNOSIS — Z862 Personal history of diseases of the blood and blood-forming organs and certain disorders involving the immune mechanism: Secondary | ICD-10-CM

## 2023-09-07 DIAGNOSIS — R7989 Other specified abnormal findings of blood chemistry: Secondary | ICD-10-CM | POA: Insufficient documentation

## 2023-09-07 DIAGNOSIS — E162 Hypoglycemia, unspecified: Secondary | ICD-10-CM | POA: Diagnosis not present

## 2023-09-07 DIAGNOSIS — J90 Pleural effusion, not elsewhere classified: Secondary | ICD-10-CM

## 2023-09-07 DIAGNOSIS — I4719 Other supraventricular tachycardia: Secondary | ICD-10-CM | POA: Diagnosis present

## 2023-09-07 DIAGNOSIS — R64 Cachexia: Secondary | ICD-10-CM | POA: Diagnosis present

## 2023-09-07 DIAGNOSIS — I493 Ventricular premature depolarization: Secondary | ICD-10-CM | POA: Diagnosis present

## 2023-09-07 DIAGNOSIS — I429 Cardiomyopathy, unspecified: Secondary | ICD-10-CM | POA: Diagnosis present

## 2023-09-07 DIAGNOSIS — I480 Paroxysmal atrial fibrillation: Secondary | ICD-10-CM | POA: Diagnosis present

## 2023-09-07 DIAGNOSIS — N1832 Chronic kidney disease, stage 3b: Secondary | ICD-10-CM | POA: Diagnosis present

## 2023-09-07 DIAGNOSIS — Z79899 Other long term (current) drug therapy: Secondary | ICD-10-CM

## 2023-09-07 DIAGNOSIS — Z6821 Body mass index (BMI) 21.0-21.9, adult: Secondary | ICD-10-CM

## 2023-09-07 DIAGNOSIS — I5043 Acute on chronic combined systolic (congestive) and diastolic (congestive) heart failure: Secondary | ICD-10-CM | POA: Diagnosis present

## 2023-09-07 DIAGNOSIS — I4510 Unspecified right bundle-branch block: Secondary | ICD-10-CM | POA: Diagnosis present

## 2023-09-07 DIAGNOSIS — Z7901 Long term (current) use of anticoagulants: Secondary | ICD-10-CM

## 2023-09-07 DIAGNOSIS — I4891 Unspecified atrial fibrillation: Secondary | ICD-10-CM | POA: Diagnosis not present

## 2023-09-07 DIAGNOSIS — R918 Other nonspecific abnormal finding of lung field: Secondary | ICD-10-CM | POA: Diagnosis present

## 2023-09-07 DIAGNOSIS — Z888 Allergy status to other drugs, medicaments and biological substances status: Secondary | ICD-10-CM

## 2023-09-07 DIAGNOSIS — J9601 Acute respiratory failure with hypoxia: Secondary | ICD-10-CM | POA: Diagnosis present

## 2023-09-07 DIAGNOSIS — I10 Essential (primary) hypertension: Secondary | ICD-10-CM | POA: Diagnosis present

## 2023-09-07 DIAGNOSIS — D631 Anemia in chronic kidney disease: Secondary | ICD-10-CM | POA: Diagnosis present

## 2023-09-07 DIAGNOSIS — E876 Hypokalemia: Secondary | ICD-10-CM | POA: Diagnosis present

## 2023-09-07 DIAGNOSIS — Z882 Allergy status to sulfonamides status: Secondary | ICD-10-CM

## 2023-09-07 DIAGNOSIS — I130 Hypertensive heart and chronic kidney disease with heart failure and stage 1 through stage 4 chronic kidney disease, or unspecified chronic kidney disease: Secondary | ICD-10-CM | POA: Diagnosis present

## 2023-09-07 DIAGNOSIS — E860 Dehydration: Secondary | ICD-10-CM | POA: Diagnosis present

## 2023-09-07 DIAGNOSIS — E872 Acidosis, unspecified: Secondary | ICD-10-CM | POA: Diagnosis present

## 2023-09-07 DIAGNOSIS — I2489 Other forms of acute ischemic heart disease: Secondary | ICD-10-CM | POA: Diagnosis present

## 2023-09-07 DIAGNOSIS — J123 Human metapneumovirus pneumonia: Secondary | ICD-10-CM | POA: Diagnosis present

## 2023-09-07 DIAGNOSIS — J189 Pneumonia, unspecified organism: Secondary | ICD-10-CM | POA: Diagnosis not present

## 2023-09-07 DIAGNOSIS — E782 Mixed hyperlipidemia: Secondary | ICD-10-CM | POA: Diagnosis present

## 2023-09-07 DIAGNOSIS — Z9889 Other specified postprocedural states: Secondary | ICD-10-CM

## 2023-09-07 DIAGNOSIS — F0390 Unspecified dementia without behavioral disturbance: Secondary | ICD-10-CM | POA: Diagnosis present

## 2023-09-07 DIAGNOSIS — I5022 Chronic systolic (congestive) heart failure: Secondary | ICD-10-CM | POA: Diagnosis not present

## 2023-09-07 DIAGNOSIS — A084 Viral intestinal infection, unspecified: Secondary | ICD-10-CM | POA: Diagnosis present

## 2023-09-07 DIAGNOSIS — Z880 Allergy status to penicillin: Secondary | ICD-10-CM

## 2023-09-07 DIAGNOSIS — J918 Pleural effusion in other conditions classified elsewhere: Secondary | ICD-10-CM | POA: Diagnosis present

## 2023-09-07 DIAGNOSIS — Z1152 Encounter for screening for COVID-19: Secondary | ICD-10-CM | POA: Diagnosis not present

## 2023-09-07 DIAGNOSIS — E871 Hypo-osmolality and hyponatremia: Secondary | ICD-10-CM | POA: Diagnosis present

## 2023-09-07 DIAGNOSIS — Z515 Encounter for palliative care: Secondary | ICD-10-CM

## 2023-09-07 DIAGNOSIS — J441 Chronic obstructive pulmonary disease with (acute) exacerbation: Secondary | ICD-10-CM | POA: Diagnosis present

## 2023-09-07 DIAGNOSIS — Z87440 Personal history of urinary (tract) infections: Secondary | ICD-10-CM

## 2023-09-07 DIAGNOSIS — R4189 Other symptoms and signs involving cognitive functions and awareness: Secondary | ICD-10-CM | POA: Diagnosis present

## 2023-09-07 DIAGNOSIS — E440 Moderate protein-calorie malnutrition: Secondary | ICD-10-CM | POA: Diagnosis present

## 2023-09-07 HISTORY — DX: Paroxysmal atrial fibrillation: I48.0

## 2023-09-07 HISTORY — DX: Hyperlipidemia, unspecified: E78.5

## 2023-09-07 HISTORY — DX: Chronic kidney disease, unspecified: N18.9

## 2023-09-07 LAB — CBC WITH DIFFERENTIAL/PLATELET
Abs Immature Granulocytes: 0.01 10*3/uL (ref 0.00–0.07)
Basophils Absolute: 0 10*3/uL (ref 0.0–0.1)
Basophils Relative: 0 %
Eosinophils Absolute: 0 10*3/uL (ref 0.0–0.5)
Eosinophils Relative: 0 %
HCT: 34.1 % — ABNORMAL LOW (ref 36.0–46.0)
Hemoglobin: 11.1 g/dL — ABNORMAL LOW (ref 12.0–15.0)
Immature Granulocytes: 0 %
Lymphocytes Relative: 18 %
Lymphs Abs: 0.8 10*3/uL (ref 0.7–4.0)
MCH: 30.5 pg (ref 26.0–34.0)
MCHC: 32.6 g/dL (ref 30.0–36.0)
MCV: 93.7 fL (ref 80.0–100.0)
Monocytes Absolute: 0.4 10*3/uL (ref 0.1–1.0)
Monocytes Relative: 9 %
Neutro Abs: 3.3 10*3/uL (ref 1.7–7.7)
Neutrophils Relative %: 73 %
Platelets: 127 10*3/uL — ABNORMAL LOW (ref 150–400)
RBC: 3.64 MIL/uL — ABNORMAL LOW (ref 3.87–5.11)
RDW: 14 % (ref 11.5–15.5)
WBC: 4.5 10*3/uL (ref 4.0–10.5)
nRBC: 0 % (ref 0.0–0.2)

## 2023-09-07 LAB — COMPREHENSIVE METABOLIC PANEL WITH GFR
ALT: 12 U/L (ref 0–44)
AST: 27 U/L (ref 15–41)
Albumin: 3.1 g/dL — ABNORMAL LOW (ref 3.5–5.0)
Alkaline Phosphatase: 42 U/L (ref 38–126)
Anion gap: 12 (ref 5–15)
BUN: 17 mg/dL (ref 8–23)
CO2: 18 mmol/L — ABNORMAL LOW (ref 22–32)
Calcium: 8 mg/dL — ABNORMAL LOW (ref 8.9–10.3)
Chloride: 107 mmol/L (ref 98–111)
Creatinine, Ser: 1.23 mg/dL — ABNORMAL HIGH (ref 0.44–1.00)
GFR, Estimated: 43 mL/min — ABNORMAL LOW (ref >60)
Glucose, Bld: 109 mg/dL — ABNORMAL HIGH (ref 70–99)
Potassium: 2.8 mmol/L — ABNORMAL LOW (ref 3.5–5.1)
Sodium: 137 mmol/L (ref 135–145)
Total Bilirubin: 0.8 mg/dL (ref 0.0–1.2)
Total Protein: 6.4 g/dL — ABNORMAL LOW (ref 6.5–8.1)

## 2023-09-07 LAB — RESP PANEL BY RT-PCR (RSV, FLU A&B, COVID)  RVPGX2
Influenza A by PCR: NEGATIVE
Influenza B by PCR: NEGATIVE
Resp Syncytial Virus by PCR: NEGATIVE
SARS Coronavirus 2 by RT PCR: NEGATIVE

## 2023-09-07 LAB — URINALYSIS, W/ REFLEX TO CULTURE (INFECTION SUSPECTED)
Bacteria, UA: NONE SEEN
Bilirubin Urine: NEGATIVE
Glucose, UA: NEGATIVE mg/dL
Ketones, ur: 5 mg/dL — AB
Leukocytes,Ua: NEGATIVE
Nitrite: NEGATIVE
Protein, ur: 30 mg/dL — AB
Specific Gravity, Urine: 1.014 (ref 1.005–1.030)
Squamous Epithelial / HPF: 0 /HPF (ref 0–5)
WBC, UA: 0 WBC/hpf (ref 0–5)
pH: 5 (ref 5.0–8.0)

## 2023-09-07 LAB — PROTIME-INR
INR: 1.2 (ref 0.8–1.2)
Prothrombin Time: 15.1 s (ref 11.4–15.2)

## 2023-09-07 LAB — T4, FREE: Free T4: 0.85 ng/dL (ref 0.61–1.12)

## 2023-09-07 LAB — TROPONIN I (HIGH SENSITIVITY): Troponin I (High Sensitivity): 65 ng/L — ABNORMAL HIGH (ref <18)

## 2023-09-07 LAB — TSH: TSH: 0.726 u[IU]/mL (ref 0.350–4.500)

## 2023-09-07 LAB — MAGNESIUM: Magnesium: 1.2 mg/dL — ABNORMAL LOW (ref 1.7–2.4)

## 2023-09-07 LAB — LACTIC ACID, PLASMA: Lactic Acid, Venous: 1.6 mmol/L (ref 0.5–1.9)

## 2023-09-07 MED ORDER — LISINOPRIL 5 MG PO TABS
10.0000 mg | ORAL_TABLET | Freq: Every day | ORAL | Status: DC
  Administered 2023-09-08: 10 mg via ORAL
  Filled 2023-09-07: qty 1

## 2023-09-07 MED ORDER — SENNOSIDES-DOCUSATE SODIUM 8.6-50 MG PO TABS: 1.0000 | ORAL_TABLET | Freq: Every evening | ORAL | Status: DC | PRN

## 2023-09-07 MED ORDER — ONDANSETRON HCL 4 MG PO TABS: 4.0000 mg | ORAL_TABLET | Freq: Four times a day (QID) | ORAL | Status: AC | PRN

## 2023-09-07 MED ORDER — DILTIAZEM HCL-DEXTROSE 125-5 MG/125ML-% IV SOLN (PREMIX)
5.0000 mg/h | INTRAVENOUS | Status: DC
  Administered 2023-09-07: 5 mg/h via INTRAVENOUS
  Administered 2023-09-08 – 2023-09-09 (×2): 15 mg/h via INTRAVENOUS
  Filled 2023-09-07 (×7): qty 125

## 2023-09-07 MED ORDER — LACTATED RINGERS IV BOLUS
500.0000 mL | Freq: Once | INTRAVENOUS | Status: AC
  Administered 2023-09-07: 500 mL via INTRAVENOUS

## 2023-09-07 MED ORDER — MAGNESIUM SULFATE 2 GM/50ML IV SOLN
2.0000 g | Freq: Once | INTRAVENOUS | Status: AC
  Administered 2023-09-07: 2 g via INTRAVENOUS
  Filled 2023-09-07: qty 50

## 2023-09-07 MED ORDER — DILTIAZEM HCL 25 MG/5ML IV SOLN
10.0000 mg | Freq: Once | INTRAVENOUS | Status: AC
  Administered 2023-09-07: 10 mg via INTRAVENOUS
  Filled 2023-09-07: qty 5

## 2023-09-07 MED ORDER — POTASSIUM CHLORIDE 10 MEQ/100ML IV SOLN
10.0000 meq | INTRAVENOUS | Status: AC
Start: 2023-09-07 — End: 2023-09-07
  Administered 2023-09-07 (×2): 10 meq via INTRAVENOUS
  Filled 2023-09-07 (×2): qty 100

## 2023-09-07 MED ORDER — SODIUM CHLORIDE 0.9 % IV SOLN
2.0000 g | Freq: Once | INTRAVENOUS | Status: AC
  Administered 2023-09-07: 2 g via INTRAVENOUS
  Filled 2023-09-07: qty 20

## 2023-09-07 MED ORDER — DOXYCYCLINE HYCLATE 100 MG PO TABS: 100.0000 mg | ORAL_TABLET | Freq: Once | ORAL | Status: DC

## 2023-09-07 MED ORDER — ONDANSETRON HCL 4 MG/2ML IJ SOLN: 4.0000 mg | Freq: Four times a day (QID) | INTRAMUSCULAR | Status: AC | PRN

## 2023-09-07 MED ORDER — SODIUM CHLORIDE 0.9 % IV SOLN
2.0000 g | INTRAVENOUS | Status: AC
  Administered 2023-09-08 – 2023-09-11 (×4): 2 g via INTRAVENOUS
  Filled 2023-09-07 (×4): qty 20

## 2023-09-07 MED ORDER — ACETAMINOPHEN 325 MG PO TABS
650.0000 mg | ORAL_TABLET | Freq: Four times a day (QID) | ORAL | Status: AC | PRN
  Administered 2023-09-07: 650 mg via ORAL
  Filled 2023-09-07 (×2): qty 2

## 2023-09-07 MED ORDER — ACETAMINOPHEN 650 MG RE SUPP: 650.0000 mg | Freq: Four times a day (QID) | RECTAL | Status: AC | PRN

## 2023-09-07 MED ORDER — APIXABAN 2.5 MG PO TABS
2.5000 mg | ORAL_TABLET | Freq: Two times a day (BID) | ORAL | Status: DC
  Administered 2023-09-07 – 2023-09-16 (×18): 2.5 mg via ORAL
  Filled 2023-09-07 (×18): qty 1

## 2023-09-07 MED ORDER — OYSTER SHELL CALCIUM/D3 500-5 MG-MCG PO TABS
2.0000 | ORAL_TABLET | Freq: Every day | ORAL | Status: DC
  Administered 2023-09-08 – 2023-09-16 (×9): 2 via ORAL
  Filled 2023-09-07 (×9): qty 2

## 2023-09-07 MED ORDER — SODIUM CHLORIDE 0.9 % IV BOLUS
1000.0000 mL | Freq: Once | INTRAVENOUS | Status: AC
  Administered 2023-09-07: 1000 mL via INTRAVENOUS

## 2023-09-07 MED ORDER — SODIUM CHLORIDE 0.9 % IV SOLN
500.0000 mg | Freq: Every day | INTRAVENOUS | Status: AC
  Administered 2023-09-07 – 2023-09-11 (×5): 500 mg via INTRAVENOUS
  Filled 2023-09-07 (×5): qty 5

## 2023-09-07 MED ORDER — ADULT MULTIVITAMIN W/MINERALS CH
1.0000 | ORAL_TABLET | Freq: Every day | ORAL | Status: DC
Start: 2023-09-07 — End: 2023-09-16
  Administered 2023-09-07 – 2023-09-16 (×10): 1 via ORAL
  Filled 2023-09-07 (×10): qty 1

## 2023-09-07 MED ORDER — HALOPERIDOL LACTATE 5 MG/ML IJ SOLN
5.0000 mg | Freq: Four times a day (QID) | INTRAMUSCULAR | Status: AC | PRN
  Administered 2023-09-07 – 2023-09-09 (×2): 5 mg via INTRAVENOUS
  Filled 2023-09-07 (×2): qty 1

## 2023-09-07 MED ORDER — VITAMIN D 25 MCG (1000 UNIT) PO TABS
1000.0000 [IU] | ORAL_TABLET | Freq: Every day | ORAL | Status: DC
  Administered 2023-09-08 – 2023-09-16 (×9): 1000 [IU] via ORAL
  Filled 2023-09-07 (×9): qty 1

## 2023-09-07 MED ORDER — POTASSIUM CHLORIDE CRYS ER 20 MEQ PO TBCR
40.0000 meq | EXTENDED_RELEASE_TABLET | Freq: Once | ORAL | Status: AC
  Administered 2023-09-07: 40 meq via ORAL
  Filled 2023-09-07: qty 2

## 2023-09-07 MED ORDER — PRAVASTATIN SODIUM 40 MG PO TABS
40.0000 mg | ORAL_TABLET | Freq: Every day | ORAL | Status: DC
  Administered 2023-09-07 – 2023-09-15 (×8): 40 mg via ORAL
  Filled 2023-09-07 (×3): qty 1
  Filled 2023-09-07: qty 2
  Filled 2023-09-07: qty 1
  Filled 2023-09-07: qty 2
  Filled 2023-09-07 (×2): qty 1

## 2023-09-07 NOTE — Hospital Course (Signed)
 Ms. Christine Johns is an 83 year old female with history of atrial fibrillation on Eliquis, currently not on rate control, hypertension, dementia, hyperlipidemia, who presents emergency department for chief concerns of altered mental status.  Patient presented via EMS and per nursing documentation, patient received sodium chloride 500 mL liter bolus per EMS.  Vitals in the ED showed temperature 99, respiration rate 29, improved to 22, heart rate initially 50, currently 80, and on EKG showed atrial fibrillation with rate of 129, QTc 552.  Blood pressure is 159/98, SpO2 95% on room air.  Serum sodium is 135, potassium 2.8, chloride 107, bicarb 18, BUN of 17, serum creatinine 1.27, EGFR 43, nonfasting blood glucose 109, WBC 4.5, hemoglobin 11.2, platelets of 127.  High-sensitivity troponin 65.  Lactic acid 1.6.  Magnesium 1.2.  COVID/influenza A/influenza B/RSV PCR were negative.  Blood cultures x 2 are in process.  ED treatment: Doxycycline 100 mg p.o. one-time dose, diltiazem 10 mg x 2 doses, potassium chloride 40 mEq p.o. one-time dose, ceftriaxone 2 g IV one-time dose, magnesium 2 g IV one-time dose, sodium chloride 1 L bolus, diltiazem gtt. initiated.

## 2023-09-07 NOTE — Assessment & Plan Note (Addendum)
 Azithromycin 500 mg IV daily to complete a 5-day course, ceftriaxone 2 g IV daily to complete a 5-day course, sodium chloride 1 L bolus ordered by EDP LR 500 mL bolus on admission Incentive spirometry, flutter valve Oxygen supplementation as needed to maintain SpO2 greater than 92%

## 2023-09-07 NOTE — Assessment & Plan Note (Signed)
 At baseline

## 2023-09-07 NOTE — Assessment & Plan Note (Signed)
 Lovastatin equivalent, pravastatin 40 mg nightly ordered

## 2023-09-07 NOTE — Assessment & Plan Note (Signed)
 Status magnesium 2 g IV ordered by EDP Recheck magnesium level in the morning

## 2023-09-07 NOTE — ED Triage Notes (Signed)
 Coming from home. alterd mental status family states that patient has not been able to walk good for last few days. Patient has also been weak. Family suspects UTI. Patient has A-fib. Patient recieving NS from EMS. Patient takes Eliquis. Patient DOES NOT take a beta-blocker. Patietn has Hx of dementia.

## 2023-09-07 NOTE — Assessment & Plan Note (Signed)
 Suspect secondary to demand ischemia in setting of atrial fibrillation with RVR Continue to monitor surgery second high sensitive troponin

## 2023-09-07 NOTE — Assessment & Plan Note (Addendum)
 Status post potassium chloride 40 mEq p.o. one-time dose, potassium chloride 10 mill equivalent, 2 doses ordered by EDP LR 500 mL liter bolus on admission Recheck BMP in the a.m.

## 2023-09-07 NOTE — H&P (Signed)
 History and Physical   Christine Johns GNF:[id number] DOB: 08-27-1940 DOA: 09/07/2023  PCP: Marisue Ivan, MD  Patient coming from: home via EMS  I have personally briefly reviewed patient's old medical records in [facility] EMR.  Chief Concern: weakness  HPI: Christine Johns is an 83 year old female with history of atrial fibrillation on Eliquis, currently not on rate control, hypertension, dementia, hyperlipidemia, who presents emergency department for chief concerns of altered mental status.  Patient presented via EMS and per nursing documentation, patient received sodium chloride 500 mL liter bolus per EMS.  Vitals in the ED showed temperature 99, respiration rate 29, improved to 22, heart rate initially 50, currently 80, and on EKG showed atrial fibrillation with rate of 129, QTc 552.  Blood pressure is 159/98, SpO2 95% on room air.  Serum sodium is 135, potassium 2.8, chloride 107, bicarb 18, BUN of 17, serum creatinine 1.27, EGFR 43, nonfasting blood glucose 109, WBC 4.5, hemoglobin 11.2, platelets of 127.  High-sensitivity troponin 65.  Lactic acid 1.6.  Magnesium 1.2.  COVID/influenza A/influenza B/RSV PCR were negative.  Blood cultures x 2 are in process.  ED treatment: Doxycycline 100 mg p.o. one-time dose, diltiazem 10 mg x 2 doses, potassium chloride 40 mEq p.o. one-time dose, ceftriaxone 2 g IV one-time dose, magnesium 2 g IV one-time dose, sodium chloride 1 L bolus, diltiazem gtt. initiated. --------------------------------- At bedside, she was able to tell me her first and last name. She was not able to tell me her current location, current calendar year, current month, or the current president.   She does not know why she is in the hospital. She denies being weak.  She denies chest pain, shortness of breath, dysuria.  Social history: She lives at home with her husband. She denies history of smoking, drinking etoh, and recreational drug use. She is retired and formerly  was a Runner, broadcasting/film/video, Haematologist.   ROS: unable to accurately complete due to patient with what appears to be moderate - advanced dementia  ED Course: Discussed with EDP, patient requiring hospitalization for chief concerns of multilobar pneumonia.  Assessment/Plan  Principal Problem:   Multilobar lung infiltrate Active Problems:   Atrial fibrillation with rapid ventricular response (HCC)   Acute hypokalemia   Hypomagnesemia   Cognitive impairment   Essential hypertension   History of normocytic normochromic anemia   Mixed hyperlipidemia   Stage 3b chronic kidney disease (HCC)   Elevated troponin   Assessment and Plan:  * Multilobar lung infiltrate Azithromycin 500 mg IV daily to complete a 5-day course, ceftriaxone 2 g IV daily to complete a 5-day course, sodium chloride 1 L bolus ordered by EDP LR 500 mL bolus on admission Incentive spirometry, flutter valve Oxygen supplementation as needed to maintain SpO2 greater than 92%  Elevated troponin Suspect secondary to demand ischemia in setting of atrial fibrillation with RVR Continue to monitor surgery second high sensitive troponin  Stage 3b chronic kidney disease (HCC) At baseline  Mixed hyperlipidemia Lovastatin equivalent, pravastatin 40 mg nightly ordered  History of normocytic normochromic anemia At baseline  Essential hypertension Home lisinopril 10 mg daily resumed  Hypomagnesemia Status magnesium 2 g IV ordered by EDP Recheck magnesium level in the morning  Acute hypokalemia Status post potassium chloride 40 mEq p.o. one-time dose, potassium chloride 10 mill equivalent, 2 doses ordered by EDP LR 500 mL liter bolus on admission Recheck BMP in the a.m.  Atrial fibrillation with rapid ventricular response (HCC) Home Eliquis resumed on  admission Patient currently not on a rate control medication Continue diltiazem GGT  Chart reviewed.   DVT prophylaxis: Eliquis 2.5 mg p.o. twice daily Code Status:  Full code Diet: Heart healthy Family Communication: Updated brother-in-law Darius at bedside Disposition Plan: Pending clinical course Consults called: Pharmacy Admission status: telemetry cardiac inpatient   Past Medical History:  Diagnosis Date   Dementia (HCC)    Hypertension    History reviewed. No pertinent surgical history.  Social History:  reports that she has never smoked. She has never used smokeless tobacco. She reports that she does not drink alcohol and does not use drugs.  Allergies  Allergen Reactions   Atorvastatin Other (See Comments)   Penicillins Itching and Swelling   Sulfa Antibiotics Itching and Swelling   Family History  Problem Relation Age of Onset   Macular degeneration Brother    Breast cancer Neg Hx    Family history: Family history reviewed and not pertinent.  Prior to Admission medications   Medication Sig Start Date End Date Taking? Authorizing Provider  Calcium Carb-Cholecalciferol 600-10 MG-MCG TABS Take 2 tablets by mouth daily. 08/29/23  Yes [provider]  apixaban (ELIQUIS) 2.5 MG TABS tablet Take 1 tablet (2.5 mg total) by mouth 2 (two) times daily. 05/02/23   Rocky Morel, DO  Cholecalciferol (VITAMIN D) 125 MCG (5000 UT) CAPS Take 1 capsule by mouth daily.    [provider]  lisinopril (ZESTRIL) 10 MG tablet Take 10 mg by mouth daily.    [provider]  lovastatin (MEVACOR) 40 MG tablet Take 40 mg by mouth daily.    [provider]  Multiple Vitamin (MULTI-VITAMIN) tablet Take 1 tablet by mouth daily.    [provider]  Potassium 99 MG TABS Take 1 tablet by mouth daily.    [provider]   Physical Exam: Vitals:   09/07/23 1046 09/07/23 1049 09/07/23 1138 09/07/23 1210  BP: (!) 159/98  (!) 159/98 (!) 147/78  Pulse:  (!) 50 (!) 107 (!) 49  Resp:  (!) 29 (!) 25 (!) 26  Temp:   99 F (37.2 C)   TempSrc:   Oral   SpO2:  95% 95% 96%   Constitutional: appears frail,  age-appropriate, cachectic, weak Eyes: PERRL, lids and conjunctivae normal ENMT: Mucous membranes are moist. Posterior pharynx clear of any exudate or lesions. Age-appropriate dentition. Hearing appropriate Neck: normal, supple, no masses, no thyromegaly Respiratory: clear to auscultation bilaterally, no wheezing, no crackles.  Shallow respiratory effort. No accessory muscle use.  Cardiovascular: Regular rate and rhythm, no murmurs / rubs / gallops. No extremity edema. 2+ pedal pulses. No carotid bruits.  Abdomen: no tenderness, no masses palpated, no hepatosplenomegaly. Bowel sounds positive.  Musculoskeletal: no clubbing / cyanosis. No joint deformity upper and lower extremities. Good ROM, no contractures, no atrophy. Normal muscle tone.  Skin: no rashes, lesions, ulcers. No induration Neurologic: Sensation intact. Strength 5/5 in all 4.  Psychiatric: Normal judgment and insight. Alert and oriented x 3. Normal mood.   EKG: independently reviewed, showing atrial fibrillation with rate of 129, QTc 452  Chest x-ray on Admission: I personally reviewed and I agree with radiologist reading as below.  CT Cervical Spine Wo Contrast Result Date: 09/07/2023 CLINICAL DATA:  Neck trauma.  Altered mental status.  Abnormal gait. EXAM: CT CERVICAL SPINE WITHOUT CONTRAST TECHNIQUE: Multidetector CT imaging of the cervical spine was performed without intravenous contrast. Multiplanar CT image reconstructions were also generated. RADIATION DOSE REDUCTION: This exam was  performed according to the departmental dose-optimization program which includes automated exposure control, adjustment of the mA and/or kV according to patient size and/or use of iterative reconstruction technique. COMPARISON:  None Available. FINDINGS: Alignment: Slight degenerative anterolisthesis is present at C3-4 and C4-5. Reversal of the normal cervical lordosis is present. Skull base and vertebrae: The craniocervical junction is normal. The  vertebral body heights are normal. No acute fractures are present. Prominent pannus formation is present at C1-2. Soft tissues and spinal canal: No prevertebral fluid or swelling. No visible canal hematoma. Disc levels: Uncovertebral spurring is worse left than right at C5-6 and C6-7 with left foraminal stenosis at both of these levels Upper chest: A left pleural effusion is present. Mild dependent atelectasis is present bilaterally. IMPRESSION: 1. No acute fracture or traumatic subluxation. 2. Degenerative changes of the cervical spine as described. 3. Left pleural effusion. Electronically Signed   By: Marin Roberts M.D.   On: 09/07/2023 13:18   CT HEAD WO CONTRAST ( ) Result Date: 09/07/2023 CLINICAL DATA:  Head trauma, minor. Altered mental status. Weakness. Possible urinary tract infection. EXAM: CT HEAD WITHOUT CONTRAST TECHNIQUE: Contiguous axial images were obtained from the base of the skull through the vertex without intravenous contrast. RADIATION DOSE REDUCTION: This exam was performed according to the departmental dose-optimization program which includes automated exposure control, adjustment of the mA and/or kV according to patient size and/or use of iterative reconstruction technique. COMPARISON:  CT head without contrast 05/02/2023. FINDINGS: Brain: Advanced atrophy and white matter disease is similar the prior study. No acute or focal cortical abnormality is present. No acute hemorrhage or mass lesion is present. A remote lacunar infarct of the left basal ganglia is stable. Deep gray nuclei are otherwise within normal limits. The brainstem and cerebellum are within normal limits. Midline structures are within normal limits. Vascular: Atherosclerotic calcifications are present within the cavernous internal carotid arteries bilaterally. No hyperdense vessel is present. Skull: Calvarium is intact. No focal lytic or blastic lesions are present. No significant extracranial soft tissue lesion is  present. Sinuses/Orbits: The paranasal sinuses and mastoid air cells are clear. Bilateral lens replacements are noted. Globes and orbits are otherwise unremarkable. IMPRESSION: 1. No acute intracranial abnormality or significant interval change. 2. Advanced atrophy and white matter disease is similar the prior study and likely reflects the sequela of chronic microvascular ischemia. 3. 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 Electronically Signed   By: Marin Roberts M.D.   On: 09/07/2023 13:15   DG Chest Port 1 View Result Date: 09/07/2023 CLINICAL DATA:  Altered mental status, weakness.  Suspected sepsis. EXAM: PORTABLE CHEST 1 VIEW COMPARISON:  03/27/2020 FINDINGS: Heart mediastinal contours within normal limits. Patchy left lower lobe airspace opacity could reflect early pneumonia. No confluent opacity on the right. No effusions or acute bony abnormality. IMPRESSION: Patchy left lower lobe opacity, question early pneumonia. Electronically Signed   By: Charlett Nose M.D.   On: 09/07/2023 12:12   Labs on Admission: I have personally reviewed following labs  CBC: Recent Labs  Lab 09/07/23 1053  WBC 4.5  NEUTROABS 3.3  HGB 11.1*  HCT 34.1*  MCV 93.7  PLT 127*   Basic Metabolic Panel: Recent Labs  Lab 09/07/23 1053  NA 137  K 2.8*  CL 107  CO2 18*  GLUCOSE 109*  BUN 17  CREATININE 1.23*  CALCIUM 8.0*  MG 1.2*   GFR: CrCl cannot be calculated (Unknown ideal weight.).  Liver Function Tests: Recent Labs  Lab  09/07/23 1053  AST 27  ALT 12  ALKPHOS 42  BILITOT 0.8  PROT 6.4*  ALBUMIN 3.1*   Coagulation Profile: Recent Labs  Lab 09/07/23 1053  INR 1.2   Thyroid Function Tests: Recent Labs    09/07/23 1053  TSH 0.726  FREET4 0.85   Urine analysis:    Component Value Date/Time   COLORURINE YELLOW (A) 09/07/2023 1341   APPEARANCEUR CLEAR (A) 09/07/2023 1341   LABSPEC 1.014 09/07/2023 1341   PHURINE 5.0 09/07/2023 1341   GLUCOSEU NEGATIVE 09/07/2023  1341   HGBUR SMALL (A) 09/07/2023 1341   BILIRUBINUR NEGATIVE 09/07/2023 1341   KETONESUR 5 (A) 09/07/2023 1341   PROTEINUR 30 (A) 09/07/2023 1341   NITRITE NEGATIVE 09/07/2023 1341   LEUKOCYTESUR NEGATIVE 09/07/2023 1341   This document was prepared using Dragon Voice Recognition software and may include unintentional dictation errors.  Dr. Sedalia Muta [facility]  If 7PM-7AM, please contact overnight-coverage provider If 7AM-7PM, please contact day attending provider [email or web address]  09/07/2023, 2:43 PM

## 2023-09-07 NOTE — ED Provider Notes (Signed)
 [facility] Provider Note    Event Date/Time   First MD Initiated Contact with Patient 09/07/23 1051     (approximate)   History   Altered Mental Status   HPI  Christine Johns is a 83 y.o. female with history of A-fib on Eliquis who comes in with concerns for UTI.  Family have noted that she is not been able to walk as well for the past few days they seem really seems a little bit more weak.  They family is concerned about possible UTI.  I reviewed the the hospital discharge summary where patient had paroxysmal A-fib but was not on any metoprolol, diltiazem and was rate controlled.  Patient is a history of dementia, delirium  According to EMS they family that she has been more generalized weakness.  She has had UTIs in the past and they are worried about a UTI.  They deny any known falls.  Patient herself denies any chest pain, abdominal pain   Physical Exam   Triage Vital Signs: ED Triage Vitals  Encounter Vitals Group     BP 09/07/23 1046 (!) 159/98     Systolic BP Percentile --      Diastolic BP Percentile --      Pulse Rate 09/07/23 1049 (!) 50     Resp 09/07/23 1049 (!) 29     Temp 09/07/23 1045 99 F (37.2 C)     Temp Source 09/07/23 1045 Oral     SpO2 09/07/23 1049 95 %     Weight --      Height --      Head Circumference --      Peak Flow --      Pain Score 09/07/23 1049 0     Pain Loc --      Pain Education --      Exclude from Growth Chart --     Most recent vital signs: Vitals:   09/07/23 1046 09/07/23 1049  BP: (!) 159/98   Pulse:  (!) 50  Resp:  (!) 29  Temp:    SpO2:  95%     General: Awake, no distress.  CV:  Good peripheral perfusion.,  Tachycardic Resp:  Normal effort.  Abd:  No distention.  Soft and nontender Other:  No swelling in legs. Able to lift both legs off the bed.,    ED Results / Procedures / Treatments   Labs (all labs ordered are listed, but only abnormal results are displayed) Labs Reviewed   CULTURE, BLOOD (ROUTINE X 2)  CULTURE, BLOOD (ROUTINE X 2)  COMPREHENSIVE METABOLIC PANEL WITH GFR  LACTIC ACID, PLASMA  LACTIC ACID, PLASMA  CBC WITH DIFFERENTIAL/PLATELET  PROTIME-INR  URINALYSIS, W/ REFLEX TO CULTURE (INFECTION SUSPECTED)     EKG  My interpretation of EKG:  Atrial fibrillation with a rate of 129 without any ST elevation or T wave inversions, normal intervals  RADIOLOGY I have reviewed the xray personally and interpreted patchy left lower lobe opacity concerning for pneumonia   PROCEDURES:  Critical Care performed: Yes, see critical care procedure note(s)  .1-3 Lead EKG Interpretation  Performed by: Concha Se, MD Authorized by: Concha Se, MD     Interpretation: abnormal     ECG rate:  150   ECG rate assessment: tachycardic     Rhythm: atrial fibrillation     Ectopy: none     Conduction: normal   .Critical Care  Performed by: Concha Se, MD  Authorized by: Concha Se, MD   Critical care provider statement:    Critical care time (minutes):  30   Critical care was necessary to treat or prevent imminent or life-threatening deterioration of the following conditions:  Cardiac failure   Critical care was time spent personally by me on the following activities:  Development of treatment plan with patient or surrogate, discussions with consultants, evaluation of patient's response to treatment, examination of patient, ordering and review of laboratory studies, ordering and review of radiographic studies, ordering and performing treatments and interventions, pulse oximetry, re-evaluation of patient's condition and review of old charts    MEDICATIONS ORDERED IN ED: Medications  magnesium sulfate IVPB 2 g 50 mL (has no administration in time range)  diltiazem (CARDIZEM) injection 10 mg (has no administration in time range)  diltiazem (CARDIZEM) 125 mg in dextrose 5% 125 mL (1 mg/mL) infusion (has no administration in time range)  potassium  chloride SA (KLOR-CON M) CR tablet 40 mEq (has no administration in time range)  potassium chloride 10 mEq in 100 mL IVPB (has no administration in time range)  diltiazem (CARDIZEM) injection 10 mg (10 mg Intravenous Given 09/07/23 1118)     IMPRESSION / MDM / ASSESSMENT AND PLAN / ED COURSE  I reviewed the triage vital signs and the nursing notes.   Patient's presentation is most consistent with acute presentation with potential threat to life or bodily function.   Patient comes in with A-fib with RVR with increasing and weakness.  Differential includes electrolyte abnormalities, AKI, pneumonia, UTI abdomen soft and nontender.  Patient was given a bolus of diltiazem and briefly lowered heart rate but then spiked back up we will give another bolus and start on infusion.  Labs notable for low magnesium low potassium will give some IV repletion.  Patient did report a cough and x-ray was ordered that she found a concern for pneumonia which is consistent with patient's symptoms.  COVID, flu are still pending.  I will cover patient with antibiotics and get blood cultures, lactate.  I did discuss with patient's daughter who is her POA over the phone who does report that she did have a fall on Thursday, treated with triage EMS and said.  Will get CT head CT cervical given patient is on Eliquis.  She denies any other tenderness  Will admit to hospitalist.  The patient is on the cardiac monitor to evaluate for evidence of arrhythmia and/or significant heart rate changes.      FINAL CLINICAL IMPRESSION(S) / ED DIAGNOSES   Final diagnoses:  Atrial fibrillation with rapid ventricular response (HCC)  Pneumonia due to infectious organism, unspecified laterality, unspecified part of lung  Hypomagnesemia  Hypokalemia     Rx / DC Orders   ED Discharge Orders     None        Note:  This document was prepared using Dragon voice recognition software and may include unintentional dictation  errors.   Concha Se, MD 09/07/23 [phone]

## 2023-09-07 NOTE — Assessment & Plan Note (Signed)
 Home lisinopril 10 mg daily resumed

## 2023-09-07 NOTE — ED Notes (Signed)
 Family called RN into room. Family stated that patient has been refusing their attempted to re-orient patient when she pulls at monitoring devices. Patient is calm and cooperative with RN at this time. RN attempts to re-orient patient to not pull off monitoring devices. Family at bedside asked RN to call daughter. RN made phone call to daughter. Daughter stated that the patient will need a high dose of medication for agitation. Cox, DO made aware of situation.

## 2023-09-07 NOTE — ED Notes (Signed)
 RN changed patient. Patient had medium BM in her pants. Peri care provided and bedding changed.

## 2023-09-07 NOTE — Assessment & Plan Note (Signed)
 Home Eliquis resumed on admission Patient currently not on a rate control medication Continue diltiazem GGT

## 2023-09-08 ENCOUNTER — Inpatient Hospital Stay

## 2023-09-08 DIAGNOSIS — R918 Other nonspecific abnormal finding of lung field: Secondary | ICD-10-CM | POA: Diagnosis not present

## 2023-09-08 LAB — BASIC METABOLIC PANEL WITH GFR
Anion gap: 7 (ref 5–15)
Anion gap: 12 (ref 5–15)
BUN: 16 mg/dL (ref 8–23)
BUN: 15 mg/dL (ref 8–23)
CO2: 22 mmol/L (ref 22–32)
CO2: 18 mmol/L — ABNORMAL LOW (ref 22–32)
Calcium: 7.8 mg/dL — ABNORMAL LOW (ref 8.9–10.3)
Calcium: 8 mg/dL — ABNORMAL LOW (ref 8.9–10.3)
Chloride: 107 mmol/L (ref 98–111)
Chloride: 104 mmol/L (ref 98–111)
Creatinine, Ser: 1.13 mg/dL — ABNORMAL HIGH (ref 0.44–1.00)
Creatinine, Ser: 1.06 mg/dL — ABNORMAL HIGH (ref 0.44–1.00)
GFR, Estimated: 48 mL/min — ABNORMAL LOW (ref >60)
GFR, Estimated: 51 mL/min — ABNORMAL LOW (ref >60)
Glucose, Bld: 96 mg/dL (ref 70–99)
Glucose, Bld: 156 mg/dL — ABNORMAL HIGH (ref 70–99)
Potassium: 2.9 mmol/L — ABNORMAL LOW (ref 3.5–5.1)
Potassium: 2.9 mmol/L — ABNORMAL LOW (ref 3.5–5.1)
Sodium: 136 mmol/L (ref 135–145)
Sodium: 134 mmol/L — ABNORMAL LOW (ref 135–145)

## 2023-09-08 LAB — CBC
HCT: 29.7 % — ABNORMAL LOW (ref 36.0–46.0)
Hemoglobin: 10.2 g/dL — ABNORMAL LOW (ref 12.0–15.0)
MCH: 30.2 pg (ref 26.0–34.0)
MCHC: 34.3 g/dL (ref 30.0–36.0)
MCV: 87.9 fL (ref 80.0–100.0)
Platelets: 111 10*3/uL — ABNORMAL LOW (ref 150–400)
RBC: 3.38 MIL/uL — ABNORMAL LOW (ref 3.87–5.11)
RDW: 13.9 % (ref 11.5–15.5)
WBC: 4.4 10*3/uL (ref 4.0–10.5)
nRBC: 0 % (ref 0.0–0.2)

## 2023-09-08 LAB — RESPIRATORY PANEL BY PCR

## 2023-09-08 LAB — GLUCOSE, CAPILLARY
Glucose-Capillary: 142 mg/dL — ABNORMAL HIGH (ref 70–99)
Glucose-Capillary: 153 mg/dL — ABNORMAL HIGH (ref 70–99)

## 2023-09-08 LAB — URINE CULTURE: Culture: NO GROWTH

## 2023-09-08 LAB — BLOOD GAS, ARTERIAL
Acid-base deficit: 3.2 mmol/L — ABNORMAL HIGH (ref 0.0–2.0)
Bicarbonate: 19.5 mmol/L — ABNORMAL LOW (ref 20.0–28.0)
O2 Saturation: 94.7 %
Patient temperature: 37
pCO2 arterial: 28 mmHg — ABNORMAL LOW (ref 32–48)
pH, Arterial: 7.45 (ref 7.35–7.45)
pO2, Arterial: 63 mmHg — ABNORMAL LOW (ref 83–108)

## 2023-09-08 LAB — CBG MONITORING, ED: Glucose-Capillary: 230 mg/dL — ABNORMAL HIGH (ref 70–99)

## 2023-09-08 LAB — MAGNESIUM: Magnesium: 1.7 mg/dL (ref 1.7–2.4)

## 2023-09-08 LAB — PHOSPHORUS: Phosphorus: 2.5 mg/dL (ref 2.5–4.6)

## 2023-09-08 LAB — TROPONIN I (HIGH SENSITIVITY): Troponin I (High Sensitivity): 78 ng/L — ABNORMAL HIGH (ref <18)

## 2023-09-08 MED ORDER — METRONIDAZOLE 500 MG/100ML IV SOLN
500.0000 mg | Freq: Two times a day (BID) | INTRAVENOUS | Status: AC
  Administered 2023-09-09 – 2023-09-13 (×9): 500 mg via INTRAVENOUS
  Filled 2023-09-08 (×9): qty 100

## 2023-09-08 MED ORDER — METHYLPREDNISOLONE SODIUM SUCC 40 MG IJ SOLR
40.0000 mg | Freq: Two times a day (BID) | INTRAMUSCULAR | Status: AC
  Administered 2023-09-08 – 2023-09-09 (×3): 40 mg via INTRAVENOUS
  Filled 2023-09-08 (×3): qty 1

## 2023-09-08 MED ORDER — IPRATROPIUM-ALBUTEROL 0.5-2.5 (3) MG/3ML IN SOLN
3.0000 mL | Freq: Four times a day (QID) | RESPIRATORY_TRACT | Status: DC | PRN
  Filled 2023-09-08: qty 3

## 2023-09-08 MED ORDER — ENSURE ENLIVE PO LIQD
237.0000 mL | Freq: Two times a day (BID) | ORAL | Status: DC
  Administered 2023-09-09: 237 mL via ORAL

## 2023-09-08 MED ORDER — METOPROLOL TARTRATE 5 MG/5ML IV SOLN
2.5000 mg | Freq: Two times a day (BID) | INTRAVENOUS | Status: DC
  Administered 2023-09-08 – 2023-09-09 (×2): 2.5 mg via INTRAVENOUS
  Filled 2023-09-08 (×2): qty 5

## 2023-09-08 MED ORDER — ROCURONIUM BROMIDE 10 MG/ML (PF) SYRINGE
PREFILLED_SYRINGE | INTRAVENOUS | Status: AC
  Filled 2023-09-08: qty 10

## 2023-09-08 MED ORDER — SODIUM BICARBONATE 8.4 % IV SOLN
50.0000 meq | Freq: Once | INTRAVENOUS | Status: AC
  Administered 2023-09-08: 50 meq via INTRAVENOUS
  Filled 2023-09-08: qty 50

## 2023-09-08 MED ORDER — ETOMIDATE 2 MG/ML IV SOLN
INTRAVENOUS | Status: AC
  Filled 2023-09-08: qty 10

## 2023-09-08 MED ORDER — PANTOPRAZOLE SODIUM 40 MG IV SOLR
40.0000 mg | Freq: Every day | INTRAVENOUS | Status: AC
  Administered 2023-09-08 – 2023-09-09 (×2): 40 mg via INTRAVENOUS
  Filled 2023-09-08 (×2): qty 10

## 2023-09-08 MED ORDER — POTASSIUM CHLORIDE 10 MEQ/100ML IV SOLN
10.0000 meq | INTRAVENOUS | Status: AC
Start: 2023-09-08 — End: 2023-09-08
  Administered 2023-09-08 (×4): 10 meq via INTRAVENOUS
  Filled 2023-09-08 (×2): qty 100

## 2023-09-08 MED ORDER — CHLORHEXIDINE GLUCONATE CLOTH 2 % EX PADS
6.0000 | MEDICATED_PAD | Freq: Every day | CUTANEOUS | Status: DC
  Administered 2023-09-09 – 2023-09-16 (×8): 6 via TOPICAL

## 2023-09-08 MED ORDER — POTASSIUM CHLORIDE CRYS ER 20 MEQ PO TBCR: 40.0000 meq | EXTENDED_RELEASE_TABLET | Freq: Once | ORAL | Status: DC

## 2023-09-08 MED ORDER — METOPROLOL TARTRATE 5 MG/5ML IV SOLN
10.0000 mg | INTRAVENOUS | Status: AC
  Administered 2023-09-08: 10 mg via INTRAVENOUS

## 2023-09-08 MED ORDER — METOPROLOL TARTRATE 50 MG PO TABS: 50.0000 mg | ORAL_TABLET | Freq: Two times a day (BID) | ORAL | Status: DC

## 2023-09-08 MED ORDER — IPRATROPIUM-ALBUTEROL 0.5-2.5 (3) MG/3ML IN SOLN
3.0000 mL | Freq: Four times a day (QID) | RESPIRATORY_TRACT | Status: DC
  Administered 2023-09-09 (×2): 3 mL via RESPIRATORY_TRACT
  Filled 2023-09-08 (×2): qty 3

## 2023-09-08 MED ORDER — PANTOPRAZOLE SODIUM 40 MG PO TBEC
40.0000 mg | DELAYED_RELEASE_TABLET | Freq: Every day | ORAL | Status: DC
  Administered 2023-09-10 – 2023-09-16 (×7): 40 mg via ORAL
  Filled 2023-09-08 (×7): qty 1

## 2023-09-08 MED ORDER — PREDNISONE 20 MG PO TABS
40.0000 mg | ORAL_TABLET | Freq: Every day | ORAL | Status: AC
  Administered 2023-09-10 – 2023-09-13 (×4): 40 mg via ORAL
  Filled 2023-09-08 (×4): qty 2

## 2023-09-08 MED ORDER — POTASSIUM CHLORIDE 10 MEQ/100ML IV SOLN
10.0000 meq | INTRAVENOUS | Status: AC
  Administered 2023-09-08 – 2023-09-09 (×6): 10 meq via INTRAVENOUS
  Filled 2023-09-08 (×6): qty 100

## 2023-09-08 MED ORDER — LEVALBUTEROL HCL 0.63 MG/3ML IN NEBU
0.6300 mg | INHALATION_SOLUTION | RESPIRATORY_TRACT | Status: AC
  Administered 2023-09-08: 0.63 mg via RESPIRATORY_TRACT
  Filled 2023-09-08: qty 3

## 2023-09-08 NOTE — Progress Notes (Signed)
 [facility] Progress Note  Patient: Christine Johns    YQM:[id number]  DOA: 09/07/2023     Date of Service: the patient was seen and examined on 09/08/2023  Chief Complaint  Patient presents with   Altered Mental Status   Brief hospital course: Ms. Christine Johns is an 83 year old female with history of atrial fibrillation on Eliquis, currently not on rate control, hypertension, dementia, hyperlipidemia, who presents emergency department for chief concerns of altered mental status.   Patient presented via EMS and per nursing documentation, patient received sodium chloride 500 mL liter bolus per EMS.   Vitals in the ED showed temperature 99, respiration rate 29, improved to 22, heart rate initially 50, currently 80, and on EKG showed atrial fibrillation with rate of 129, QTc 552.  Blood pressure is 159/98, SpO2 95% on room air.   Serum sodium is 135, potassium 2.8, chloride 107, bicarb 18, BUN of 17, serum creatinine 1.27, EGFR 43, nonfasting blood glucose 109, WBC 4.5, hemoglobin 11.2, platelets of 127.   High-sensitivity troponin 65.  Lactic acid 1.6.  Magnesium 1.2.  COVID/influenza A/influenza B/RSV PCR were negative.   Blood cultures x 2 are in process.   ED treatment: Doxycycline 100 mg p.o. one-time dose, diltiazem 10 mg x 2 doses, potassium chloride 40 mEq p.o. one-time dose, ceftriaxone 2 g IV one-time dose, magnesium 2 g IV one-time dose, sodium chloride 1 L bolus, diltiazem gtt. initiated.   Assessment and Plan:  # Multilobar pneumonia due to metapneumovirus Azithromycin 500 mg IV daily to complete a 5-day course, ceftriaxone 2 g IV daily to complete a 5-day course, sodium chloride 1 L bolus ordered by EDP LR 500 mL bolus on admission Incentive spirometry, flutter valve Oxygen supplementation as needed to maintain SpO2 greater than 92% 4/7 had an episode of aspiration and hypoxia, suction was done, hypoxia improved, patient did not require intubation. Added Flagyl to cover  anaerobes, patient is allergic to penicillin RVP positive for metapneumovirus, continue droplet precautions Started Solu-Medrol 40 mg IV twice daily x 3 doses followed by prednisone 40 mg p.o. daily for 4 days  # Atrial fibrillation with rapid ventricular response (HCC) Home Eliquis resumed on admission Patient currently not on a rate control medication Continue diltiazem GGT  Lopressor 10 mg IV 1 dose given Started Lopressor 50 mg p.o. twice daily  Elevated troponin Suspect secondary to demand ischemia in setting of atrial fibrillation with RVR Continue to monitor surgery second high sensitive troponin   # Acute gastroenteritis Check C. difficile and GI pathogen Monitor electrolytes   # Metabolic acidosis Repleted bicarb IV Monitor electrolytes and replete as needed  Stage 3b chronic kidney disease (HCC) At baseline   Mixed hyperlipidemia Lovastatin equivalent, pravastatin 40 mg nightly ordered   History of normocytic normochromic anemia At baseline   Essential hypertension Home lisinopril 10 mg pod, Dc'd 4/7 started Lopressor 50 mg p.o. twice daily due to A-fib with RVR   Hypomagnesemia Status magnesium 2 g IV ordered by EDP Recheck magnesium level in the morning   Acute hypokalemia due to diarrhea Status post potassium chloride 40 mEq p.o. one-time dose, potassium chloride 10 mill equivalent, 2 doses ordered by EDP LR 500 mL liter bolus on admission Monitor electrolytes 4/7 potassium 2.9, repleted IV   Body mass index is 20.03 kg/m.  Interventions:  Diet: Regular diet DVT Prophylaxis: Therapeutic Anticoagulation with Eliquis    Advance goals of care discussion: Full code  Family Communication: family was not present at bedside,  at the time of interview.  The pt provided permission to discuss medical plan with the family. Opportunity was given to ask question and all questions were answered satisfactorily.  Patient has dementia AO x 1 4/7 discussed with  patient's daughter over the phone and discussed with caregiver at bedside   Disposition:  Pt is from home, admitted with respiratory failure due to pneumonia, A-fib with RVR, diarrhea, hypokalemia, still has respiratory failure and A-fib with RVR, diarrhea and electrolyte imbalance, which precludes a safe discharge. Discharge to home, when stable, may need few days to improve.  Subjective: No significant events overnight, 1 time patient was in respiratory distress but due to dementia she is unable to offer any complaints.  After lunch she got choked and became hypoglycemic, suction was done, did not require intubation.  SLP eval done, recommended regular diet with precautions. D/w patient's daughter over the phone.  Management plan discussed.  Physical Exam: General: Mild-moderate respiratory distress  Appear in resp distress, affect anxious and AAO x 1 depressed Eyes: PERRLA ENT: Oral Mucosa Clear, moist  Neck: no JVD,  Cardiovascular: S1 and S2 Present, no Murmur,  Respiratory: Equal air entry bilaterally, significant crackles bilaterally and wheezes  Abdomen: Bowel Sound present, Soft and no tenderness,  Skin: no rashes Extremities: no Pedal edema, no calf tenderness Neurologic: without any new focal findings Gait not checked due to patient safety concerns  Vitals:   09/08/23 1445 09/08/23 1515 09/08/23 1650 09/08/23 1701  BP: 125/84 124/79 (!) 147/104   Pulse: (!) 44 84 (!) 123   Resp: (!) 26 (!) 30    Temp:  98.8 F (37.1 C) 97.8 F (36.6 C)   TempSrc:  Oral    SpO2: 97% 99% 96%   Height:    5\' 3"  (1.6 m)    Intake/Output Summary (Last 24 hours) at 09/08/2023 1706 Last data filed at 09/08/2023 1547 Gross per 24 hour  Intake 825.13 ml  Output --  Net 825.13 ml   There were no vitals filed for this visit.  Data Reviewed: I have personally reviewed and interpreted daily labs, tele strips, imagings as discussed above. I reviewed all nursing notes, pharmacy notes, vitals,  pertinent old records I have discussed plan of care as described above with RN and patient/family.  CBC: Recent Labs  Lab 09/07/23 1053 09/08/23 0424  WBC 4.5 4.4  NEUTROABS 3.3  --   HGB 11.1* 10.2*  HCT 34.1* 29.7*  MCV 93.7 87.9  PLT 127* 111*   Basic Metabolic Panel: Recent Labs  Lab 09/07/23 1053 09/08/23 0424  NA 137 136  K 2.8* 2.9*  CL 107 107  CO2 18* 22  GLUCOSE 109* 96  BUN 17 16  CREATININE 1.23* 1.13*  CALCIUM 8.0* 7.8*  MG 1.2* 1.7  PHOS  --  2.5    Studies: DG Chest Port 1 View Result Date: 09/08/2023 CLINICAL DATA:  Aspiration into airway. EXAM: PORTABLE CHEST 1 VIEW COMPARISON:  Chest radiograph dated 09/07/2023. FINDINGS: Patient is rotated to the right. The heart size and mediastinal contours are unchanged. Increasing left basilar opacity. Right lung is clear. No sizable pleural effusion or pneumothorax. No acute osseous abnormality. IMPRESSION: Increasing left basilar opacity, which could reflect aspiration or worsening pneumonia. Electronically Signed   By: Hart Robinsons M.D.   On: 09/08/2023 15:28    Scheduled Meds:  apixaban  2.5 mg Oral BID   calcium-vitamin D  2 tablet Oral Daily   cholecalciferol  1,000 Units Oral  Daily   etomidate       feeding supplement  237 mL Oral BID BM   lisinopril  10 mg Oral Daily   multivitamin with minerals  1 tablet Oral Daily   pravastatin  40 mg Oral q1800   rocuronium       Continuous Infusions:  azithromycin Stopped (09/08/23 1035)   cefTRIAXone (ROCEPHIN)  IV Stopped (09/08/23 1002)   diltiazem (CARDIZEM) infusion 12.5 mg/hr (09/08/23 1504)   metronidazole     PRN Meds: acetaminophen **OR** acetaminophen, etomidate, haloperidol lactate, ipratropium-albuterol, ondansetron **OR** ondansetron (ZOFRAN) IV, rocuronium, senna-docusate  Time spent: 55 minutes  Author: Gillis Santa. MD [facility] 09/08/2023 5:06 PM  To reach On-call, see care teams to locate the attending and reach out to them via  [email or web address]. If 7PM-7AM, please contact night-coverage If you still have difficulty reaching the attending provider, please page the [facility] (Director on Call) for [facility] on amion for assistance.

## 2023-09-08 NOTE — ED Notes (Signed)
 Incentive spirometer given to Pt with teachback. Pt O2 dipping placed on 3L with water to help with dried nares.

## 2023-09-08 NOTE — Plan of Care (Addendum)
 Discussed with patient plan of care for this shift, pain management and Bipap with some teach back displayed.  Plan is to keep Bipap on all night.  Daughter is staying with her - getting recliner cleaned.  And reminded her that patient is NPO while on the Bipap.    Patient had high MEWS score moved to SD/ICU unit for higher level of care  Problem: Education: Goal: Knowledge of General Education information will improve Description: Including pain rating scale, medication(s)/side effects and non-pharmacologic comfort measures Outcome: Progressing   Problem: Health Behavior/Discharge Planning: Goal: Ability to manage health-related needs will improve Outcome: Not Progressing

## 2023-09-08 NOTE — ED Notes (Signed)
 Fall precautions in place for Pt. This RN placed fall band, fall grip socks, bed alarm and fall sign.

## 2023-09-08 NOTE — ED Notes (Addendum)
 Pt had a respiratory event, this RN was with another Pt when EDP came and called me because O2 was dipping in toe 60s. This RN went to assess and Pt was sturggling to breath, pale, and purple lips, d/t to trying to eat lunch Pt O2 was pulled off. This RN placed Pt up in bed, suctioned Pt as she was choking on her lunch and placed Pt on non-re breather, Cardizem paused, EDP thought Pt was going to have to intubated but didn't. Pt coughed and was starting to respond to interventions.  RRT called, RT called, Attending called and crash cart brought into room.   This RN asked for NPO and XRAY order to assess Pt post choking on food. See new orders.

## 2023-09-08 NOTE — ED Notes (Signed)
 This RN notified family to give sister Christine Johns an update as she is not in the Pt chart, and would be a HIPAA violation.

## 2023-09-08 NOTE — Plan of Care (Signed)

## 2023-09-08 NOTE — ED Provider Notes (Signed)
 Event note:  I was called by clerk on pod B to evaluate patient 16 when able.  I went immediately to the room, found her to be eating a meal and satting 46%.  Nasal cannula in place though on my eval appears it had become disconnected from the wall.  Sitter was bedside.  Oxygen reattached.  Nonrebreather placed.  Patient then lost consciousness.  Appears she may have aspirated some at this time as well.  Aggressive suctioning performed.  POC glucose within normal limits.  Prepped for intubation, fortunately with sternal rub patient did awake.  RT bedside, code equipment available, intubation equipment available.  Oxygen sats improved to the 90s and I was able to wean her liters nasal cannula.  Patient did begin speaking with me again as well though her respiratory status remains tenuous.  Sign off given to hospitalist managing patient.  The team is available for intubation as needed, will defer at this time given oxygenation is improved and mentation has improved, patient is now protecting airway.  Overall suspect combination of oxygen being disconnected as well as some element of aspiration.   .Critical Care  Performed by: Marinell Blight, MD Authorized by: Marinell Blight, MD   Critical care provider statement:    Critical care time (minutes):  30   Critical care was necessary to treat or prevent imminent or life-threatening deterioration of the following conditions:  Respiratory failure   Critical care was time spent personally by me on the following activities:  Development of treatment plan with patient or surrogate, discussions with consultants, evaluation of patient's response to treatment, examination of patient, ordering and review of laboratory studies, ordering and review of radiographic studies, ordering and performing treatments and interventions, pulse oximetry, re-evaluation of patient's condition and review of old charts   I assumed direction of critical care for this patient from  another provider in my specialty: yes     Care discussed with: admitting provider       Marinell Blight, MD 09/08/23 1247

## 2023-09-08 NOTE — Progress Notes (Signed)
 Patient transferred to ICU bed 2. Transported with the respiratory therapist and NT. Family notified at bedside of transfer. Report given to [place], [place].

## 2023-09-08 NOTE — Evaluation (Signed)
 Clinical/Bedside Swallow Evaluation Patient Details  Name: Christine Johns MRN: [id number] Date of Birth: 17-Mar-1941  Today's Date: 09/08/2023 Time: SLP Start Time (ACUTE ONLY): 1405 SLP Stop Time (ACUTE ONLY): 1505 SLP Time Calculation (min) (ACUTE ONLY): 60 min  Past Medical History:  Past Medical History:  Diagnosis Date   Dementia (HCC)    Hypertension    Past Surgical History: History reviewed. No pertinent surgical history. HPI:  Pt is an 83 year old female with history of Dementia, atrial fibrillation on Eliquis, currently not on rate control, hypertension. hyperlipidemia, who presents emergency department for chief concerns of altered mental status.     Patient presented via EMS and per nursing documentation, patient received sodium chloride 500 mL liter bolus per EMS.   CXR at admit: Patchy left lower lobe opacity, question early pneumonia. Last CXR was in 2021.   Caregivers stated concern for "UTI" this admit along w/ pneumonia. Pt has Caregivers in the home.    Assessment / Plan / Recommendation  Clinical Impression   Pt seen for BSE today s/p NSG reported a "choking" event(O2 desat) during pt's Lunch meal. Pt has a Baseline of Dementia. Caregivers present in room. Pt awake, verbal and engaged during session answering basic questons re: self, persons in room. Quite pleasant and smiled often. She followed 1step commands w/ cue. Daughter on phone during some of the session; Caregivers present.  ANS stable: O2 sats 98%, RR 20, HR 80s.  On Time O2 support 2-7L post event at lunch; afebrile. WBC WNL.  Pt appears to present w/ functional oropharyngeal phase swallowing in setting of declined Cognitive status; Baseline Dementia dx'd per chart. Pt did have a "choking" event during the lunch meal but Caregivers denied any such in the home. Caregivers/Family reported pt has "chewed her Pills sometimes" at home.  ANY Cognitive decline can impact overall awareness/timing of swallow and safety during  po tasks which increases risk for aspiration, choking. Pt's risk for aspiration can be reduced when following general aspiration precautions and using a slightly modified diet consistency of cut meats/foods well-moistened for cohesion and easy chewing/eating.  Less Distractions/Talking was encouraged/followed during the session/po intake -- pt required min verbal cues for follow through during po intake/tasks and self-feeding.        Pt consumed several trials of ice chips, thin liquids VIA CUP, purees, and softened solids w/ NO overt, clinical s/s of aspiration noted: no decline in vocal quality, no cough, and no decline in respiratory status during/post trials. O2 sats remained 97-98% during/post po intake. Oral phase was functional for bolus management, mastication, and oral clearing of the boluses given. Mastication of softened solids adequate given Time and moistening the foods well in small bites. Pt was able to help self-feeding w/ cues d/t the Cognitive decline; this improves safety of swallowing.  OM Exam appeared [facility] w/ No unilateral weakness noted. Speech clear.         In setting of baseline Dementia and Cognitive decline, recommend continue w/ a fairly Regular diet for options of preference BUT Cut and Well-moistened foods for ease of mastication/oral phase clearing; thin liquids VIA CUP (no straws). Recommend general aspiration precautions; reduce Distractions/Talking during meals. Engage pt for self-feeding during meals. Give Time for oral clearing b/t bites. Pills Crushed vs Whole in Puree for safer swallowing -- Caregivers/Family reported pt has been "chewing" Pills at home, so Crushed may be best. Supervision w/ all oral intake d/t Cognitive decline. MD/NSG updated.   ST services recommends follow up  w/ Dietician and Palliative Care for support and education re: impact of Cognitive decline/Dementia on oral intake/swallowing. Suspect pt is close to/at her baseline. Precautions posted in  chart. No further Speech needs indicated. SLP Visit Diagnosis: Dysphagia, unspecified (R13.10) (has Baseline Dementia - distracted easily)    Aspiration Risk   (reduced following general aspiration precautions)    Diet Recommendation   Thin;Age appropriate regular;Dysphagia 3 (mechanical soft) (cut/chopped meats; moistened foods vs particulates/dries) = a fairly Regular diet for options of preference BUT Cut and Well-moistened foods for ease of mastication/oral phase clearing; thin liquids VIA CUP (no straws). Recommend general aspiration precautions; reduce Distractions/Talking during meals. Engage pt for self-feeding during meals. Give Time for oral clearing b/t bites. Supervision w/ all oral intake d/t Cognitive decline.   Medication Administration: Crushed with puree (can try Whole in Puree but pt is "chewing" Pills at home per Family report)    Other  Recommendations Recommended Consults:  (Dietician) Oral Care Recommendations: Oral care BID;Oral care before and after PO;Staff/trained caregiver to provide oral care (support pt)    Recommendations for follow up therapy are one component of a multi-disciplinary discharge planning process, led by the attending physician.  Recommendations may be updated based on patient status, additional functional criteria and insurance authorization.  Follow up Recommendations No SLP follow up      Assistance Recommended at Discharge  FULL at meals  Functional Status Assessment Patient has not had a recent decline in their functional status  Frequency and Duration  (n/a)   (n/a)       Prognosis Prognosis for improved oropharyngeal function: Good Barriers to Reach Goals: Cognitive deficits;Time post onset;Severity of deficits Barriers/Prognosis Comment: Dementia baseline; distracted  easiliy      Swallow Study   General Date of Onset: 09/07/23 HPI: Pt is an 83 year old female with history of Dementia, atrial fibrillation on Eliquis, currently not  on rate control, hypertension. hyperlipidemia, who presents emergency department for chief concerns of altered mental status.     Patient presented via EMS and per nursing documentation, patient received sodium chloride 500 mL liter bolus per EMS.   CXR at admit: Patchy left lower lobe opacity, question early pneumonia. Last CXR was in 2021.   Caregivers stated concern for "UTI" this admit along w/ pneumonia. Pt has Caregivers in the home. Type of Study: Bedside Swallow Evaluation Previous Swallow Assessment: none Diet Prior to this Study: NPO (had been a regular diet, thins prior to choking event at lunch meal) Temperature Spikes Noted: No (wbc 4.4) Respiratory Status: Nasal cannula (had been 2L; 7L post choking event at lunch meal) History of Recent Intubation: No Behavior/Cognition: Alert;Cooperative;Pleasant mood;Confused;Distractible;Requires cueing Oral Cavity Assessment: Within Functional Limits Oral Care Completed by SLP: Recent completion by staff Oral Cavity - Dentition: Adequate natural dentition Vision: Functional for self-feeding Self-Feeding Abilities: Able to feed self;Needs assist;Needs set up (Supervised) Patient Positioning: Upright in bed (needed assistance positioning more upright) Baseline Vocal Quality: Normal;Low vocal intensity Volitional Cough: Strong Volitional Swallow: Able to elicit    Oral/Motor/Sensory Function Overall Oral Motor/Sensory Function: Within functional limits   Ice Chips Ice chips: Within functional limits Presentation: Spoon (fed; 2 trials)   Thin Liquid Thin Liquid: Within functional limits Presentation: Cup;Self Fed (10 trials) Other Comments: no straws used    Nectar Thick Nectar Thick Liquid: Not tested   Honey Thick Honey Thick Liquid: Not tested   Puree Puree: Within functional limits Presentation: Spoon (feeding support; 8 trials)   Solid  Solid: Within functional limits (moistened well) Presentation: Self Fed (4 trials) Other  Comments: REDUCED talking/distractions during oral intake        Jerilynn Som, MS, CCC-SLP Speech Language Pathologist Rehab Services; [facility] [place] [phone] (ascom) Aniqua Briere 09/08/2023,3:26 PM

## 2023-09-08 NOTE — Progress Notes (Addendum)
   09/08/23 1238  Spiritual Encounters  Type of Visit Initial  Care provided to: Patient (Caregiver at bedside)  Conversation partners present during encounter Nurse;Physician  Referral source Code page  Reason for visit Urgent spiritual support  OnCall Visit Yes  Spiritual Framework  Presenting Themes Meaning/purpose/sources of inspiration;Coping tools;Courage hope and growth;Values and beliefs  Interventions  Spiritual Care Interventions Made Established relationship of care and support;Compassionate presence;Reflective listening;Encouragement  Intervention Outcomes  Outcomes Connection to spiritual care;Awareness around self/spiritual resourses;Awareness of health;Awareness of support   Niece also came to be w/Pt; Daughter in [place] and was in contact by phone

## 2023-09-09 ENCOUNTER — Encounter: Payer: Self-pay | Admitting: Internal Medicine

## 2023-09-09 ENCOUNTER — Inpatient Hospital Stay (HOSPITAL_COMMUNITY)
Admit: 2023-09-09 | Discharge: 2023-09-09 | Disposition: A | Discharge status: Home / Self Care | Attending: Student | Admitting: Student

## 2023-09-09 DIAGNOSIS — R918 Other nonspecific abnormal finding of lung field: Secondary | ICD-10-CM | POA: Diagnosis not present

## 2023-09-09 DIAGNOSIS — I4891 Unspecified atrial fibrillation: Secondary | ICD-10-CM | POA: Diagnosis not present

## 2023-09-09 DIAGNOSIS — R7989 Other specified abnormal findings of blood chemistry: Secondary | ICD-10-CM

## 2023-09-09 DIAGNOSIS — E440 Moderate protein-calorie malnutrition: Secondary | ICD-10-CM | POA: Insufficient documentation

## 2023-09-09 LAB — PHOSPHORUS
Phosphorus: 2 mg/dL — ABNORMAL LOW (ref 2.5–4.6)
Phosphorus: 3.8 mg/dL (ref 2.5–4.6)

## 2023-09-09 LAB — BASIC METABOLIC PANEL WITH GFR
Anion gap: 9 (ref 5–15)
Anion gap: 12 (ref 5–15)
BUN: 16 mg/dL (ref 8–23)
BUN: 18 mg/dL (ref 8–23)
CO2: 19 mmol/L — ABNORMAL LOW (ref 22–32)
CO2: 20 mmol/L — ABNORMAL LOW (ref 22–32)
Calcium: 7.8 mg/dL — ABNORMAL LOW (ref 8.9–10.3)
Calcium: 7.8 mg/dL — ABNORMAL LOW (ref 8.9–10.3)
Chloride: 104 mmol/L (ref 98–111)
Chloride: 106 mmol/L (ref 98–111)
Creatinine, Ser: 1.1 mg/dL — ABNORMAL HIGH (ref 0.44–1.00)
Creatinine, Ser: 1.24 mg/dL — ABNORMAL HIGH (ref 0.44–1.00)
GFR, Estimated: 49 mL/min — ABNORMAL LOW (ref >60)
GFR, Estimated: 43 mL/min — ABNORMAL LOW (ref >60)
Glucose, Bld: 175 mg/dL — ABNORMAL HIGH (ref 70–99)
Glucose, Bld: 218 mg/dL — ABNORMAL HIGH (ref 70–99)
Potassium: 3.6 mmol/L (ref 3.5–5.1)
Potassium: 3.4 mmol/L — ABNORMAL LOW (ref 3.5–5.1)
Sodium: 132 mmol/L — ABNORMAL LOW (ref 135–145)
Sodium: 138 mmol/L (ref 135–145)

## 2023-09-09 LAB — CBC
HCT: 30.8 % — ABNORMAL LOW (ref 36.0–46.0)
Hemoglobin: 10.8 g/dL — ABNORMAL LOW (ref 12.0–15.0)
MCH: 30.3 pg (ref 26.0–34.0)
MCHC: 35.1 g/dL (ref 30.0–36.0)
MCV: 86.3 fL (ref 80.0–100.0)
Platelets: 123 10*3/uL — ABNORMAL LOW (ref 150–400)
RBC: 3.57 MIL/uL — ABNORMAL LOW (ref 3.87–5.11)
RDW: 13.5 % (ref 11.5–15.5)
WBC: 11.4 10*3/uL — ABNORMAL HIGH (ref 4.0–10.5)
nRBC: 0 % (ref 0.0–0.2)

## 2023-09-09 LAB — GLUCOSE, CAPILLARY
Glucose-Capillary: 177 mg/dL — ABNORMAL HIGH (ref 70–99)
Glucose-Capillary: 168 mg/dL — ABNORMAL HIGH (ref 70–99)
Glucose-Capillary: 160 mg/dL — ABNORMAL HIGH (ref 70–99)
Glucose-Capillary: 152 mg/dL — ABNORMAL HIGH (ref 70–99)
Glucose-Capillary: 209 mg/dL — ABNORMAL HIGH (ref 70–99)

## 2023-09-09 LAB — MAGNESIUM
Magnesium: 1.3 mg/dL — ABNORMAL LOW (ref 1.7–2.4)
Magnesium: 2.9 mg/dL — ABNORMAL HIGH (ref 1.7–2.4)

## 2023-09-09 LAB — LACTIC ACID, PLASMA: Lactic Acid, Venous: 1.5 mmol/L (ref 0.5–1.9)

## 2023-09-09 MED ORDER — METOPROLOL TARTRATE 25 MG PO TABS
12.5000 mg | ORAL_TABLET | Freq: Four times a day (QID) | ORAL | Status: DC
  Administered 2023-09-10 (×2): 12.5 mg via ORAL
  Filled 2023-09-09 (×3): qty 1

## 2023-09-09 MED ORDER — IPRATROPIUM-ALBUTEROL 0.5-2.5 (3) MG/3ML IN SOLN
3.0000 mL | Freq: Three times a day (TID) | RESPIRATORY_TRACT | Status: DC
  Administered 2023-09-09 – 2023-09-11 (×5): 3 mL via RESPIRATORY_TRACT
  Filled 2023-09-09 (×5): qty 3

## 2023-09-09 MED ORDER — QUETIAPINE FUMARATE 25 MG PO TABS
12.5000 mg | ORAL_TABLET | Freq: Two times a day (BID) | ORAL | Status: DC
  Filled 2023-09-09: qty 1

## 2023-09-09 MED ORDER — POTASSIUM CHLORIDE CRYS ER 20 MEQ PO TBCR
40.0000 meq | EXTENDED_RELEASE_TABLET | Freq: Once | ORAL | Status: AC
  Administered 2023-09-09: 40 meq via ORAL
  Filled 2023-09-09: qty 2

## 2023-09-09 MED ORDER — QUETIAPINE FUMARATE 25 MG PO TABS
50.0000 mg | ORAL_TABLET | Freq: Two times a day (BID) | ORAL | Status: DC
  Administered 2023-09-09: 50 mg via ORAL
  Filled 2023-09-09: qty 2

## 2023-09-09 MED ORDER — MAGNESIUM SULFATE 4 GM/100ML IV SOLN
4.0000 g | Freq: Once | INTRAVENOUS | Status: AC
  Administered 2023-09-09: 4 g via INTRAVENOUS
  Filled 2023-09-09: qty 100

## 2023-09-09 MED ORDER — SODIUM BICARBONATE 8.4 % IV SOLN
100.0000 meq | Freq: Once | INTRAVENOUS | Status: AC
  Administered 2023-09-09: 100 meq via INTRAVENOUS
  Filled 2023-09-09: qty 50

## 2023-09-09 MED ORDER — SODIUM CHLORIDE 0.9 % IV BOLUS
500.0000 mL | Freq: Once | INTRAVENOUS | Status: AC
  Administered 2023-09-09: 500 mL via INTRAVENOUS

## 2023-09-09 MED ORDER — QUETIAPINE FUMARATE 25 MG PO TABS: 25.0000 mg | ORAL_TABLET | Freq: Two times a day (BID) | ORAL | Status: DC

## 2023-09-09 MED ORDER — ENSURE ENLIVE PO LIQD
237.0000 mL | Freq: Three times a day (TID) | ORAL | Status: DC
  Administered 2023-09-09 – 2023-09-15 (×14): 237 mL via ORAL

## 2023-09-09 MED ORDER — SODIUM PHOSPHATES 45 MMOLE/15ML IV SOLN
30.0000 mmol | Freq: Once | INTRAVENOUS | Status: AC
  Administered 2023-09-09: 30 mmol via INTRAVENOUS
  Filled 2023-09-09: qty 10

## 2023-09-09 NOTE — Progress Notes (Signed)
 PT Cancellation Note  Patient Details Name: Christine Johns MRN: [id number] DOB: [birth year]   Cancelled Treatment:    Reason Eval/Treat Not Completed:  (Consult received and chart reviewed. Per primary RN, patient preparing from transition off of BiPAP. Requests therapist hold to assess respiratory stability on RA prior to initiation of evaluation. Will re-attempt at later time this date.)   Arthelia Callicott H. Manson Passey, PT, DPT, NCS 09/09/23, 9:30 AM [phone]

## 2023-09-09 NOTE — Progress Notes (Addendum)
 Speech Language Pathology Treatment: Dysphagia  Patient Details Name: Christine Johns MRN: [id number] DOB: [birth year] Today's Date: 09/09/2023 Time: 1030-1100 SLP Time Calculation (min) (ACUTE ONLY): 30 min  Assessment / Plan / Recommendation Clinical Impression  Pt seen for f/u re: swallowing and general aspiration precautions per NSG request in setting of pt's Baseline (Chronic) comorbidities including Dementia/Cognitive decline and yesterday's "choking" event(O2 desat) during pt's Lunch meal.  OF NOTE: pt was agitated last night(Dementia) and received Haldol and Seroquel and needed O2 support via BIPAP. BIPAP was removed this morning; currently on 4L [place] tolerating well. Pt awake, verbal to say "hello" and wave hand to this therapist. She smiled often. Daughter present. Remains min drowsy per NSG report.   Met w/ pt and Daughter to discuss pt's swallowing in general, in setting of Baseline Dementia/Cognitive decline; as well as impact of any multiple medical issues/comorbidities and any need for Sedating Medications -- all of these issues can increase risk for aspiration/choking if not careful. Pt has been recommended to be 100% Supervised during all oral intake; Pills given in Puree.  Discussed w/ Daughter that at BSE yesterday, pt appears to present w/ functional oropharyngeal phase swallowing of po's during a fully Awake State given Supervision, support, and following general aspiration precautions.  Discussed  that ANY Cognitive decline especially during acute/chronic illness can impact overall awareness/timing of swallow and safety during po tasks which increases risk for aspiration, choking. Pt's risk for aspiration can be reduced when following general aspiration precautions and using a slightly modified diet consistency of cut meats/foods well-moistened for cohesion and easy chewing/eating.  Less Distractions/Talking was encouraged/followed during the session/po intake -- pt requires verbal cues  for follow through during po intake/tasks and self-feeding also.          In setting of baseline Dementia and Cognitive decline, recommend continue w/ a fairly Regular diet for options of preference BUT Cut and Well-moistened foods for ease of mastication/oral phase clearing; thin liquids VIA CUP (no straws). Recommend general aspiration precautions; reduce Distractions/Talking during meals. Engage pt for self-feeding during meals. Give Time for oral clearing b/t bites. Pills Crushed vs Whole in Puree for safer swallowing -- Caregivers/Family reported pt has been "chewing" Pills at home, so Crushed may be best. Supervision w/ all oral intake d/t Cognitive decline. MD/NSG updated.    ST services recommends follow up w/ Dietician and Palliative Care for support and education re: impact of Cognitive decline/Dementia on oral intake/swallowing. Suspect pt is close to/at her baseline. Precautions posted in chart. No further Speech needs indicated, but MD to reconsult if new needs arise during admit.      HPI HPI: Pt is an 83 year old female with history of Dementia, atrial fibrillation on Eliquis, currently not on rate control, hypertension. hyperlipidemia, who presents emergency department for chief concerns of altered mental status.     Patient presented via EMS and per nursing documentation, patient received sodium chloride 500 mL liter bolus per EMS.   CXR at admit: Patchy left lower lobe opacity, question early pneumonia. Last CXR was in 2021.   Caregivers stated concern for "UTI" this admit along w/ pneumonia. Pt has Caregivers in the home.      SLP Plan  All goals met      Recommendations for follow up therapy are one component of a multi-disciplinary discharge planning process, led by the attending physician.  Recommendations may be updated based on patient status, additional functional criteria and insurance authorization.    Recommendations  Diet recommendations: Dysphagia 3 (mechanical  soft);Regular;Thin liquid (gravies) Liquids provided via: Cup;No straw Medication Administration: Crushed with puree (can try Whole if able to) Supervision: Staff to assist with self feeding;Full supervision/cueing for compensatory strategies Compensations: Minimize environmental distractions;Slow rate;Small sips/bites;Lingual sweep for clearance of pocketing;Multiple dry swallows after each bite/sip;Follow solids with liquid Postural Changes and/or Swallow Maneuvers: Out of bed for meals;Seated upright 90 degrees;Upright 30-60 min after meal                 (Palliative Care for GOC; Dietician) Oral care BID;Oral care before and after PO;Staff/trained caregiver to provide oral care   Frequent or constant Supervision/Assistance Dysphagia, unspecified (R13.10) (has Baseline Dementia - distracted easily)     All goals met       Jerilynn Som, MS, CCC-SLP Speech Language Pathologist Rehab Services; [facility] [phone] (ascom) Christine Johns  09/09/2023, 4:49 PM

## 2023-09-09 NOTE — Progress Notes (Addendum)
 Cardiology and Hospitalist have been notified: Rn has stopped the Cardizem drip for now since the pt's blood pressure dropped to 72/46 (MAP 55), P 60, SBP continues to be in the 80's currently. She is very drowsy. She did get Haldol this morning at 0850 as her daughter reported she kept being agitated. and Seroquel 50mg  BID order and given possible cause of her drowsiness. Current BP 83/48 (MAP 60), P 74.  MD has ordered 555m ML bolus of NS and it has been initiated.    09/09/23 1312 09/09/23 1313 09/09/23 1314  Vitals  BP (!) 72/46 (!) 82/45  --   MAP (mmHg) (!) 56 (!) 55  --   Pulse Rate 60 62 (!) 46  ECG Heart Rate 73 73 74  Resp 18 18 18   MEWS COLOR  MEWS Score Color Yellow Green Green  Oxygen Therapy  SpO2 96 % 96 % 96 %    09/09/23 1315 09/09/23 1316 09/09/23 1317  Vitals  BP (!) 81/50 (!) 89/51  --   MAP (mmHg) (!) 61 (!) 64  --   Pulse Rate 72 69 69  ECG Heart Rate 77 75 68  Resp (!) 21 20 20   MEWS COLOR  MEWS Score Color Yellow Green Green  Oxygen Therapy  SpO2 95 % 96 % 96 %    09/09/23 1320 09/09/23 1325 09/09/23 1330  Vitals  BP  --  (!) 83/48 (!) 87/49  MAP (mmHg)  --  (!) 60 (!) 61  Pulse Rate 74 68 66  ECG Heart Rate 69 69 71  Resp 19 20 19   MEWS COLOR  MEWS Score Color Riki Sheer Green  Oxygen Therapy  SpO2 95 % 95 % 95 %    09/09/23 1335 09/09/23 1340  Vitals  BP  --  (!) 96/49  MAP (mmHg)  --  (!) 63  Pulse Rate (!) 57 60  ECG Heart Rate 70 70  Resp 19 19  MEWS COLOR  MEWS Score Color Green Green  Oxygen Therapy  SpO2 94 % 95 %

## 2023-09-09 NOTE — Consult Note (Signed)
 Cardiology Consultation   Patient ID: Christine Johns MRN: [id number]; DOB: 1940-07-14  Admit date: 09/07/2023 Date of Consult: 09/09/2023  PCP:  Marisue Ivan, MD   [facility] Providers Cardiologist:  Carolan Clines, MD   Patient Profile:   Christine Johns is a 83 y.o. female with a hx of paroxysmal atrial fibrillation, hypertension, hyperlipidemia, chronic kidney disease, and dementia, who is being seen 09/09/2023 for the evaluation of atrial fibrillation with rapid ventricular response at the request of Dr. Lucianne Muss.  History of Present Illness:   Christine Johns is accompanied by her daughter and niece, who provided the majority of the history.  Christine Johns does not know where she is or why she is in the hospital.  She presented to the emergency department 2 days ago due to weakness and altered mental status.  She was found to be in atrial fibrillation with rapid ventricular response.  Chest radiograph showed multifocal infiltrates.  She was started on empiric antibiotics and given a fluid bolus.  Subsequently, diltiazem infusion has been added for rate control.  She has also received IV metoprolol for additional heart rate control, as she was intermittently requiring BiPAP due to respiratory failure.  At this time, Christine Johns is without complaints, denying chest pain, shortness of breath, and palpitations.  Christine Johns was hospitalized at [facility] in 04/2023 due to syncope.  She was found to be in atrial fibrillation with rapid ventricular response.  There, she converted to sinus rhythm and was noted to have frequent PACs while in sinus rhythm.  She was discharged on apixaban 2.5 mg twice daily.  She was not discharged on any AV nodal blocking agents.  Event monitor was ordered at the time of discharge but never completed.  She was scheduled for outpatient cardiology follow-up in [place], though this appointment was canceled.  She saw her PCP last week who was reportedly concerned that Ms.  Johns was in atrial fibrillation based on her daughter's report.  However, EKG documented in Dr. Sherryll Burger note mentions PACs and PVCs.  Holter monitor was placed, though Christine Johns's family reports that she ripped it off within 24 hours.   Past Medical History:  Diagnosis Date   Chronic kidney disease    Dementia (HCC)    Hyperlipidemia    Hypertension    Paroxysmal atrial fibrillation (HCC)     History reviewed. No pertinent surgical history.    Inpatient Medications: Scheduled Meds:  apixaban  2.5 mg Oral BID   calcium-vitamin D  2 tablet Oral Daily   Chlorhexidine Gluconate Cloth  6 each Topical Daily   cholecalciferol  1,000 Units Oral Daily   feeding supplement  237 mL Oral BID BM   ipratropium-albuterol  3 mL Nebulization QID   methylPREDNISolone (SOLU-MEDROL) injection  40 mg Intravenous BID   Followed by   Melene Muller ON 09/10/2023] predniSONE  40 mg Oral Q breakfast   metoprolol tartrate  2.5 mg Intravenous BID   multivitamin with minerals  1 tablet Oral Daily   [START ON 09/10/2023] pantoprazole  40 mg Oral Daily   potassium chloride  40 mEq Oral Once   pravastatin  40 mg Oral q1800   QUEtiapine  50 mg Oral BID   sodium bicarbonate  100 mEq Intravenous Once   Continuous Infusions:  azithromycin 500 mg (09/09/23 0939)   cefTRIAXone (ROCEPHIN)  IV 2 g (09/09/23 0935)   diltiazem (CARDIZEM) infusion 12.5 mg/hr (09/09/23 0927)   magnesium sulfate bolus IVPB  metronidazole Stopped (09/09/23 0314)   sodium PHOSPHATE IVPB (in mmol)     PRN Meds: acetaminophen **OR** acetaminophen, ondansetron **OR** ondansetron (ZOFRAN) IV, senna-docusate  Allergies:    Allergies  Allergen Reactions   Atorvastatin Other (See Comments)   Penicillins Itching and Swelling   Sulfa Antibiotics Itching and Swelling    Social History:   Social History   Tobacco Use   Smoking status: Never   Smokeless tobacco: Never  Substance Use Topics   Alcohol use: Never   Drug use: Never       Family History:   Family History  Problem Relation Age of Onset   Macular degeneration Brother    Breast cancer Neg Hx      ROS:  Unable to perform due to the patient's dementia.  Physical Exam/Data:   Vitals:   09/09/23 0930 09/09/23 0942 09/09/23 0945 09/09/23 1000  BP: 108/64  114/73 114/60  Pulse: (!) 50  95 94  Resp: (!) 21  16 (!) 27  Temp:      TempSrc:      SpO2: 100% 98% 94% 97%  Weight:      Height:        Intake/Output Summary (Last 24 hours) at 09/09/2023 1022 Last data filed at 09/09/2023 0927 Gross per 24 hour  Intake 1542.99 ml  Output 125 ml  Net 1417.99 ml      09/08/2023   10:00 PM 05/02/2023    4:27 AM 05/01/2023    1:42 PM  Last 3 Weights  Weight (lbs) 118 lb 2.7 oz 113 lb 1.5 oz 130 lb 1.1 oz  Weight (kg) 53.6 kg 51.3 kg 59 kg     Body mass index is 20.93 kg/m.  General: Thin, elderly woman seated in bed.  She is accompanied by her daughter and niece. HEENT: normal Neck: no JVD Vascular: No carotid bruits; 2+ radial pulses bilaterally. Cardiac: Irregularly irregular rhythm without murmurs. Lungs: Diminished breath sounds throughout without wheezes or crackles. Abd: soft, nontender, no hepatomegaly  Ext: no edema Musculoskeletal:  No deformities, BUE and BLE strength normal and equal Skin: warm and dry  Neuro:  CNs 2-12 intact, no focal abnormalities noted Psych:  Normal affect   EKG:  The EKG (09/07/2023) was personally reviewed and demonstrates:  Atrial fibrillation with rapid ventricular response, incomplete RBBB, and nonspecific ST/T changes. Telemetry:  Telemetry was personally reviewed and demonstrates: Predominantly sinus rhythm with frequent PACs, cannot exclude MAT.  Atrial fibrillation less likely since coming to ICU, though artifact limits evaluation.  Relevant CV Studies: TTE (05/02/2023):  1. Left ventricular ejection fraction, by estimation, is 55 to 60%. The  left ventricle has normal function. The left ventricle has no  regional  wall motion abnormalities. Left ventricular diastolic parameters are  consistent with Grade I diastolic  dysfunction (impaired relaxation).   2. Right ventricular systolic function is normal. The right ventricular  size is normal. There is normal pulmonary artery systolic pressure.   3. The mitral valve is grossly normal. Mild mitral valve regurgitation.   4. The aortic valve is tricuspid. Aortic valve regurgitation is not  visualized.   5. The inferior vena cava is normal in size with greater than 50%  respiratory variability, suggesting right atrial pressure of 3 mmHg.   Laboratory Data:  High Sensitivity Troponin:   Recent Labs  Lab 09/07/23 1053 09/08/23 0424  TROPONINIHS 65* 78*     Chemistry Recent Labs  Lab 09/07/23 1053 09/08/23 0424 09/08/23 1805 09/09/23 0344  NA 137 136 134* 132*  K 2.8* 2.9* 2.9* 3.6  CL 107 107 104 104  CO2 18* 22 18* 19*  GLUCOSE 109* 96 156* 175*  BUN 17 16 15 16   CREATININE 1.23* 1.13* 1.06* 1.10*  CALCIUM 8.0* 7.8* 8.0* 7.8*  MG 1.2* 1.7  --  1.3*  GFRNONAA 43* 48* 51* 49*  ANIONGAP 12 7 12 9     Recent Labs  Lab 09/07/23 1053  PROT 6.4*  ALBUMIN 3.1*  AST 27  ALT 12  ALKPHOS 42  BILITOT 0.8   Lipids No results for input(s): "CHOL", "TRIG", "HDL", "LABVLDL", "LDLCALC", "CHOLHDL" in the last 168 hours.  Hematology Recent Labs  Lab 09/07/23 1053 09/08/23 0424 09/09/23 0344  WBC 4.5 4.4 11.4*  RBC 3.64* 3.38* 3.57*  HGB 11.1* 10.2* 10.8*  HCT 34.1* 29.7* 30.8*  MCV 93.7 87.9 86.3  MCH 30.5 30.2 30.3  MCHC 32.6 34.3 35.1  RDW 14.0 13.9 13.5  PLT 127* 111* 123*   Thyroid  Recent Labs  Lab 09/07/23 1053  TSH 0.726  FREET4 0.85    BNPNo results for input(s): "BNP", "PROBNP" in the last 168 hours.  DDimer No results for input(s): "DDIMER" in the last 168 hours.   Radiology/Studies:  DG Chest Port 1 View Result Date: 09/08/2023 CLINICAL DATA:  Aspiration into airway. EXAM: PORTABLE CHEST 1 VIEW  COMPARISON:  Chest radiograph dated 09/07/2023. FINDINGS: Patient is rotated to the right. The heart size and mediastinal contours are unchanged. Increasing left basilar opacity. Right lung is clear. No sizable pleural effusion or pneumothorax. No acute osseous abnormality. IMPRESSION: Increasing left basilar opacity, which could reflect aspiration or worsening pneumonia. Electronically Signed   By: Hart Robinsons M.D.   On: 09/08/2023 15:28   CT Cervical Spine Wo Contrast Result Date: 09/07/2023 CLINICAL DATA:  Neck trauma.  Altered mental status.  Abnormal gait. EXAM: CT CERVICAL SPINE WITHOUT CONTRAST TECHNIQUE: Multidetector CT imaging of the cervical spine was performed without intravenous contrast. Multiplanar CT image reconstructions were also generated. RADIATION DOSE REDUCTION: This exam was performed according to the departmental dose-optimization program which includes automated exposure control, adjustment of the mA and/or kV according to patient size and/or use of iterative reconstruction technique. COMPARISON:  None Available. FINDINGS: Alignment: Slight degenerative anterolisthesis is present at C3-4 and C4-5. Reversal of the normal cervical lordosis is present. Skull base and vertebrae: The craniocervical junction is normal. The vertebral body heights are normal. No acute fractures are present. Prominent pannus formation is present at C1-2. Soft tissues and spinal canal: No prevertebral fluid or swelling. No visible canal hematoma. Disc levels: Uncovertebral spurring is worse left than right at C5-6 and C6-7 with left foraminal stenosis at both of these levels Upper chest: A left pleural effusion is present. Mild dependent atelectasis is present bilaterally. IMPRESSION: 1. No acute fracture or traumatic subluxation. 2. Degenerative changes of the cervical spine as described. 3. Left pleural effusion. Electronically Signed   By: Marin Roberts M.D.   On: 09/07/2023 13:18   CT HEAD WO  CONTRAST ( ) Result Date: 09/07/2023 CLINICAL DATA:  Head trauma, minor. Altered mental status. Weakness. Possible urinary tract infection. EXAM: CT HEAD WITHOUT CONTRAST TECHNIQUE: Contiguous axial images were obtained from the base of the skull through the vertex without intravenous contrast. RADIATION DOSE REDUCTION: This exam was performed according to the departmental dose-optimization program which includes automated exposure control, adjustment of the mA and/or kV according to patient size and/or use of iterative reconstruction technique. COMPARISON:  CT head without contrast 05/02/2023. FINDINGS: Brain: Advanced atrophy and white matter disease is similar the prior study. No acute or focal cortical abnormality is present. No acute hemorrhage or mass lesion is present. A remote lacunar infarct of the left basal ganglia is stable. Deep gray nuclei are otherwise within normal limits. The brainstem and cerebellum are within normal limits. Midline structures are within normal limits. Vascular: Atherosclerotic calcifications are present within the cavernous internal carotid arteries bilaterally. No hyperdense vessel is present. Skull: Calvarium is intact. No focal lytic or blastic lesions are present. No significant extracranial soft tissue lesion is present. Sinuses/Orbits: The paranasal sinuses and mastoid air cells are clear. Bilateral lens replacements are noted. Globes and orbits are otherwise unremarkable. IMPRESSION: 1. No acute intracranial abnormality or significant interval change. 2. Advanced atrophy and white matter disease is similar the prior study and likely reflects the sequela of chronic microvascular ischemia. 3. 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 Electronically Signed   By: Marin Roberts M.D.   On: 09/07/2023 13:15   DG Chest Port 1 View Result Date: 09/07/2023 CLINICAL DATA:  Altered mental status, weakness.  Suspected sepsis. EXAM: PORTABLE CHEST 1 VIEW COMPARISON:   03/27/2020 FINDINGS: Heart mediastinal contours within normal limits. Patchy left lower lobe airspace opacity could reflect early pneumonia. No confluent opacity on the right. No effusions or acute bony abnormality. IMPRESSION: Patchy left lower lobe opacity, question early pneumonia. Electronically Signed   By: Charlett Nose M.D.   On: 09/07/2023 12:12     Assessment and Plan:   Atrial fibrillation with rapid ventricular response and multifocal atrial tachycardia: Ms. Mcmurphy has a history of paroxysmal atrial fibrillation with admission EKG consistent with this.  However, more recent telemetry suggests sinus rhythm with frequent PACs as well as salvos of MAT.  This is not unexpected in the setting of her acute respiratory illness.  I think it is reasonable to continue apixaban 2.5 mg twice daily, as hemoglobin is stable and there is no evidence of active bleeding.  I hope that her supraventricular arrhythmias will subside as her respiratory illness improves and corticosteroids can be weaned.  As her rate control seems reasonable at this time, I will plan to transition from IV diltiazem and metoprolol to oral metoprolol.  Repeat echocardiogram is currently pending.  Continue repletion of electrolytes for target potassium and magnesium greater than 4.0 and 2.0, respectively.  Acute respiratory failure with hypoxia: I suspect this is most likely related to patchy lung opacities consistent with infectious pneumonitis.  Ms. Okray does not appear volume overloaded.  Follow-up echocardiogram is currently pending.  Continue antimicrobial therapy and corticosteroids per primary team.  Elevated troponin: Mildly elevated troponin noted.  Admission EKG in the setting of atrial fibrillation with rapid ventricular response shows incomplete RBBB with nonspecific ST/T changes.  Ms. Doorn does not report any angina, though her underlying dementia makes it challenging to obtain a thorough history.  Unless echo shows a new  cardiomyopathy or obvious wall motion abnormality, I would not pursue ischemia testing as minimal troponin elevation most likely represents supply-demand mismatch.  Chronic kidney disease stage 3a: Creatinine stable at this time.  Maintain net even fluid balance with avoidance of nephrotoxic agents.   Risk Assessment/Risk Scores:      CHA2DS2-VASc Score = 4   This indicates a 4.8% annual risk of stroke. The patient's score is based upon: CHF History: 0 HTN History: 1 Diabetes History: 0 Stroke History: 0 Vascular Disease History: 0  Age Score: 2 Gender Score: 1  For questions or updates, please contact [facility] Please consult [email or web address] for contact info under [facility].  Signed, Yvonne Kendall, MD  09/09/2023 10:22 AM

## 2023-09-09 NOTE — Progress Notes (Signed)
 [facility] Progress Note  Patient: Christine Johns    ZHY:[id number]  DOA: 09/07/2023     Date of Service: the patient was seen and examined on 09/09/2023  Chief Complaint  Patient presents with   Altered Mental Status   Brief hospital course: Ms. Christine Johns is an 83 year old female with history of atrial fibrillation on Eliquis, currently not on rate control, hypertension, dementia, hyperlipidemia, who presents emergency department for chief concerns of altered mental status.   Patient presented via EMS and per nursing documentation, patient received sodium chloride 500 mL liter bolus per EMS.   Vitals in the ED showed temperature 99, respiration rate 29, improved to 22, heart rate initially 50, currently 80, and on EKG showed atrial fibrillation with rate of 129, QTc 552.  Blood pressure is 159/98, SpO2 95% on room air.   Serum sodium is 135, potassium 2.8, chloride 107, bicarb 18, BUN of 17, serum creatinine 1.27, EGFR 43, nonfasting blood glucose 109, WBC 4.5, hemoglobin 11.2, platelets of 127.   High-sensitivity troponin 65.  Lactic acid 1.6.  Magnesium 1.2.  COVID/influenza A/influenza B/RSV PCR were negative.   Blood cultures x 2 are in process.   ED treatment: Doxycycline 100 mg p.o. one-time dose, diltiazem 10 mg x 2 doses, potassium chloride 40 mEq p.o. one-time dose, ceftriaxone 2 g IV one-time dose, magnesium 2 g IV one-time dose, sodium chloride 1 L bolus, diltiazem gtt. initiated.   Assessment and Plan:  # Multilobar pneumonia due to metapneumovirus Azithromycin 500 mg IV daily to complete a 5-day course, ceftriaxone 2 g IV daily to complete a 5-day course,  S/p NS 1 L bolus ordered by EDP and LR 500 mL bolus on admission Incentive spirometry, flutter valve Oxygen supplementation as needed to maintain SpO2 greater than 92% 4/7 had an episode of aspiration and hypoxia, suction was done, hypoxia improved, patient did not require intubation. Added Flagyl to cover  anaerobes, patient is allergic to penicillin RVP positive for metapneumovirus, continue droplet precautions Started Solu-Medrol 40 mg IV twice daily x 3 doses followed by prednisone 40 mg p.o. daily for 4 days  # Atrial fibrillation with RVR   Home Eliquis resumed on admission Patient currently not on a rate control medication S/p diltiazem GGT DC'd on 4/8 S/p Lopressor 10 mg IV 1 dose given, s/p Lopressor 50 BID Started Lopressor 12.5 mg p.o. Q6 hourly on 4/8 as per cardio 4/8 BP soft, cardiology recommended NS 500 mL bolus Cardiology consulted  Elevated troponin Suspect secondary to demand ischemia in setting of atrial fibrillation with RVR Continue to monitor  Follow TTE Follow cardiology   # Acute gastroenteritis Check C. difficile and GI pathogen Monitor electrolytes   # Metabolic acidosis Repleted bicarb IV Monitor electrolytes and replete as needed  Stage 3b chronic kidney disease (HCC) At baseline   Mixed hyperlipidemia Lovastatin equivalent, pravastatin 40 mg nightly ordered   History of normocytic normochromic anemia At baseline   Essential hypertension Home lisinopril 10 mg pod, Dc'd 4/7 s/p Lopressor 50 mg p.o. BID due to A-fib with RVR 4/8 started Lopressor 12.5 mg p.o. q. 6 hourly as per cardio due to soft BP  # Hyponatremia, monitor sodium level daily # Hypophosphatemia, Phos repleted. # Hypomagnesemia Status magnesium 2 g IV ordered by EDP Recheck magnesium level in the morning   # Acute hypokalemia due to diarrhea Status post potassium chloride 40 mEq p.o. one-time dose, potassium chloride 10 mill equivalent, 2 doses ordered by EDP LR 500 mL  liter bolus on admission Monitor electrolytes 4/7 potassium 2.9, repleted IV  Acute delirium,  s/p Seroquel 50 mg x 1 dose given 4/8, patient was sleepy so decreased dose of Seroquel 25 mg p.o. twice daily   Body mass index is 20.93 kg/m.  Interventions:  Diet: Regular diet DVT Prophylaxis:  Therapeutic Anticoagulation with Eliquis    Advance goals of care discussion: Full code  Family Communication: family was present at bedside, at the time of interview.  The pt provided permission to discuss medical plan with the family. Opportunity was given to ask question and all questions were answered satisfactorily.  Patient has dementia AO x 1 4/8 discussed with patient's daughter at bedside.   Disposition:  Pt is from home, admitted with respiratory failure due to pneumonia, A-fib with RVR, diarrhea, hypokalemia, still has respiratory failure and A-fib with RVR, and electrolyte imbalance, which precludes a safe discharge. Discharge to home, when stable, may need few days to improve.  Subjective: No significant events overnight, patient still has shortness of breath and A-fib with RVR, patient has significant dementia, unable to offer any complaints.   Physical Exam: General: Mild respiratory distress, lying comfortably Appear in mild resp distress, affect flat Eyes: PERRLA ENT: Oral Mucosa Clear, moist  Neck: no JVD,  Cardiovascular: Irregular rhythm, no Murmur,  Respiratory: Equal air entry bilaterally, Mild crackles bilaterally and mild wheezes  Abdomen: Bowel Sound present, Soft and no tenderness,  Skin: no rashes Extremities: no Pedal edema, no calf tenderness Neurologic: without any new focal findings Gait not checked due to patient safety concerns  Vitals:   09/09/23 1405 09/09/23 1415 09/09/23 1430 09/09/23 1445  BP:  (!) 92/50 (!) 96/54 118/68  Pulse: 64 65 67 (!) 56  Resp: 20 19 20 16   Temp:      TempSrc:      SpO2: 95% 94% 95% 97%  Weight:      Height:        Intake/Output Summary (Last 24 hours) at 09/09/2023 1630 Last data filed at 09/09/2023 1453 Gross per 24 hour  Intake 2205.08 ml  Output 250 ml  Net 1955.08 ml   Filed Weights   09/08/23 2200  Weight: 53.6 kg    Data Reviewed: I have personally reviewed and interpreted daily labs, tele strips,  imagings as discussed above. I reviewed all nursing notes, pharmacy notes, vitals, pertinent old records I have discussed plan of care as described above with RN and patient/family.  CBC: Recent Labs  Lab 09/07/23 1053 09/08/23 0424 09/09/23 0344  WBC 4.5 4.4 11.4*  NEUTROABS 3.3  --   --   HGB 11.1* 10.2* 10.8*  HCT 34.1* 29.7* 30.8*  MCV 93.7 87.9 86.3  PLT 127* 111* 123*   Basic Metabolic Panel: Recent Labs  Lab 09/07/23 1053 09/08/23 0424 09/08/23 1805 09/09/23 0344  NA 137 136 134* 132*  K 2.8* 2.9* 2.9* 3.6  CL 107 107 104 104  CO2 18* 22 18* 19*  GLUCOSE 109* 96 156* 175*  BUN 17 16 15 16   CREATININE 1.23* 1.13* 1.06* 1.10*  CALCIUM 8.0* 7.8* 8.0* 7.8*  MG 1.2* 1.7  --  1.3*  PHOS  --  2.5  --  2.0*    Studies: No results found.   Scheduled Meds:  apixaban  2.5 mg Oral BID   calcium-vitamin D  2 tablet Oral Daily   Chlorhexidine Gluconate Cloth  6 each Topical Daily   cholecalciferol  1,000 Units Oral Daily   feeding  supplement  237 mL Oral TID BM   ipratropium-albuterol  3 mL Nebulization TID   methylPREDNISolone (SOLU-MEDROL) injection  40 mg Intravenous BID   Followed by   Melene Muller ON 09/10/2023] predniSONE  40 mg Oral Q breakfast   metoprolol tartrate  12.5 mg Oral Q6H   multivitamin with minerals  1 tablet Oral Daily   [START ON 09/10/2023] pantoprazole  40 mg Oral Daily   pravastatin  40 mg Oral q1800   QUEtiapine  25 mg Oral BID   Continuous Infusions:  azithromycin Stopped (09/09/23 1045)   cefTRIAXone (ROCEPHIN)  IV Stopped (09/09/23 1005)   metronidazole Stopped (09/09/23 1328)   sodium PHOSPHATE IVPB (in mmol) 43 mL/hr at 09/09/23 1453   PRN Meds: acetaminophen **OR** acetaminophen, ondansetron **OR** ondansetron (ZOFRAN) IV, senna-docusate  Time spent: 55 minutes  Author: Gillis Santa. MD [facility] 09/09/2023 4:30 PM  To reach On-call, see care teams to locate the attending and reach out to them via [email or web address]. If 7PM-7AM,  please contact night-coverage If you still have difficulty reaching the attending provider, please page the [facility] (Director on Call) for [facility] on amion for assistance.

## 2023-09-09 NOTE — Progress Notes (Signed)
 Initial Nutrition Assessment  DOCUMENTATION CODES:   Non-severe (moderate) malnutrition in context of social or environmental circumstances  INTERVENTION:   Ensure Enlive po TID, each supplement provides 350 kcal and 20 grams of protein.  Magic cup TID with meals, each supplement provides 290 kcal and 9 grams of protein  MVI po daily   Pt at high refeed risk; recommend monitor potassium, magnesium and phosphorus labs daily until stable  Daily weights   NUTRITION DIAGNOSIS:   Moderate Malnutrition related to social / environmental circumstances as evidenced by moderate fat depletion, moderate muscle depletion.  GOAL:   Patient will meet greater than or equal to 90% of their needs  MONITOR:   PO intake, Supplement acceptance, Labs, Weight trends, I & O's, Skin  REASON FOR ASSESSMENT:   Malnutrition Screening Tool    ASSESSMENT:   83 y/o female with history of atrial fibrillation on Eliquis, currently not on rate control, hypertension, dementia, hyperlipidemia and CKD III who is admitted with metapneumovirus and PNA.  Met with pt and family in room today. Pt is a poor historian so history obtained by pt's daughter at bedside. Pt reports that she is feeling ok today. When asked if she is hungry, pt reports "not yet". Family reports pt with poor appetite and oral intake at baseline; pt has days when she eats better than others. Pt does drink vanilla Ensure at home. Family reports some weight loss over the past month. Pt seen by SLP today and placed on a mechanical soft diet. RD will add supplements and MVI to help pt meet her estimated needs. Pt is actively refeeding; electrolytes being monitored and supplemented. Per chart, pt appears weight stable for the past 6 months.    Medications reviewed and include: oscal with D, D3, solu-medrol/prednisone, protonix, azithromycin, ceftriaxone, Mg sulfate, metronidazole, Na phosphate    Labs reviewed: Na 132(L), K 3.6 wnl, creat 1.10(H),  P 2.0(L), Mg 1.3(L) Wbc- 11.4(H), Hgb 10.8(L), Hct 30.8(L) Cbgs- 160, 168, 177 x 24 hrs   NUTRITION - FOCUSED PHYSICAL EXAM:  Flowsheet Row Most Recent Value  Orbital Region Mild depletion  Upper Arm Region Moderate depletion  Thoracic and Lumbar Region Moderate depletion  Buccal Region No depletion  Temple Region Moderate depletion  Clavicle Bone Region Moderate depletion  Clavicle and Acromion Bone Region Moderate depletion  Scapular Bone Region Moderate depletion  Dorsal Hand Moderate depletion  Patellar Region Moderate depletion  Anterior Thigh Region Moderate depletion  Posterior Calf Region Moderate depletion  Edema (RD Assessment) None  Hair Reviewed  Eyes Reviewed  Mouth Reviewed  Skin Reviewed  Nails Reviewed   Diet Order:   Diet Order             Diet regular Room service appropriate? Yes with Assist; Fluid consistency: Thin  Diet effective now                  EDUCATION NEEDS:   Education needs have been addressed  Skin:  Skin Assessment: Reviewed RN Assessment (ecchymosis)  Last BM:  4/7  Height:   Ht Readings from Last 1 Encounters:  09/08/23 5\' 3"  (1.6 m)    Weight:   Wt Readings from Last 1 Encounters:  09/08/23 53.6 kg    Ideal Body Weight:  52.2 kg  BMI:  Body mass index is 20.93 kg/m.  Estimated Nutritional Needs:   Kcal:  1400-1600kcal/day  Protein:  70-80g/day  Fluid:  1.4-1.6L/day  Christine Holiday MS, RD, LDN If unable to be reached,  please send secure chat to "RD inpatient" available from 8:00a-4:00p daily

## 2023-09-09 NOTE — Plan of Care (Signed)

## 2023-09-09 NOTE — Consult Note (Signed)
 PHARMACY CONSULT NOTE - ELECTROLYTES  Pharmacy Consult for Electrolyte Monitoring and Replacement   Recent Labs: Height: 5\' 3"  (160 cm) Weight: 53.6 kg (118 lb 2.7 oz) IBW/kg (Calculated) : 52.4 Estimated Creatinine Clearance: 30.9 mL/min (A) (by C-G formula based on SCr of 1.1 mg/dL (H)). Potassium (mmol/L)  Date Value  09/09/2023 3.6   Magnesium (mg/dL)  Date Value  16/03/9603 1.3 (L)   Calcium (mg/dL)  Date Value  54/02/8118 7.8 (L)   Albumin (g/dL)  Date Value  14/78/2956 3.1 (L)   Phosphorus (mg/dL)  Date Value  21/30/8657 2.0 (L)   Sodium (mmol/L)  Date Value  09/09/2023 132 (L)   Corrected Ca: 8.52 mg/dL  Assessment  Christine Johns is a 83 y.o. female presenting with altered mental status. PMH significant for atrial fibrillation on Eliquis, currently not on rate control, hypertension, dementia, hyperlipidemia. Pharmacy has been consulted to monitor and replace electrolytes.  Goal of Therapy: Electrolytes WNL  Plan:  Phos 2.0, Na 132: NaPhos IV x 1  Mg 1.3: Mag sulfate 4g IV x 1 Check BMP, Mg, Phos with AM labs  Thank you for allowing pharmacy to be a part of this patient's care.  Bettey Costa, PharmD Clinical Pharmacist 09/09/2023 11:40 AM

## 2023-09-09 NOTE — Progress Notes (Addendum)
 OT Cancellation Note  Patient Details Name: Christine Johns MRN: [id number] DOB: 09/24/1940   Cancelled Treatment:    Reason Eval/Treat Not Completed: Medical issues which prohibited therapy Orders received and chart reviewed. Discussed with PT - pt is transitioning off BiPAP, RN requests hold to further assess respiratory status on RA prior to therapy eval. Will re-attempt at later time.    Addendum: Evaluation re-attempted. Patient lethargic and unable to maintain alertness for evaluation this PM. OT will continue efforts next date as medically appropriate.   [name] L. Shakeisha Horine, OTR/L  09/09/23, 10:29 AM

## 2023-09-09 NOTE — Progress Notes (Signed)
 The patient is currently alert and oriented x1 and is interacting with family. Current heart rater is maintaining in the 80's and 90's at this time. Foley catheter has been inserted.

## 2023-09-09 NOTE — Progress Notes (Signed)
 PT Cancellation Note  Patient Details Name: Christine Johns MRN: [id number] DOB: 03-25-1941   Cancelled Treatment:    Reason Eval/Treat Not Completed:  (Evaluation re-attempted.  Patient lethargic and unable to maintain alertness for evaluation this PM (s/p haldol and seroquel).  Will continue efforts next date as medically appropriate.)   Fowler Antos H. Manson Passey, PT, DPT, NCS 09/09/23, 1:27 PM [phone]

## 2023-09-10 DIAGNOSIS — J9601 Acute respiratory failure with hypoxia: Secondary | ICD-10-CM

## 2023-09-10 DIAGNOSIS — I4719 Other supraventricular tachycardia: Secondary | ICD-10-CM

## 2023-09-10 DIAGNOSIS — J189 Pneumonia, unspecified organism: Secondary | ICD-10-CM

## 2023-09-10 DIAGNOSIS — I429 Cardiomyopathy, unspecified: Secondary | ICD-10-CM

## 2023-09-10 DIAGNOSIS — R918 Other nonspecific abnormal finding of lung field: Secondary | ICD-10-CM | POA: Diagnosis not present

## 2023-09-10 LAB — ECHOCARDIOGRAM COMPLETE
AR max vel: 1.22 cm2
AV Area VTI: 1.02 cm2
AV Area mean vel: 1.11 cm2
AV Mean grad: 5.5 mmHg
AV Peak grad: 8.6 mmHg
Ao pk vel: 1.46 m/s
Area-P 1/2: 3.32 cm2
Calc EF: 42.3 %
Height: 63 in
S' Lateral: 3.2 cm
Single Plane A2C EF: 38 %
Single Plane A4C EF: 45.8 %
Weight: 1890.66 [oz_av]

## 2023-09-10 LAB — CBC
HCT: 25.1 % — ABNORMAL LOW (ref 36.0–46.0)
Hemoglobin: 8.9 g/dL — ABNORMAL LOW (ref 12.0–15.0)
MCH: 30.1 pg (ref 26.0–34.0)
MCHC: 35.5 g/dL (ref 30.0–36.0)
MCV: 84.8 fL (ref 80.0–100.0)
Platelets: 118 10*3/uL — ABNORMAL LOW (ref 150–400)
RBC: 2.96 MIL/uL — ABNORMAL LOW (ref 3.87–5.11)
RDW: 13.8 % (ref 11.5–15.5)
WBC: 8.6 10*3/uL (ref 4.0–10.5)
nRBC: 0 % (ref 0.0–0.2)

## 2023-09-10 LAB — MAGNESIUM: Magnesium: 2.6 mg/dL — ABNORMAL HIGH (ref 1.7–2.4)

## 2023-09-10 LAB — BASIC METABOLIC PANEL WITH GFR
Anion gap: 7 (ref 5–15)
BUN: 21 mg/dL (ref 8–23)
CO2: 23 mmol/L (ref 22–32)
Calcium: 7.6 mg/dL — ABNORMAL LOW (ref 8.9–10.3)
Chloride: 106 mmol/L (ref 98–111)
Creatinine, Ser: 1.13 mg/dL — ABNORMAL HIGH (ref 0.44–1.00)
GFR, Estimated: 48 mL/min — ABNORMAL LOW (ref >60)
Glucose, Bld: 164 mg/dL — ABNORMAL HIGH (ref 70–99)
Potassium: 3.3 mmol/L — ABNORMAL LOW (ref 3.5–5.1)
Sodium: 136 mmol/L (ref 135–145)

## 2023-09-10 LAB — VITAMIN D 25 HYDROXY (VIT D DEFICIENCY, FRACTURES): Vit D, 25-Hydroxy: 58.01 ng/mL (ref 30–100)

## 2023-09-10 LAB — PHOSPHORUS: Phosphorus: 2.8 mg/dL (ref 2.5–4.6)

## 2023-09-10 LAB — GLUCOSE, CAPILLARY
Glucose-Capillary: 181 mg/dL — ABNORMAL HIGH (ref 70–99)
Glucose-Capillary: 180 mg/dL — ABNORMAL HIGH (ref 70–99)

## 2023-09-10 MED ORDER — FUROSEMIDE 10 MG/ML IJ SOLN
20.0000 mg | Freq: Once | INTRAMUSCULAR | Status: AC
  Administered 2023-09-10: 20 mg via INTRAVENOUS
  Filled 2023-09-10: qty 2

## 2023-09-10 MED ORDER — QUETIAPINE FUMARATE 25 MG PO TABS
12.5000 mg | ORAL_TABLET | Freq: Every evening | ORAL | Status: DC
  Administered 2023-09-10 – 2023-09-14 (×5): 12.5 mg via ORAL
  Filled 2023-09-10 (×7): qty 1

## 2023-09-10 MED ORDER — MELATONIN 5 MG PO TABS
2.5000 mg | ORAL_TABLET | Freq: Once | ORAL | Status: AC
  Administered 2023-09-10: 2.5 mg via ORAL
  Filled 2023-09-10: qty 1

## 2023-09-10 MED ORDER — GUAIFENESIN ER 600 MG PO TB12
600.0000 mg | ORAL_TABLET | Freq: Two times a day (BID) | ORAL | Status: DC
  Administered 2023-09-10 – 2023-09-16 (×13): 600 mg via ORAL
  Filled 2023-09-10 (×13): qty 1

## 2023-09-10 MED ORDER — METOPROLOL TARTRATE 25 MG PO TABS
25.0000 mg | ORAL_TABLET | Freq: Once | ORAL | Status: AC
  Administered 2023-09-10: 25 mg via ORAL
  Filled 2023-09-10: qty 1

## 2023-09-10 MED ORDER — ORAL CARE MOUTH RINSE: 15.0000 mL | OROMUCOSAL | Status: DC | PRN

## 2023-09-10 MED ORDER — METOPROLOL TARTRATE 50 MG PO TABS
50.0000 mg | ORAL_TABLET | Freq: Two times a day (BID) | ORAL | Status: DC
  Administered 2023-09-10 – 2023-09-16 (×11): 50 mg via ORAL
  Filled 2023-09-10 (×11): qty 1

## 2023-09-10 MED ORDER — POTASSIUM CHLORIDE CRYS ER 20 MEQ PO TBCR
40.0000 meq | EXTENDED_RELEASE_TABLET | Freq: Once | ORAL | Status: AC
  Administered 2023-09-10: 40 meq via ORAL
  Filled 2023-09-10: qty 2

## 2023-09-10 MED ORDER — METOPROLOL TARTRATE 25 MG PO TABS: 25.0000 mg | ORAL_TABLET | Freq: Two times a day (BID) | ORAL | Status: DC

## 2023-09-10 NOTE — Care Management Important Message (Signed)
 Important Message  Patient Details  Name: Christine Johns MRN: [id number] Date of Birth: [birth year]   Important Message Given:        Marcell Anger 09/10/2023, 4:12 PM

## 2023-09-10 NOTE — Progress Notes (Addendum)
 Pt is pulling IV and daughter notified staff. When staff came at bedside pt was pulling IV. Daughter is requesting something for pt to help with sleep. NP Jon Billings made aware. Will continue to monitor.  Update 2326: See new orders. Will continue to monitor.

## 2023-09-10 NOTE — Inpatient Diabetes Management (Signed)
 Inpatient Diabetes Program Recommendations  AACE/ADA: New Consensus Statement on Inpatient Glycemic Control  Target Ranges:  Prepandial:   less than 140 mg/dL      Peak postprandial:   less than 180 mg/dL (1-2 hours)      Critically ill patients:  140 - 180 mg/dL    Latest Reference Range & Units 09/09/23 07:47 09/09/23 11:29 09/09/23 16:19 09/09/23 19:36 09/09/23 23:34 09/10/23 03:48  Glucose-Capillary 70 - 99 mg/dL 629 (H) 528 (H) 413 (H) 209 (H) 180 (H) 181 (H)   Review of Glycemic Control  Diabetes history: No Outpatient Diabetes medications: NA Current orders for Inpatient glycemic control: Prednisone 40 mg QAM  Inpatient Diabetes Program Recommendations:    Insulin: If steroids are continued, may want to consider ordering CBGs AC&HS and Novolog 0-6 units TID with meals.  Thanks, Orlando Penner, RN, MSN, CDCES Diabetes Coordinator Inpatient Diabetes Program [phone] (Team Pager from 8am to 5pm)

## 2023-09-10 NOTE — Consult Note (Signed)
 PHARMACY CONSULT NOTE - ELECTROLYTES  Pharmacy Consult for Electrolyte Monitoring and Replacement   Recent Labs: Height: 5\' 3"  (160 cm) Weight: 53.6 kg (118 lb 2.7 oz) IBW/kg (Calculated) : 52.4 Estimated Creatinine Clearance: 30.1 mL/min (A) (by C-G formula based on SCr of 1.13 mg/dL (H)). Potassium (mmol/L)  Date Value  09/10/2023 3.3 (L)   Magnesium (mg/dL)  Date Value  13/01/6577 2.6 (H)   Calcium (mg/dL)  Date Value  46/96/2952 7.6 (L)   Albumin (g/dL)  Date Value  84/13/2440 3.1 (L)   Phosphorus (mg/dL)  Date Value  03/30/2535 2.8   Sodium (mmol/L)  Date Value  09/10/2023 136   Corrected Ca: 8.52 mg/dL  Assessment  Christine Johns is a 83 y.o. female presenting with altered mental status. PMH significant for atrial fibrillation on Eliquis, currently not on rate control, hypertension, dementia, hyperlipidemia. Pharmacy has been consulted to monitor and replace electrolytes.  Goal of Therapy: Electrolytes WNL  Plan:  K 3.3: Kcl x 1 Check BMP, Mg, Phos with AM labs  Thank you for allowing pharmacy to be a part of this patient's care.  Bettey Costa, PharmD Clinical Pharmacist 09/10/2023 11:30 AM

## 2023-09-10 NOTE — Plan of Care (Signed)
  Problem: Coping: Goal: Level of anxiety will decrease Outcome: Progressing   Problem: Elimination: Goal: Will not experience complications related to urinary retention Outcome: Not Progressing   Problem: Pain Managment: Goal: General experience of comfort will improve and/or be controlled Outcome: Progressing   Problem: Safety: Goal: Ability to remain free from injury will improve Outcome: Progressing

## 2023-09-10 NOTE — Progress Notes (Signed)
 Rounding Note    Patient Name: Christine Johns Date of Encounter: 09/10/2023  [facility] Cardiologist: Yvonne Kendall, MD   Subjective   Patient reports feeling better today with some ongoing shortness of breath. BP trending up with HR largely controlled in the 80s bpm. K 3.3. Kidney function stable.   Inpatient Medications    Scheduled Meds:  apixaban  2.5 mg Oral BID   calcium-vitamin D  2 tablet Oral Daily   Chlorhexidine Gluconate Cloth  6 each Topical Daily   cholecalciferol  1,000 Units Oral Daily   feeding supplement  237 mL Oral TID BM   guaiFENesin  600 mg Oral BID   ipratropium-albuterol  3 mL Nebulization TID   metoprolol tartrate  25 mg Oral BID   multivitamin with minerals  1 tablet Oral Daily   pantoprazole  40 mg Oral Daily   potassium chloride  40 mEq Oral Once   pravastatin  40 mg Oral q1800   predniSONE  40 mg Oral Q breakfast   QUEtiapine  12.5 mg Oral BID   Continuous Infusions:  azithromycin Stopped (09/09/23 1045)   cefTRIAXone (ROCEPHIN)  IV Stopped (09/09/23 1005)   metronidazole 500 mg (09/10/23 0132)   PRN Meds: acetaminophen **OR** acetaminophen, ondansetron **OR** ondansetron (ZOFRAN) IV, mouth rinse, senna-docusate   Vital Signs    Vitals:   09/10/23 0600 09/10/23 0700 09/10/23 0800 09/10/23 0801  BP: 112/66 131/75 126/70   Pulse: 70 82 (!) 39   Resp: 16 16 15    Temp:    (!) 97.4 F (36.3 C)  TempSrc:    Axillary  SpO2: 97% 97% 97%   Weight:      Height:        Intake/Output Summary (Last 24 hours) at 09/10/2023 1019 Last data filed at 09/10/2023 0800 Gross per 24 hour  Intake 1171.72 ml  Output 1300 ml  Net -128.28 ml      09/08/2023   10:00 PM 05/02/2023    4:27 AM 05/01/2023    1:42 PM  Last 3 Weights  Weight (lbs) 118 lb 2.7 oz 113 lb 1.5 oz 130 lb 1.1 oz  Weight (kg) 53.6 kg 51.3 kg 59 kg      Telemetry    Sinus rhythm with frequent PACs vs MAT - Personally Reviewed  Physical Exam   GEN: No acute  distress.   Neck: No JVD Cardiac: RRR, no murmurs, rubs, or gallops.  Respiratory: Scattered wheezing and crackles GI: Soft, nontender, non-distended  MS: No edema; No deformity. Neuro:  Nonfocal  Psych: Normal affect   Labs    High Sensitivity Troponin:   Recent Labs  Lab 09/07/23 1053 09/08/23 0424  TROPONINIHS 65* 78*     Chemistry Recent Labs  Lab 09/07/23 1053 09/08/23 0424 09/09/23 0344 09/09/23 1830 09/10/23 0415  NA 137   < > 132* 138 136  K 2.8*   < > 3.6 3.4* 3.3*  CL 107   < > 104 106 106  CO2 18*   < > 19* 20* 23  GLUCOSE 109*   < > 175* 218* 164*  BUN 17   < > 16 18 21   CREATININE 1.23*   < > 1.10* 1.24* 1.13*  CALCIUM 8.0*   < > 7.8* 7.8* 7.6*  MG 1.2*   < > 1.3* 2.9* 2.6*  PROT 6.4*  --   --   --   --   ALBUMIN 3.1*  --   --   --   --  AST 27  --   --   --   --   ALT 12  --   --   --   --   ALKPHOS 42  --   --   --   --   BILITOT 0.8  --   --   --   --   GFRNONAA 43*   < > 49* 43* 48*  ANIONGAP 12   < > 9 12 7    < > = values in this interval not displayed.    Lipids No results for input(s): "CHOL", "TRIG", "HDL", "LABVLDL", "LDLCALC", "CHOLHDL" in the last 168 hours.  Hematology Recent Labs  Lab 09/08/23 0424 09/09/23 0344 09/10/23 0415  WBC 4.4 11.4* 8.6  RBC 3.38* 3.57* 2.96*  HGB 10.2* 10.8* 8.9*  HCT 29.7* 30.8* 25.1*  MCV 87.9 86.3 84.8  MCH 30.2 30.3 30.1  MCHC 34.3 35.1 35.5  RDW 13.9 13.5 13.8  PLT 111* 123* 118*   Thyroid  Recent Labs  Lab 09/07/23 1053  TSH 0.726  FREET4 0.85    BNPNo results for input(s): "BNP", "PROBNP" in the last 168 hours.  DDimer No results for input(s): "DDIMER" in the last 168 hours.   Radiology    ECHOCARDIOGRAM COMPLETE Result Date: 09/10/2023 IMPRESSIONS  1. Left ventricular ejection fraction, by estimation, is 45 to 50%. The left ventricle has mildly decreased function. The left ventricle demonstrates global hypokinesis. There is mild asymmetric left ventricular hypertrophy of the  basal-septal segment. Left ventricular diastolic parameters are consistent with Grade II diastolic dysfunction (pseudonormalization). Elevated left atrial pressure.  2. Right ventricular systolic function is normal. The right ventricular size is normal.  3. Left atrial size was mildly dilated.  4. The mitral valve is degenerative. Moderate mitral valve regurgitation. No evidence of mitral stenosis.  5. The aortic valve is tricuspid. There is mild thickening of the aortic valve. Aortic valve regurgitation is not visualized. Aortic valve sclerosis is present, with no evidence of aortic valve stenosis.  6. The inferior vena cava is dilated in size with <50% respiratory variability, suggesting right atrial pressure of 15 mmHg.Electronically signed by Yvonne Kendall MD Signature Date/Time: 09/10/2023/8:55:54 AM    Final    DG Chest Port 1 View Result Date: 09/08/2023 IMPRESSION: Increasing left basilar opacity, which could reflect aspiration or worsening pneumonia. Electronically Signed   By: Hart Robinsons M.D.   On: 09/08/2023 15:28    Cardiac Studies   09/09/2023 Echo complete 1. Left ventricular ejection fraction, by estimation, is 45 to 50%. The  left ventricle has mildly decreased function. The left ventricle  demonstrates global hypokinesis. There is mild asymmetric left ventricular  hypertrophy of the basal-septal segment.  Left ventricular diastolic parameters are consistent with Grade II  diastolic dysfunction (pseudonormalization). Elevated left atrial  pressure.   2. Right ventricular systolic function is normal. The right ventricular  size is normal.   3. Left atrial size was mildly dilated.   4. The mitral valve is degenerative. Moderate mitral valve regurgitation.  No evidence of mitral stenosis.   5. The aortic valve is tricuspid. There is mild thickening of the aortic  valve. Aortic valve regurgitation is not visualized. Aortic valve  sclerosis is present, with no evidence of aortic  valve stenosis.   6. The inferior vena cava is dilated in size with <50% respiratory  variability, suggesting right atrial pressure of 15 mmHg.   05/02/2023 Echo complete 1. Left ventricular ejection fraction, by estimation, is 55 to  60%. The  left ventricle has normal function. The left ventricle has no regional  wall motion abnormalities. Left ventricular diastolic parameters are  consistent with Grade I diastolic  dysfunction (impaired relaxation).   2. Right ventricular systolic function is normal. The right ventricular  size is normal. There is normal pulmonary artery systolic pressure.   3. The mitral valve is grossly normal. Mild mitral valve regurgitation.   4. The aortic valve is tricuspid. Aortic valve regurgitation is not  visualized.   5. The inferior vena cava is normal in size with greater than 50%  respiratory variability, suggesting right atrial pressure of 3 mmHg.   Patient Profile     LARRISSA STIVERS is a 83 y.o. female with a hx of paroxysmal atrial fibrillation, hypertension, hyperlipidemia, chronic kidney disease, and dementia, who is being seen for the continued evaluation of arrhythmia.   Assessment & Plan    Atrial fibrillation with RVR Multifocal atrial tachycardia - Longstanding history of paroxysmal afib - Initial EKG this admission show afib - Telemetry shows sinus with PACs vs MAT with rates largely controlled in the 80s bpm - Suspect that supraventricular arrhythmias are being driven by respiratory illness, should continue to improve with ongoing treatment - Recommend K > 4 and mag > 2 - Continue Eliquis 2.5 mg twice daily - Will consolidate and increase metoprolol tartrate to 50 mg twice daily for rate control  Acute respiratory failure with hypoxia Multilobar pneumonia due to metapneumovirus - Currently reqiuring 4 L supplemental O2 - Likely driving her arrhythmia - Antibiotics and steroids per IM  HFrEF - Prior echo 04/2023 with EF 55-60% with  RWMA - Echo this admission with EF 45-55%, global hypokinesis, G2DD, elevated left atrial pressure, moderate MR, RA pressure 15 mmHg - Patient does not look grossly volume overloaded on exam - Will give IV Lasix 20 mg today, reevaluate for further diuresis need tomorrow - Continue metoprolol as above - Continue to escalate GDMT as BP and kidney function allows   Elevated troponin - Troponin mildly elevated in the setting of atrial fibrillation with RVR - Patient denies angina although underlying dementia makes thorough history difficult - Echo this admission with EF reduced slightly from 50-55% to 45-50% with global hypokinesis, G2DD, and mod MR - Recommend patient recover from current ongoing acute illness prior to consideration for further ischemic evaluation  CKD IIIa - Cr stable - Maintain net even fluid balance and avoid nephrotoxic agents  For questions or updates, please contact [facility] Please consult [email or web address] for contact info under        Signed, Orion Crook, PA-C  09/10/2023, 10:19 AM

## 2023-09-10 NOTE — Progress Notes (Signed)
 Left IMM with RN at nurse station, patient is droplet precaution.

## 2023-09-10 NOTE — Evaluation (Signed)
 Physical Therapy Evaluation Patient Details Name: Christine Johns MRN: [id number] DOB: 08/27/1940 Today's Date: 09/10/2023  History of Present Illness  83 y/o female presented to ED on 09/07/23 for AMS  and weakness. Admitted for multilobar pneumonia and Afib. PMH: paroxysmal atrial fibrillation, CKD, hypertension, hyperlipidemia, dementia  Clinical Impression  Patient admitted with the above. PTA, patient lives with husband and has caregiver 3-4x/week for 4 hours each day as well as family members who check in frequently. She is typically ambulatory with no AD and able to complete ADLs independently. Patient presents with weakness, impaired balance, and decreased activity tolerance. Oriented to self and place (with 3 choices given). Required minA for bed mobility and minA+2 to stand from EOB and recliner with initial posterior bias from EOB requiring up to modA to maintain balance. Ambulated 12' from recliner with CGA and RW with +2 for line management and chair follow. 3/4 DOE with RR up to mid 30s and spO2 >90% on 4L O2. Patient will benefit from skilled PT services during acute stay to address listed deficits. Patient will benefit from ongoing therapy at discharge to maximize functional independence and safety.         If plan is discharge home, recommend the following: A little help with walking and/or transfers;A little help with bathing/dressing/bathroom;Assistance with cooking/housework;Direct supervision/assist for medications management;Direct supervision/assist for financial management;Assist for transportation;Help with stairs or ramp for entrance;Supervision due to cognitive status   Can travel by private vehicle        Equipment Recommendations Rolling Christine Johns (2 wheels);BSC/3in1  Recommendations for Other Services       Functional Status Assessment Patient has had a recent decline in their functional status and demonstrates the ability to make significant improvements in function in a  reasonable and predictable amount of time.     Precautions / Restrictions Precautions Precautions: Fall Recall of Precautions/Restrictions: Impaired Restrictions Weight Bearing Restrictions Per Provider Order: No      Mobility  Bed Mobility Overal bed mobility: Needs Assistance Bed Mobility: Supine to Sit     Supine to sit: Min assist          Transfers Overall transfer level: Needs assistance Equipment used: 2 person hand held assist, Rolling Christine Johns (2 wheels) Transfers: Sit to/from Stand Sit to Stand: Min assist, +2 physical assistance, +2 safety/equipment           General transfer comment: posterior lean on initial stand and transfer to recliner. Upon second stand from recliner, patient with improved balance but assist for boost into standing and cues for hand placement    Ambulation/Gait Ambulation/Gait assistance: +2 safety/equipment, Contact guard assist Gait Distance (Feet): 12 Feet Assistive device: Rolling Christine Johns (2 wheels) Gait Pattern/deviations: Step-through pattern, Decreased stride length Gait velocity: decreased     General Gait Details: CGA for safety with +2 for line management and chair follow. 3/4 DOE with RR up to mid 30s, spO2 >90% on 4L  Stairs            Wheelchair Mobility     Tilt Bed    Modified Rankin (Stroke Patients Only)       Balance Overall balance assessment: Needs assistance Sitting-balance support: Bilateral upper extremity supported, Feet supported Sitting balance-Leahy Scale: Fair   Postural control: Posterior lean Standing balance support: Bilateral upper extremity supported, Reliant on assistive device for balance Standing balance-Leahy Scale: Poor  Pertinent Vitals/Pain Pain Assessment Pain Assessment: Faces Faces Pain Scale: No hurt Pain Intervention(s): Monitored during session    Home Living Family/patient expects to be discharged to:: Private  residence Living Arrangements: Spouse/significant other Available Help at Discharge: Family;Available 24 hours/day (3-4x per week 4 hrs per day; other familiy members will check in) Type of Home: House Home Access: Stairs to enter Entrance Stairs-Rails: Can reach both Entrance Stairs-Number of Steps: 2   Home Layout: One level Home Equipment: None      Prior Function Prior Level of Function : History of Falls (last six months)             Mobility Comments: amb with no AD ADLs Comments: supervision for ADLs, assist from family for IADLs     Extremity/Trunk Assessment   Upper Extremity Assessment Upper Extremity Assessment: Defer to OT evaluation    Lower Extremity Assessment Lower Extremity Assessment: Generalized weakness       Communication   Communication Communication: No apparent difficulties    Cognition Arousal: Alert Behavior During Therapy: WFL for tasks assessed/performed   PT - Cognitive impairments: History of cognitive impairments                         Following commands: Impaired Following commands impaired: Follows one step commands with increased time     Cueing       General Comments General comments (skin integrity, edema, etc.): on 4L O2 throughout, spo2 >90%. RR up to mid 30s with activity and 3/4 DOE    Exercises     Assessment/Plan    PT Assessment Patient needs continued PT services  PT Problem List Decreased strength;Decreased activity tolerance;Decreased balance;Decreased mobility;Decreased cognition;Decreased knowledge of use of DME;Decreased safety awareness;Decreased knowledge of precautions;Cardiopulmonary status limiting activity       PT Treatment Interventions DME instruction;Gait training;Functional mobility training;Therapeutic activities;Therapeutic exercise;Balance training;Patient/family education    PT Goals (Current goals can be found in the Care Plan section)  Acute Rehab PT Goals Patient Stated  Goal: did not state PT Goal Formulation: With family Time For Goal Achievement: 09/24/23 Potential to Achieve Goals: Fair    Frequency Min 2X/week     Co-evaluation               AM-PAC PT "6 Clicks" Mobility  Outcome Measure Help needed turning from your back to your side while in a flat bed without using bedrails?: A Little Help needed moving from lying on your back to sitting on the side of a flat bed without using bedrails?: A Little Help needed moving to and from a bed to a chair (including a wheelchair)?: A Little Help needed standing up from a chair using your arms (e.g., wheelchair or bedside chair)?: A Lot Help needed to walk in hospital room?: A Little Help needed climbing 3-5 steps with a railing? : Total 6 Click Score: 15    End of Session Equipment Utilized During Treatment: Oxygen Activity Tolerance: Patient tolerated treatment well Patient left: in chair;with call bell/phone within reach;with chair alarm set;with family/visitor present Nurse Communication: Mobility status PT Visit Diagnosis: Unsteadiness on feet (R26.81);Muscle weakness (generalized) (M62.81)    Time: 1610-9604 PT Time Calculation (min) (ACUTE ONLY): 25 min   Charges:   PT Evaluation $PT Eval Moderate Complexity: 1 Mod   PT General Charges $$ ACUTE PT VISIT: 1 Visit         Maylon Peppers, PT, DPT Physical Therapist - [place]  [place]  [facility]   Darril Patriarca A Dallis Darden 09/10/2023, 12:22 PM

## 2023-09-10 NOTE — Evaluation (Signed)
 Occupational Therapy Evaluation Patient Details Name: Christine Johns MRN: [id number] DOB: 1940/09/03 Today's Date: 09/10/2023   History of Present Illness   Pt is an 83 year old female presenting with AMS, work up includes Multilobar pneumonia due to hyponatremia    PMH significant for atrial fibrillation on Eliquis, currently not on rate control, hypertension, dementia, hyperlipidemia     Clinical Impressions Chart reviewed, pt greeted semi supine in bed with daughter present, oriented to self and grossly to place ( after provided 3 choices), increased time and multi modal cues required for one step direction following. Daughter reports that PTA pt performs ADLs with supervision, has assist with all IADLs and amb with no AD. They do have an aid 3-4x per week for 4 hrs per can (can increase per daughter) and family checks in on them regularly. Pt presents with deficits in strength, endurance, activity tolerance, balance, affecting safe and optimal ADL completion. Bed mobility competed with MIN A, STS with MIN A +2, MIN A +2 for short amb transfer to bedside chair with HHA,posterior lean noted throughout. Improved technique with RW use, pt amb approx 8' with RW with MIN A, +2 for lines/leads and chair follow. Supervision required fro doffing socks, MAX A for donning socks as pt reports fatigue. Anticipate supervision for seated grooming tasks. Discussed discharge recommendation with pt/daughter. Pt is left in bedside chair, all needs met. OT will follow acutely to facilitate retrn to PLOF and optimize safe ADL performance.      If plan is discharge home, recommend the following:   A little help with walking and/or transfers;A little help with bathing/dressing/bathroom     Functional Status Assessment   Patient has had a recent decline in their functional status and demonstrates the ability to make significant improvements in function in a reasonable and predictable amount of time.      Equipment Recommendations   BSC/3in1;Tub/shower seat     Recommendations for Other Services         Precautions/Restrictions   Precautions Precautions: Fall Recall of Precautions/Restrictions: Impaired Restrictions Weight Bearing Restrictions Per Provider Order: No     Mobility Bed Mobility Overal bed mobility: Needs Assistance Bed Mobility: Supine to Sit     Supine to sit: Min assist, HOB elevated          Transfers Overall transfer level: Needs assistance Equipment used: 2 person hand held assist, Rolling walker (2 wheels) Transfers: Sit to/from Stand Sit to Stand: Min assist, +2 physical assistance, +2 safety/equipment                  Balance Overall balance assessment: Needs assistance Sitting-balance support: Bilateral upper extremity supported, Feet supported Sitting balance-Leahy Scale: Fair   Postural control: Posterior lean Standing balance support: Bilateral upper extremity supported, Reliant on assistive device for balance Standing balance-Leahy Scale: Poor Standing balance comment: posterior lean with both HHA and RW despite multi modal cueing to correct; improved with RW use however                           ADL either performed or assessed with clinical judgement   ADL Overall ADL's : Needs assistance/impaired     Grooming: Sitting;Set up;Supervision/safety                 Lower Body Dressing Details (indicate cue type and reason): supervision to doff socks, MAX A to donn socks due to fatigue Toilet Transfer: Minimal assistance;+2 for  safety/equipment Toilet Transfer Details (indicate cue type and reason): simulated to bedside chair via HHA, posterior lean noted with frequent mutli modal cues throughout for technique         Functional mobility during ADLs: Contact guard assist;+2 for safety/equipment;Rolling walker (2 wheels) (approx 8' in room with close chair follow; improved with use of RW but posterior  lean continues to be noted)       Vision Patient Visual Report: No change from baseline Additional Comments: will continue to assess     Perception         Praxis         Pertinent Vitals/Pain Pain Assessment Pain Assessment: PAINAD Breathing: normal Negative Vocalization: none Facial Expression: smiling or inexpressive Body Language: relaxed Consolability: no need to console PAINAD Score: 0     Extremity/Trunk Assessment Upper Extremity Assessment Upper Extremity Assessment: Generalized weakness   Lower Extremity Assessment Lower Extremity Assessment: Generalized weakness       Communication Communication Communication: No apparent difficulties   Cognition Arousal: Alert Behavior During Therapy: WFL for tasks assessed/performed Cognition: History of cognitive impairments, Cognition impaired   Orientation impairments: Time, Situation (hospital after provided 3 choices) Awareness: Intellectual awareness impaired, Online awareness impaired Memory impairment (select all impairments): Short-term memory, Working [employer] fast streamer, Non-declarative long-term memory, Geneticist, molecular long-term memory Attention impairment (select first level of impairment): Sustained attention Executive functioning impairment (select all impairments): Sequencing, Reasoning, Problem solving OT - Cognition Comments: Pt daughter reports cognition waxes and wanes, today it is about her baseline per her report                 Following commands: Impaired Following commands impaired: Follows one step commands with increased time     Cueing  General Comments   Cueing Techniques: Verbal cues;Tactile cues;Visual cues  spo2 >90% on 4 L via [place] throughout, RR to 38 with exertion; HR 120s bpm with mobility   Exercises Other Exercises Other Exercises: edu pt and daugther re: role of OT, role of rehab, discharge recommendations, safe ADL completion with use of DME/compensatory strategies   Shoulder  Instructions      Home Living Family/patient expects to be discharged to:: Private residence Living Arrangements: Spouse/significant other Available Help at Discharge: Family;Available 24 hours/day (3-4x per week 4 hrs per day; other familiy members will check in) Type of Home: House Home Access: Stairs to enter [employer] of Steps: 2 Entrance Stairs-Rails: Can reach both Home Layout: One level     Bathroom Shower/Tub: Tub/shower unit;Sponge bathes at baseline   [employer]: Standard     Home Equipment: None          Prior Functioning/Environment Prior Level of Function : History of Falls (last six months)  Cognitive Assist : ADLs (cognitive)           Mobility Comments: amb with no AD ADLs Comments: supervision for ADLs, assist from family for IADLs    OT Problem List: Decreased strength;Decreased activity tolerance;Impaired balance (sitting and/or standing);Decreased cognition;Decreased knowledge of use of DME or AE   OT Treatment/Interventions: Self-care/ADL training;Therapeutic exercise;Patient/family education;Balance training;Therapeutic activities;Energy conservation;DME and/or AE instruction      OT Goals(Current goals can be found in the care plan section)   Acute Rehab OT Goals Patient Stated Goal: go home OT Goal Formulation: With patient/family Time For Goal Achievement: 09/24/23 ADL Goals Pt Will Perform Grooming: with supervision;sitting Pt Will Perform Lower Body Dressing: with supervision;sit to/from stand;sitting/lateral leans Pt Will Transfer to Toilet: with supervision;ambulating Pt  Will Perform Toileting - Clothing Manipulation and hygiene: with supervision;sitting/lateral leans;sit to/from stand   OT Frequency:  Min 3X/week    Co-evaluation PT/OT/SLP Co-Evaluation/Treatment: Yes Reason for Co-Treatment: For patient/therapist safety;To address functional/ADL transfers;Necessary to address cognition/behavior during  functional activity   OT goals addressed during session: ADL's and self-care      AM-PAC OT "6 Clicks" Daily Activity     Outcome Measure Help from another person eating meals?: None Help from another person taking care of personal grooming?: A Little Help from another person toileting, which includes using toliet, bedpan, or urinal?: A Lot Help from another person bathing (including washing, rinsing, drying)?: A Lot Help from another person to put on and taking off regular upper body clothing?: A Little Help from another person to put on and taking off regular lower body clothing?: A Lot 6 Click Score: 16   End of Session Equipment Utilized During Treatment: Rolling walker (2 wheels);Oxygen Nurse Communication: Mobility status  Activity Tolerance: Patient tolerated treatment well Patient left: in chair;with call bell/phone within reach;with chair alarm set;with family/visitor present  OT Visit Diagnosis: Other abnormalities of gait and mobility (R26.89);Muscle weakness (generalized) (M62.81)                Time: 4696-2952 OT Time Calculation (min): 26 min Charges:  OT General Charges $OT Visit: 1 Visit OT Evaluation $OT Eval Moderate Complexity: 1 Mod Oleta Mouse, OTD OTR/L  09/10/23, 1:26 PM

## 2023-09-10 NOTE — Progress Notes (Signed)
 [facility] Progress Note  Patient: Christine Johns    ZOX:[id number]  DOA: 09/07/2023     Date of Service: the patient was seen and examined on 09/10/2023  Chief Complaint  Patient presents with   Altered Mental Status   Brief hospital course: Ms. Christine Johns is an 83 year old female with history of atrial fibrillation on Eliquis, currently not on rate control, hypertension, dementia, hyperlipidemia, who presents emergency department for chief concerns of altered mental status.   Patient presented via EMS and per nursing documentation, patient received sodium chloride 500 mL liter bolus per EMS.   Vitals in the ED showed temperature 99, respiration rate 29, improved to 22, heart rate initially 50, currently 80, and on EKG showed atrial fibrillation with rate of 129, QTc 552.  Blood pressure is 159/98, SpO2 95% on room air.   Serum sodium is 135, potassium 2.8, chloride 107, bicarb 18, BUN of 17, serum creatinine 1.27, EGFR 43, nonfasting blood glucose 109, WBC 4.5, hemoglobin 11.2, platelets of 127.   High-sensitivity troponin 65.  Lactic acid 1.6.  Magnesium 1.2.  COVID/influenza A/influenza B/RSV PCR were negative.   Blood cultures x 2 are in process.   ED treatment: Doxycycline 100 mg p.o. one-time dose, diltiazem 10 mg x 2 doses, potassium chloride 40 mEq p.o. one-time dose, ceftriaxone 2 g IV one-time dose, magnesium 2 g IV one-time dose, sodium chloride 1 L bolus, diltiazem gtt. initiated.   Assessment and Plan:  # Multilobar pneumonia due to metapneumovirus Azithromycin 500 mg IV daily to complete a 5-day course, ceftriaxone 2 g IV daily to complete a 5-day course,  S/p NS 1 L bolus ordered by EDP and LR 500 mL bolus on admission Incentive spirometry, flutter valve Oxygen supplementation as needed to maintain SpO2 greater than 92% 4/7 had an episode of aspiration and hypoxia, suction was done, hypoxia improved, patient did not require intubation. Added Flagyl to cover  anaerobes, patient is allergic to penicillin RVP positive for metapneumovirus, continue droplet precautions Started Solu-Medrol 40 mg IV twice daily x 3 doses followed by prednisone 40 mg p.o. daily for 4 days  # Multifocal atrial tachycardia's/SVT H/o Atrial fibrillation  Home Eliquis resumed on admission Patient currently not on a rate control medication S/p diltiazem GGT DC'd on 4/8 S/p Lopressor 10 mg IV 1 dose given, s/p Lopressor 50 BID 4/8 BP soft, cardiology recommended NS 500 mL bolus 4/9 changed to Lopressor 50 mg p.o. 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  Moderate MR 4/9 Lasix 20 mg x 1 dose given.  No immediate plan for ischemic workup, this can be done when respiratory status improves. Follow cardiology   Essential hypertension Home lisinopril 10 mg pod, Dc'd 4/7 s/p Lopressor 50 mg p.o. BID due to A-fib with RVR 4/9 started Lopressor 50 mg p.o. twice daily  # Acute gastroenteritis, most likely due to virus infection Diarrhea resolved Monitor electrolytes   # Metabolic acidosis Repleted bicarb IV Monitor electrolytes and replete as needed  Stage 3b chronic kidney disease At baseline   Mixed hyperlipidemia Lovastatin equivalent, pravastatin 40 mg nightly ordered   History of normocytic normochromic anemia At baseline    # Hyponatremia, monitor sodium level daily # Hypophosphatemia, Phos repleted. # Hypomagnesemia Status magnesium 2 g IV ordered by  EDP Recheck magnesium level in the morning   # Acute hypokalemia due to diarrhea Status post potassium chloride 40 mEq p.o. one-time dose, potassium chloride 10 mill equivalent, 2 doses ordered by EDP LR 500 mL liter bolus on admission Monitor  electrolytes 4/7 potassium 2.9, repleted IV  Acute delirium,  s/p Seroquel 50 mg x 1 dose given 4/8, patient was sleepy so decreased dose of Seroquel 12.5 mg p.o. twice daily 4/9 decreased Seroquel 12.5 mg p.o. every evening  Body mass index is 20.93 kg/m.  Interventions:  Diet: Regular diet DVT Prophylaxis: Therapeutic Anticoagulation with Eliquis    Advance goals of care discussion: Full code  Family Communication: family was present at bedside, at the time of interview.  The pt provided permission to discuss medical plan with the family. Opportunity was given to ask question and all questions were answered satisfactorily.  Patient has dementia AO x 1 4/8 discussed with patient's daughter at bedside.   Disposition:  Pt is from home, admitted with respiratory failure due to pneumonia, A-fib with RVR, diarrhea, hypokalemia, still has respiratory failure and A-fib with RVR, and electrolyte imbalance, which precludes a safe discharge. Discharge to home, when stable, may need few days to improve.  Subjective: No significant events overnight, patient was resting all day, feels improvement in the shortness of breath, denied any chest pain or palpitations.    Physical Exam: General: Mild respiratory distress, lying comfortably Appear in mild resp distress, affect flat Eyes: PERRLA ENT: Oral Mucosa Clear, moist  Neck: no JVD,  Cardiovascular: Irregular rhythm, no Murmur,  Respiratory: Equal air entry bilaterally, Mild crackles bilaterally and mild wheezes  Abdomen: Bowel Sound present, Soft and no tenderness,  Skin: no rashes Extremities: no Pedal edema, no calf tenderness Neurologic: without any new focal findings Gait not checked due to patient safety concerns  Vitals:   09/10/23 1100 09/10/23 1146 09/10/23 1200 09/10/23 1259  BP: 127/77  (!) 127/97 (!) 141/71  Pulse: (!) 106 (!) 104 (!) 108 (!) 102  Resp: (!) 29  (!) 25 (!) 24  Temp:    98.9 F (37.2 C)  TempSrc:    Oral   SpO2: 98%  94% 97%  Weight:      Height:        Intake/Output Summary (Last 24 hours) at 09/10/2023 1431 Last data filed at 09/10/2023 1200 Gross per 24 hour  Intake 264.3 ml  Output 1500 ml  Net -1235.7 ml   Filed Weights   09/08/23 2200  Weight: 53.6 kg    Data Reviewed: I have personally reviewed and interpreted daily labs, tele strips, imagings as discussed above. I reviewed all nursing notes, pharmacy notes, vitals, pertinent old records I have discussed plan of care as described above with RN and patient/family.  CBC: Recent Labs  Lab 09/07/23 1053 09/08/23 0424 09/09/23 0344 09/10/23 0415  WBC 4.5 4.4 11.4* 8.6  NEUTROABS 3.3  --   --   --   HGB 11.1* 10.2* 10.8* 8.9*  HCT 34.1* 29.7* 30.8* 25.1*  MCV 93.7 87.9 86.3 84.8  PLT 127* 111* 123* 118*   Basic Metabolic Panel: Recent Labs  Lab 09/07/23 1053 09/08/23 0424 09/08/23 1805 09/09/23 0344 09/09/23 1830 09/10/23 0415  NA 137 136 134* 132* 138 136  K 2.8* 2.9* 2.9* 3.6 3.4* 3.3*  CL 107 107 104 104 106 106  CO2 18* 22 18* 19* 20* 23  GLUCOSE 109* 96 156* 175* 218* 164*  BUN 17 16 15 16  18  21  CREATININE 1.23* 1.13* 1.06* 1.10* 1.24* 1.13*  CALCIUM 8.0* 7.8* 8.0* 7.8* 7.8* 7.6*  MG 1.2* 1.7  --  1.3* 2.9* 2.6*  PHOS  --  2.5  --  2.0* 3.8 2.8    Studies: ECHOCARDIOGRAM COMPLETE Result Date: 09/10/2023    ECHOCARDIOGRAM REPORT   Patient Name:   GREGG HOLSTER Date of Exam: 09/09/2023 Medical Rec #:  [id number]     Height:       63.0 in Accession #:    [id number]    Weight:       118.2 lb Date of Birth:  04/18/1941     BSA:          1.546 m Patient Age:    83 years      BP:           91/44 mmHg Patient Gender: F             HR:           78 bpm. Exam Location:  [facility] Procedure: 2D Echo, Cardiac Doppler and Color Doppler (Both Spectral and Color            Flow Doppler were utilized during procedure). Indications:     I48.91 Atrial Fibrillation  History:         Patient has prior history of Echocardiogram  examinations, most                  recent 05/02/2023. Arrythmias:Paroxysmal Atrial Fibrillation;                  Risk Factors:Hypertension and Dyslipidemia.  Sonographer:     Daphine Deutscher RDCS Referring Phys:  [id number] Gillis Santa Diagnosing Phys: Yvonne Kendall MD IMPRESSIONS  1. Left ventricular ejection fraction, by estimation, is 45 to 50%. The left ventricle has mildly decreased function. The left ventricle demonstrates global hypokinesis. There is mild asymmetric left ventricular hypertrophy of the basal-septal segment. Left ventricular diastolic parameters are consistent with Grade II diastolic dysfunction (pseudonormalization). Elevated left atrial pressure.  2. Right ventricular systolic function is normal. The right ventricular size is normal.  3. Left atrial size was mildly dilated.  4. The mitral valve is degenerative. Moderate mitral valve regurgitation. No evidence of mitral stenosis.  5. The aortic valve is tricuspid. There is mild thickening of the aortic valve. Aortic valve regurgitation is not visualized. Aortic valve sclerosis is present, with no evidence of aortic valve stenosis.  6. The inferior vena cava is dilated in size with <50% respiratory variability, suggesting right atrial pressure of 15 mmHg. FINDINGS  Left Ventricle: Left ventricular ejection fraction, by estimation, is 45 to 50%. The left ventricle has mildly decreased function. The left ventricle demonstrates global hypokinesis. The left ventricular internal cavity size was normal in size. There is  mild asymmetric left ventricular hypertrophy of the basal-septal segment. Left ventricular diastolic parameters are consistent with Grade II diastolic dysfunction (pseudonormalization). Elevated left atrial pressure. Right Ventricle: The right ventricular size is normal. No increase in right ventricular wall thickness. Right ventricular systolic function is normal. Left Atrium: Left atrial size was mildly dilated. Right  Atrium: Right atrial size was normal in size. Pericardium: Trivial pericardial effusion is present. Mitral Valve: The mitral valve is degenerative in appearance. There is mild thickening of the mitral valve leaflet(s). Moderate mitral valve regurgitation. No evidence of mitral valve stenosis. Tricuspid Valve: The tricuspid valve is normal in structure. Tricuspid valve regurgitation is trivial. Aortic Valve: The aortic  valve is tricuspid. There is mild thickening of the aortic valve. Aortic valve regurgitation is not visualized. Aortic valve sclerosis is present, with no evidence of aortic valve stenosis. Aortic valve mean gradient measures 5.5  mmHg. Aortic valve peak gradient measures 8.6 mmHg. Aortic valve area, by VTI measures 1.02 cm. Pulmonic Valve: The pulmonic valve was normal in structure. Pulmonic valve regurgitation is mild. No evidence of pulmonic stenosis. Aorta: The aortic root and ascending aorta are structurally normal, with no evidence of dilitation. Pulmonary Artery: The pulmonary artery is of normal size. Venous: The inferior vena cava is dilated in size with less than 50% respiratory variability, suggesting right atrial pressure of 15 mmHg. IAS/Shunts: The interatrial septum was not well visualized.  LEFT VENTRICLE PLAX 2D LVIDd:         5.00 cm     Diastology LVIDs:         3.20 cm     LV e' medial:    5.66 cm/s LV PW:         0.80 cm     LV E/e' medial:  18.6 LV IVS:        1.12 cm     LV e' lateral:   6.74 cm/s LVOT diam:     1.80 cm     LV E/e' lateral: 15.6 LV SV:         36 LV SV Index:   23 LVOT Area:     2.54 cm  LV Volumes (MOD) LV vol d, MOD A2C: 61.2 ml LV vol d, MOD A4C: 66.2 ml LV vol s, MOD A2C: 37.9 ml LV vol s, MOD A4C: 35.9 ml LV SV MOD A2C:     23.3 ml LV SV MOD A4C:     66.2 ml LV SV MOD BP:      27.5 ml RIGHT VENTRICLE             IVC RV Basal diam:  3.70 cm     IVC diam: 2.10 cm RV S prime:     11.60 cm/s TAPSE (M-mode): 2.4 cm LEFT ATRIUM             Index        RIGHT  ATRIUM          Index LA diam:        3.40 cm 2.20 cm/m   RA Area:     9.82 cm LA Vol (A2C):   63.4 ml 41.01 ml/m  RA Volume:   22.30 ml 14.42 ml/m LA Vol (A4C):   46.1 ml 29.82 ml/m LA Biplane Vol: 54.1 ml 34.99 ml/m  AORTIC VALVE AV Area (Vmax):    1.22 cm AV Area (Vmean):   1.11 cm AV Area (VTI):     1.02 cm AV Vmax:           146.30 cm/s AV Vmean:          113.896 cm/s AV VTI:            0.353 m AV Peak Grad:      8.6 mmHg AV Mean Grad:      5.5 mmHg LVOT Vmax:         70.20 cm/s LVOT Vmean:        49.533 cm/s LVOT VTI:          0.141 m LVOT/AV VTI ratio: 0.40  AORTA Ao Root diam: 3.20 cm Ao Asc diam:  3.40 cm MITRAL VALVE MV Area (PHT): 3.32 cm  SHUNTS MV Decel Time: 229 msec     Systemic VTI:  0.14 m MV E velocity: 105.00 cm/s  Systemic Diam: 1.80 cm MV A velocity: 95.90 cm/s MV E/A ratio:  1.09 Christopher End MD Electronically signed by Yvonne Kendall MD Signature Date/Time: 09/10/2023/8:55:54 AM    Final      Scheduled Meds:  apixaban  2.5 mg Oral BID   calcium-vitamin D  2 tablet Oral Daily   Chlorhexidine Gluconate Cloth  6 each Topical Daily   cholecalciferol  1,000 Units Oral Daily   feeding supplement  237 mL Oral TID BM   guaiFENesin  600 mg Oral BID   ipratropium-albuterol  3 mL Nebulization TID   metoprolol tartrate  50 mg Oral BID   multivitamin with minerals  1 tablet Oral Daily   pantoprazole  40 mg Oral Daily   pravastatin  40 mg Oral q1800   predniSONE  40 mg Oral Q breakfast   QUEtiapine  12.5 mg Oral BID   Continuous Infusions:  azithromycin 500 mg (09/10/23 1031)   cefTRIAXone (ROCEPHIN)  IV 2 g (09/10/23 1152)   metronidazole 500 mg (09/10/23 1254)   PRN Meds: acetaminophen **OR** acetaminophen, ondansetron **OR** ondansetron (ZOFRAN) IV, mouth rinse, senna-docusate  Time spent: 55 minutes  Author: Gillis Santa. MD [facility] 09/10/2023 2:31 PM  To reach On-call, see care teams to locate the attending and reach out to them via  [email or web address]. If 7PM-7AM, please contact night-coverage If you still have difficulty reaching the attending provider, please page the [facility] (Director on Call) for [facility] on amion for assistance.

## 2023-09-11 ENCOUNTER — Telehealth (HOSPITAL_COMMUNITY): Payer: Self-pay | Admitting: Pharmacy Technician

## 2023-09-11 ENCOUNTER — Other Ambulatory Visit (HOSPITAL_COMMUNITY): Payer: Self-pay

## 2023-09-11 DIAGNOSIS — R918 Other nonspecific abnormal finding of lung field: Secondary | ICD-10-CM | POA: Diagnosis not present

## 2023-09-11 LAB — BASIC METABOLIC PANEL WITH GFR
Anion gap: 9 (ref 5–15)
BUN: 29 mg/dL — ABNORMAL HIGH (ref 8–23)
CO2: 25 mmol/L (ref 22–32)
Calcium: 7.9 mg/dL — ABNORMAL LOW (ref 8.9–10.3)
Chloride: 104 mmol/L (ref 98–111)
Creatinine, Ser: 1.25 mg/dL — ABNORMAL HIGH (ref 0.44–1.00)
GFR, Estimated: 42 mL/min — ABNORMAL LOW (ref >60)
Glucose, Bld: 136 mg/dL — ABNORMAL HIGH (ref 70–99)
Potassium: 3.3 mmol/L — ABNORMAL LOW (ref 3.5–5.1)
Sodium: 138 mmol/L (ref 135–145)

## 2023-09-11 LAB — CBC
HCT: 27 % — ABNORMAL LOW (ref 36.0–46.0)
Hemoglobin: 9.4 g/dL — ABNORMAL LOW (ref 12.0–15.0)
MCH: 29.8 pg (ref 26.0–34.0)
MCHC: 34.8 g/dL (ref 30.0–36.0)
MCV: 85.7 fL (ref 80.0–100.0)
Platelets: 165 10*3/uL (ref 150–400)
RBC: 3.15 MIL/uL — ABNORMAL LOW (ref 3.87–5.11)
RDW: 13.9 % (ref 11.5–15.5)
WBC: 11.7 10*3/uL — ABNORMAL HIGH (ref 4.0–10.5)
nRBC: 0 % (ref 0.0–0.2)

## 2023-09-11 LAB — MAGNESIUM: Magnesium: 2.1 mg/dL (ref 1.7–2.4)

## 2023-09-11 LAB — PHOSPHORUS: Phosphorus: 2.7 mg/dL (ref 2.5–4.6)

## 2023-09-11 MED ORDER — MELATONIN 5 MG PO TABS
2.5000 mg | ORAL_TABLET | Freq: Every evening | ORAL | Status: DC | PRN
  Administered 2023-09-12 – 2023-09-13 (×3): 2.5 mg via ORAL
  Filled 2023-09-11 (×4): qty 1

## 2023-09-11 MED ORDER — POTASSIUM CHLORIDE CRYS ER 20 MEQ PO TBCR
40.0000 meq | EXTENDED_RELEASE_TABLET | Freq: Two times a day (BID) | ORAL | Status: AC
Start: 2023-09-11 — End: 2023-09-11
  Administered 2023-09-11 (×2): 40 meq via ORAL
  Filled 2023-09-11 (×2): qty 2

## 2023-09-11 MED ORDER — IPRATROPIUM-ALBUTEROL 0.5-2.5 (3) MG/3ML IN SOLN
3.0000 mL | Freq: Two times a day (BID) | RESPIRATORY_TRACT | Status: DC
  Administered 2023-09-11 – 2023-09-13 (×5): 3 mL via RESPIRATORY_TRACT
  Filled 2023-09-11 (×5): qty 3

## 2023-09-11 NOTE — Care Management Important Message (Signed)
 Important Message  Patient Details  Name: Christine Johns MRN: [id number] Date of Birth: Aug 16, 1940   Important Message Given:  Yes - Medicare IM     Marcell Anger 09/11/2023, 12:43 PM

## 2023-09-11 NOTE — Progress Notes (Signed)
 Rounding Note    Patient Name: Christine Johns Date of Encounter: 09/11/2023  [facility] Cardiologist: Yvonne Kendall, MD   Subjective   Sitting up in recliner, not on oxygen Breathing improving, still with thick cough On nebulizers every 6 hours Positive for metapneumovirus Telemetry reviewed showing normal sinus rhythm, frequent PACs, short runs of atrial tachycardia/SVT - Tolerating metoprolol tartrate 50 twice daily - On broad-spectrum antibiotics  Inpatient Medications    Scheduled Meds:  apixaban  2.5 mg Oral BID   calcium-vitamin D  2 tablet Oral Daily   Chlorhexidine Gluconate Cloth  6 each Topical Daily   cholecalciferol  1,000 Units Oral Daily   feeding supplement  237 mL Oral TID BM   guaiFENesin  600 mg Oral BID   ipratropium-albuterol  3 mL Nebulization BID   metoprolol tartrate  50 mg Oral BID   multivitamin with minerals  1 tablet Oral Daily   pantoprazole  40 mg Oral Daily   potassium chloride  40 mEq Oral BID   pravastatin  40 mg Oral q1800   predniSONE  40 mg Oral Q breakfast   QUEtiapine  12.5 mg Oral QPM   Continuous Infusions:  metronidazole 500 mg (09/11/23 0116)   PRN Meds: acetaminophen **OR** acetaminophen, ondansetron **OR** ondansetron (ZOFRAN) IV, mouth rinse, senna-docusate   Vital Signs    Vitals:   09/10/23 2348 09/11/23 0500 09/11/23 0700 09/11/23 0825  BP: 130/80  (!) 144/80 (!) 132/94  Pulse:   96 88  Resp: 18  17 16   Temp: 97.6 F (36.4 C)  98.5 F (36.9 C) 98 F (36.7 C)  TempSrc: Oral  Oral Oral  SpO2: 99%  95% 92%  Weight:  55.1 kg    Height:        Intake/Output Summary (Last 24 hours) at 09/11/2023 1211 Last data filed at 09/10/2023 2015 Gross per 24 hour  Intake 910 ml  Output 1700 ml  Net -790 ml      09/11/2023    5:00 AM 09/08/2023   10:00 PM 05/02/2023    4:27 AM  Last 3 Weights  Weight (lbs) 121 lb 7.6 oz 118 lb 2.7 oz 113 lb 1.5 oz  Weight (kg) 55.1 kg 53.6 kg 51.3 kg      Telemetry     Normal sinus rhythm with MAT/SVT- Personally Reviewed  ECG     - Personally Reviewed  Physical Exam   GEN: No acute distress.   Neck: No JVD Cardiac: RRR with ectopy, no murmurs, rubs, or gallops.  Respiratory: Coarse breath sounds bilaterally GI: Soft, nontender, non-distended  MS: No edema; No deformity. Neuro:  Nonfocal  Psych: Normal affect   Labs    High Sensitivity Troponin:   Recent Labs  Lab 09/07/23 1053 09/08/23 0424  TROPONINIHS 65* 78*     Chemistry Recent Labs  Lab 09/07/23 1053 09/08/23 0424 09/09/23 1830 09/10/23 0415 09/11/23 0749  NA 137   < > 138 136 138  K 2.8*   < > 3.4* 3.3* 3.3*  CL 107   < > 106 106 104  CO2 18*   < > 20* 23 25  GLUCOSE 109*   < > 218* 164* 136*  BUN 17   < > 18 21 29*  CREATININE 1.23*   < > 1.24* 1.13* 1.25*  CALCIUM 8.0*   < > 7.8* 7.6* 7.9*  MG 1.2*   < > 2.9* 2.6* 2.1  PROT 6.4*  --   --   --   --  ALBUMIN 3.1*  --   --   --   --   AST 27  --   --   --   --   ALT 12  --   --   --   --   ALKPHOS 42  --   --   --   --   BILITOT 0.8  --   --   --   --   GFRNONAA 43*   < > 43* 48* 42*  ANIONGAP 12   < > 12 7 9    < > = values in this interval not displayed.    Lipids No results for input(s): "CHOL", "TRIG", "HDL", "LABVLDL", "LDLCALC", "CHOLHDL" in the last 168 hours.  Hematology Recent Labs  Lab 09/09/23 0344 09/10/23 0415 09/11/23 0749  WBC 11.4* 8.6 11.7*  RBC 3.57* 2.96* 3.15*  HGB 10.8* 8.9* 9.4*  HCT 30.8* 25.1* 27.0*  MCV 86.3 84.8 85.7  MCH 30.3 30.1 29.8  MCHC 35.1 35.5 34.8  RDW 13.5 13.8 13.9  PLT 123* 118* 165   Thyroid  Recent Labs  Lab 09/07/23 1053  TSH 0.726  FREET4 0.85    BNPNo results for input(s): "BNP", "PROBNP" in the last 168 hours.  DDimer No results for input(s): "DDIMER" in the last 168 hours.   Radiology    ECHOCARDIOGRAM COMPLETE Result Date: 09/10/2023    ECHOCARDIOGRAM REPORT   Patient Name:   Christine Johns Date of Exam: 09/09/2023 Medical Rec #:  [id number]      Height:       63.0 in Accession #:    [id number]    Weight:       118.2 lb Date of Birth:  1940/07/24     BSA:          1.546 m Patient Age:    83 years      BP:           91/44 mmHg Patient Gender: F             HR:           78 bpm. Exam Location:  [facility] Procedure: 2D Echo, Cardiac Doppler and Color Doppler (Both Spectral and Color            Flow Doppler were utilized during procedure). Indications:     I48.91 Atrial Fibrillation  History:         Patient has prior history of Echocardiogram examinations, most                  recent 05/02/2023. Arrythmias:Paroxysmal Atrial Fibrillation;                  Risk Factors:Hypertension and Dyslipidemia.  Sonographer:     Daphine Deutscher RDCS Referring Phys:  [id number] Gillis Santa Diagnosing Phys: Yvonne Kendall MD IMPRESSIONS  1. Left ventricular ejection fraction, by estimation, is 45 to 50%. The left ventricle has mildly decreased function. The left ventricle demonstrates global hypokinesis. There is mild asymmetric left ventricular hypertrophy of the basal-septal segment. Left ventricular diastolic parameters are consistent with Grade II diastolic dysfunction (pseudonormalization). Elevated left atrial pressure.  2. Right ventricular systolic function is normal. The right ventricular size is normal.  3. Left atrial size was mildly dilated.  4. The mitral valve is degenerative. Moderate mitral valve regurgitation. No evidence of mitral stenosis.  5. The aortic valve is tricuspid. There is mild thickening of the aortic valve. Aortic valve regurgitation is not visualized. Aortic valve sclerosis  is present, with no evidence of aortic valve stenosis.  6. The inferior vena cava is dilated in size with <50% respiratory variability, suggesting right atrial pressure of 15 mmHg. FINDINGS  Left Ventricle: Left ventricular ejection fraction, by estimation, is 45 to 50%. The left ventricle has mildly decreased function. The left ventricle demonstrates global hypokinesis.  The left ventricular internal cavity size was normal in size. There is  mild asymmetric left ventricular hypertrophy of the basal-septal segment. Left ventricular diastolic parameters are consistent with Grade II diastolic dysfunction (pseudonormalization). Elevated left atrial pressure. Right Ventricle: The right ventricular size is normal. No increase in right ventricular wall thickness. Right ventricular systolic function is normal. Left Atrium: Left atrial size was mildly dilated. Right Atrium: Right atrial size was normal in size. Pericardium: Trivial pericardial effusion is present. Mitral Valve: The mitral valve is degenerative in appearance. There is mild thickening of the mitral valve leaflet(s). Moderate mitral valve regurgitation. No evidence of mitral valve stenosis. Tricuspid Valve: The tricuspid valve is normal in structure. Tricuspid valve regurgitation is trivial. Aortic Valve: The aortic valve is tricuspid. There is mild thickening of the aortic valve. Aortic valve regurgitation is not visualized. Aortic valve sclerosis is present, with no evidence of aortic valve stenosis. Aortic valve mean gradient measures 5.5  mmHg. Aortic valve peak gradient measures 8.6 mmHg. Aortic valve area, by VTI measures 1.02 cm. Pulmonic Valve: The pulmonic valve was normal in structure. Pulmonic valve regurgitation is mild. No evidence of pulmonic stenosis. Aorta: The aortic root and ascending aorta are structurally normal, with no evidence of dilitation. Pulmonary Artery: The pulmonary artery is of normal size. Venous: The inferior vena cava is dilated in size with less than 50% respiratory variability, suggesting right atrial pressure of 15 mmHg. IAS/Shunts: The interatrial septum was not well visualized.  LEFT VENTRICLE PLAX 2D LVIDd:         5.00 cm     Diastology LVIDs:         3.20 cm     LV e' medial:    5.66 cm/s LV PW:         0.80 cm     LV E/e' medial:  18.6 LV IVS:        1.12 cm     LV e' lateral:   6.74  cm/s LVOT diam:     1.80 cm     LV E/e' lateral: 15.6 LV SV:         36 LV SV Index:   23 LVOT Area:     2.54 cm  LV Volumes (MOD) LV vol d, MOD A2C: 61.2 ml LV vol d, MOD A4C: 66.2 ml LV vol s, MOD A2C: 37.9 ml LV vol s, MOD A4C: 35.9 ml LV SV MOD A2C:     23.3 ml LV SV MOD A4C:     66.2 ml LV SV MOD BP:      27.5 ml RIGHT VENTRICLE             IVC RV Basal diam:  3.70 cm     IVC diam: 2.10 cm RV S prime:     11.60 cm/s TAPSE (M-mode): 2.4 cm LEFT ATRIUM             Index        RIGHT ATRIUM          Index LA diam:        3.40 cm 2.20 cm/m   RA Area:  9.82 cm LA Vol (A2C):   63.4 ml 41.01 ml/m  RA Volume:   22.30 ml 14.42 ml/m LA Vol (A4C):   46.1 ml 29.82 ml/m LA Biplane Vol: 54.1 ml 34.99 ml/m  AORTIC VALVE AV Area (Vmax):    1.22 cm AV Area (Vmean):   1.11 cm AV Area (VTI):     1.02 cm AV Vmax:           146.30 cm/s AV Vmean:          113.896 cm/s AV VTI:            0.353 m AV Peak Grad:      8.6 mmHg AV Mean Grad:      5.5 mmHg LVOT Vmax:         70.20 cm/s LVOT Vmean:        49.533 cm/s LVOT VTI:          0.141 m LVOT/AV VTI ratio: 0.40  AORTA Ao Root diam: 3.20 cm Ao Asc diam:  3.40 cm MITRAL VALVE MV Area (PHT): 3.32 cm     SHUNTS MV Decel Time: 229 msec     Systemic VTI:  0.14 m MV E velocity: 105.00 cm/s  Systemic Diam: 1.80 cm MV A velocity: 95.90 cm/s MV E/A ratio:  1.09 Christopher End MD Electronically signed by Yvonne Kendall MD Signature Date/Time: 09/10/2023/8:55:54 AM    Final     Cardiac Studies   Echo Ejection fraction 45 to 50% Moderate MR, dilated IVC  Patient Profile     Christine Johns is a 83 y.o. female with a hx of paroxysmal atrial fibrillation, hypertension, hyperlipidemia, chronic kidney disease, and dementia, who is being seen for the continued evaluation of arrhythmia.   Assessment & Plan    Multifocal atrial tachycardia/SVT Noted on initial EKG and on telemetry -Metoprolol initiated, consolidated to tartrate 50 twice daily yesterday -Eliquis 2.5 twice  daily given concern for paroxysmal atrial fibrillation -High risk of recurrent arrhythmia in the setting of respiratory distress   COPD exacerbation/acute respiratory failure with hypoxia/ pneumonia Johns to metapneumovirus X-ray of chest concerning for pneumonia /left basilar opacity - On antibiotics, steroids   Cardiomyopathy Ejection fraction 45 to 55% down from 55 to 60% November 2024 Also with moderate MR -Received dose of IV Lasix yesterday -Given elevated BUN and creatinine today we will hold off on Lasix at this time   Elevated troponin In the setting of MAT, tachycardia, respiratory distress/pneumonia/metapneumovirus No immediate plans for ischemic workup, this can be deferred and considered once respiratory status improves    For questions or updates, please contact [facility] Please consult [email or web address] for contact info under        Signed, Julien Nordmann, MD  09/11/2023, 12:11 PM

## 2023-09-11 NOTE — Consult Note (Addendum)
 PHARMACY CONSULT NOTE - ELECTROLYTES  Pharmacy Consult for Electrolyte Monitoring and Replacement   Recent Labs: Height: 5\' 3"  (160 cm) Weight: 55.1 kg (121 lb 7.6 oz) IBW/kg (Calculated) : 52.4 Estimated Creatinine Clearance: 27.2 mL/min (A) (by C-G formula based on SCr of 1.25 mg/dL (H)). Potassium (mmol/L)  Date Value  09/11/2023 3.3 (L)   Magnesium (mg/dL)  Date Value  16/03/9603 2.1   Calcium (mg/dL)  Date Value  54/02/8118 7.9 (L)   Albumin (g/dL)  Date Value  14/78/2956 3.1 (L)   Phosphorus (mg/dL)  Date Value  21/30/8657 2.7   Sodium (mmol/L)  Date Value  09/11/2023 138    Assessment  Christine Johns is a 83 y.o. female presenting with altered mental status. PMH significant for atrial fibrillation on Eliquis, currently not on rate control, hypertension, dementia, hyperlipidemia. Pharmacy has been consulted to monitor and replace electrolytes.  Received lasix 20 mg IV x 1 on 4/9. Scr trending up.   Goal of Therapy: Electrolytes WNL  Plan:  Kcl 40 mEq x 2 F/u with AM labs.   Thank you for allowing pharmacy to be a part of this patient's care.  Ronnald Ramp, PharmD Clinical Pharmacist 09/11/2023 8:49 AM

## 2023-09-11 NOTE — Progress Notes (Signed)
       CROSS COVER NOTE  NAME: Christine Johns MRN: [id number] DOB : 07-27-1940 ATTENDING PHYSICIAN: Gillis Santa, MD    Date of Service   09/11/2023   HPI/Events of Note   Message received from RN Pt came in for AMS, afib rvr ( on cardiac drip but now discontinue) dx with multifocal lung infiltrate on (droplet precaution). Pt daughter is at bedside and is requesting something to help with patient sleep. Pt received seroquel 12.5 mg at 1803. Per report pt received 50 mg on 09/09/23 and sleep x 24 hours. Pt does have hx dementia. Can we have something to help with sleep. thank you   Interventions   Assessment/Plan: 2.5 mg melatonin        [name] NP [facility] Cross Cover 7pm-7am - check amion for availability Pager [phone]

## 2023-09-11 NOTE — Progress Notes (Signed)
 09/11/23 Patient is droplet precaution, IMM given to nurse at nurse station.

## 2023-09-11 NOTE — Telephone Encounter (Signed)
 Patient Product/process development scientist completed.    The patient is insured through [employer]. Patient has Medicare and is not eligible for a copay card, but may be able to apply for patient assistance or Medicare RX Payment Plan (Patient Must reach out to their plan, if eligible for payment plan), if available.    Ran test claim for Farxiga 10 mg and the current 30 day co-pay is $64.00.  Ran test claim for Jardiance 10 mg and the current 30 day co-pay is $40.00.  This test claim was processed through [facility]- copay amounts may vary at other pharmacies due to pharmacy/plan contracts, or as the patient moves through the different stages of their insurance plan.     [name], CPHT Pharmacy Technician III Certified Patient Advocate [facility] Patient Advocate Team Direct Number: [phone]  Fax: [phone]

## 2023-09-11 NOTE — TOC Initial Note (Addendum)
 Transition of Care [facility]) - Initial/Assessment Note    Patient Details  Name: Christine Johns MRN: [id number] Date of Birth: 03-16-1941  [facility] CM/SW Contact:    Truddie Hidden, RN Phone Number: 09/11/2023, 10:42 AM  Clinical Narrative:   Admitted for: weakness related to multilobar pneumonia due to metapneumovirus  Admitted from:home with spouse Pharmacy: [facility]- no difficulty getting medications Transportation: has a caregiver at home and family Dtr or caregiver will transport at discharge Current home health/prior home health/DME:none                 Spoke with patient's daughter, Maudie Mercury regarding therapy's recommendation for HHPT/ OT. Maudie Mercury is agreeable and will get the name of the agency her father uses. She is agreeable to received a RW and a BSC. She was advised the DME will be delivered to the patient's room.   Request for DME sent to Jon from [employer].   11:35am Patient's daughter would like [facility]. She inquired about a discharge date for patient. She was advised that is determined by the MD.  Referral for [facility] sent to and accepted by Velna Hatchet from [place].          Patient Goals and CMS Choice            Expected Discharge Plan and Services                                              Prior Living Arrangements/Services                       Activities of Daily Living   ADL Screening (condition at time of admission) Independently performs ADLs?: No Does the patient have a NEW difficulty with bathing/dressing/toileting/self-feeding that is expected to last >3 days?: Yes (Initiates electronic notice to provider for possible OT consult) Does the patient have a NEW difficulty with getting in/out of bed, walking, or climbing stairs that is expected to last >3 days?: Yes (Initiates electronic notice to provider for possible PT consult) Does the patient have a NEW difficulty with communication that is expected to last >3  days?: No Is the patient deaf or have difficulty hearing?: Yes Does the patient have difficulty seeing, even when wearing glasses/contacts?: No Does the patient have difficulty concentrating, remembering, or making decisions?: No  Permission Sought/Granted                  Emotional Assessment              Admission diagnosis:  Hypokalemia [E87.6] Hypomagnesemia [E83.42] Atrial fibrillation with rapid ventricular response (HCC) [I48.91] Pneumonia due to infectious organism, unspecified laterality, unspecified part of lung [J18.9] Multilobar lung infiltrate [R91.8] Patient Active Problem List   Diagnosis Date Noted   Pneumonia due to infectious organism 09/10/2023   Multifocal atrial tachycardia (HCC) 09/10/2023   Malnutrition of moderate degree 09/09/2023   Multilobar lung infiltrate 09/07/2023   Elevated troponin 09/07/2023   Syncope 05/01/2023   Essential hypertension 09/02/2022   Mixed hyperlipidemia 09/02/2022   Osteopenia 09/02/2022   History of normocytic normochromic anemia 04/24/2022   Thrombocytopenia (HCC) 01/24/2022   Cognitive impairment 04/05/2020   Syncope and collapse    Acute hypokalemia    Hypomagnesemia    Atrial fibrillation with rapid ventricular response (HCC) 03/27/2020   Stage 3b chronic kidney  disease (HCC) 02/11/2020   Routine general medical examination at a health care facility 04/18/2016   PCP:  Marisue Ivan, MD Pharmacy:   [facility] [phone] - [place], [place] - [address] AT [facility] OF [address] [place] [address] Phone: [phone] Fax: [phone]  [facility] [address] [place] [address] Phone: [phone] Fax: [phone]     Social Drivers of Health (SDOH) Social History: SDOH Screenings   Food Insecurity: No Food Insecurity (09/08/2023)  Housing: Low Risk  (09/08/2023)  Transportation Needs: No Transportation Needs (09/08/2023)   Utilities: Not At Risk (09/08/2023)  Financial Resource Strain: Low Risk  (04/29/2023)   Received from [facility]  Social Connections: Moderately Integrated (09/08/2023)  Tobacco Use: Low Risk  (09/07/2023)   SDOH Interventions:     Readmission Risk Interventions     No data to display

## 2023-09-11 NOTE — Progress Notes (Signed)
 Occupational Therapy Treatment Patient Details Name: Christine Johns MRN: [id number] DOB: Mar 11, 1941 Today's Date: 09/11/2023   History of present illness Pt is an 83 year old female presenting with AMS, work up includes Multilobar pneumonia due to hyponatremia    PMH significant for atrial fibrillation on Eliquis, currently not on rate control, hypertension, dementia, hyperlipidemia   OT comments  Chart reviewed to date, pt greeted in bed, oriented to self, pleasant and participatory. OT tx session targeted improving functional activity tolerance in the setting of ADL tasks. Improvements noted throughout on this date, pt requiring MIN A +1 for STS, CGA-MIN A with RW to amb to bathroom, toileting with MAX A after continent BM. Pt continues to perform ADL/functional mobility below PLOF, will continue to benefit from acute OT to address functional deficits and to facilitate optimal ADL performance. Pt is left in bedside chair, safety maintained, all needs met. OT will continue to follow.       If plan is discharge home, recommend the following:  A little help with walking and/or transfers;A little help with bathing/dressing/bathroom   Equipment Recommendations  BSC/3in1;Tub/shower seat    Recommendations for Other Services      Precautions / Restrictions Precautions Precautions: Fall Recall of Precautions/Restrictions: Impaired Restrictions Weight Bearing Restrictions Per Provider Order: No       Mobility Bed Mobility Overal bed mobility: Needs Assistance Bed Mobility: Supine to Sit     Supine to sit: Min assist, HOB elevated          Transfers Overall transfer level: Needs assistance Equipment used: Rolling walker (2 wheels), None Transfers: Sit to/from Stand Sit to Stand: Min assist           General transfer comment: attempted STS with no AD from bed, posterior lean with pt bracing on bed. Improved with use of RW.     Balance Overall balance assessment: Needs  assistance Sitting-balance support: Bilateral upper extremity supported, Feet supported Sitting balance-Leahy Scale: Fair   Postural control: Posterior lean Standing balance support: Bilateral upper extremity supported, Reliant on assistive device for balance Standing balance-Leahy Scale: Poor                             ADL either performed or assessed with clinical judgement   ADL Overall ADL's : Needs assistance/impaired Eating/Feeding: Minimal assistance;Sitting                   Lower Body Dressing: Maximal assistance   Toilet Transfer: Contact guard assist;Minimal assistance;Rolling walker (2 wheels);Regular Toilet;Cueing for sequencing   Toileting- Clothing Manipulation and Hygiene: Maximal assistance;Sit to/from stand Toileting - Clothing Manipulation Details (indicate cue type and reason): after continent BM On toilet     Functional mobility during ADLs: Contact guard assist;Minimal assistance;Rolling walker (2 wheels);Cueing for sequencing (approx 15' two attempts)      Extremity/Trunk Assessment              Vision       Perception     Praxis     Communication Communication Communication: No apparent difficulties   Cognition Arousal: Alert Behavior During Therapy: WFL for tasks assessed/performed Cognition: History of cognitive impairments, Cognition impaired             OT - Cognition Comments: cognition appears to be at baseline                 Following commands: Impaired Following commands impaired: Follows  one step commands with increased time      Cueing   Cueing Techniques: Verbal cues, Tactile cues, Visual cues  Exercises Other Exercises Other Exercises: edu pt/daugther re: role of OT, safe ADL completion with use of AE/DME    Shoulder Instructions       General Comments Spo2 on RA was 90% at rest, 89-90% after walking to bathroom, then 87% after walking back to chair but went up to 90% after about 30  seconds, HR 80s bpm post mobility    Pertinent Vitals/ Pain       Pain Assessment Pain Assessment: PAINAD Breathing: normal Negative Vocalization: none Facial Expression: smiling or inexpressive Body Language: relaxed Consolability: no need to console PAINAD Score: 0  Home Living                                          Prior Functioning/Environment              Frequency  Min 3X/week        Progress Toward Goals  OT Goals(current goals can now be found in the care plan section)  Progress towards OT goals: Progressing toward goals  Acute Rehab OT Goals Time For Goal Achievement: 09/24/23  Plan      Co-evaluation                 AM-PAC OT "6 Clicks" Daily Activity     Outcome Measure   Help from another person eating meals?: None Help from another person taking care of personal grooming?: A Little Help from another person toileting, which includes using toliet, bedpan, or urinal?: A Lot Help from another person bathing (including washing, rinsing, drying)?: A Lot Help from another person to put on and taking off regular upper body clothing?: A Little Help from another person to put on and taking off regular lower body clothing?: A Lot 6 Click Score: 16    End of Session Equipment Utilized During Treatment: Rolling walker (2 wheels)  OT Visit Diagnosis: Other abnormalities of gait and mobility (R26.89);Muscle weakness (generalized) (M62.81)   Activity Tolerance Patient tolerated treatment well   Patient Left in chair;with call bell/phone within reach;with chair alarm set;with family/visitor present   Nurse Communication Mobility status        Time: 4166-0630 OT Time Calculation (min): 21 min  Charges: OT General Charges $OT Visit: 1 Visit OT Treatments $Self Care/Home Management : 8-22 mins  Oleta Mouse, OTD OTR/L  09/11/23, 9:50 AM

## 2023-09-11 NOTE — Plan of Care (Signed)
 Pt confused; daughter at bedside; med taken; pt daughter requested sleeping med, melatonin 2.5mg  given; vitals stable.  Problem: Education: Goal: Knowledge of General Education information will improve Description: Including pain rating scale, medication(s)/side effects and non-pharmacologic comfort measures Outcome: Progressing   Problem: Health Behavior/Discharge Planning: Goal: Ability to manage health-related needs will improve Outcome: Progressing   Problem: Clinical Measurements: Goal: Ability to maintain clinical measurements within normal limits will improve Outcome: Progressing Goal: Will remain free from infection Outcome: Progressing Goal: Diagnostic test results will improve Outcome: Progressing Goal: Respiratory complications will improve Outcome: Progressing Goal: Cardiovascular complication will be avoided Outcome: Progressing   Problem: Activity: Goal: Risk for activity intolerance will decrease Outcome: Progressing   Problem: Nutrition: Goal: Adequate nutrition will be maintained Outcome: Progressing   Problem: Coping: Goal: Level of anxiety will decrease Outcome: Progressing   Problem: Elimination: Goal: Will not experience complications related to bowel motility Outcome: Progressing Goal: Will not experience complications related to urinary retention Outcome: Progressing   Problem: Pain Managment: Goal: General experience of comfort will improve and/or be controlled Outcome: Progressing   Problem: Safety: Goal: Ability to remain free from injury will improve Outcome: Progressing   Problem: Skin Integrity: Goal: Risk for impaired skin integrity will decrease Outcome: Progressing

## 2023-09-11 NOTE — Progress Notes (Signed)
 Patient is not able to walk the distance required to go the bathroom, or he/she is unable to safely negotiate stairs required to access the bathroom.  A 3in1 BSC will alleviate this problem

## 2023-09-11 NOTE — Progress Notes (Addendum)
 [facility] Progress Note  Patient: Christine Johns    UEA:[id number]  DOA: 09/07/2023     Date of Service: the patient was seen and examined on 09/11/2023  Chief Complaint  Patient presents with   Altered Mental Status   Brief hospital course: Christine Johns is an 83 year old female with history of atrial fibrillation on Eliquis, currently not on rate control, hypertension, dementia, hyperlipidemia, who presents emergency department for chief concerns of altered mental status.   Patient presented via EMS and per nursing documentation, patient received sodium chloride 500 mL liter bolus per EMS.   Vitals in the ED showed temperature 99, respiration rate 29, improved to 22, heart rate initially 50, currently 80, and on EKG showed atrial fibrillation with rate of 129, QTc 552.  Blood pressure is 159/98, SpO2 95% on room air.   Serum sodium is 135, potassium 2.8, chloride 107, bicarb 18, BUN of 17, serum creatinine 1.27, EGFR 43, nonfasting blood glucose 109, WBC 4.5, hemoglobin 11.2, platelets of 127.   High-sensitivity troponin 65.  Lactic acid 1.6.  Magnesium 1.2.  COVID/influenza A/influenza B/RSV PCR were negative.   Blood cultures x 2 are in process.   ED treatment: Doxycycline 100 mg p.o. one-time dose, diltiazem 10 mg x 2 doses, potassium chloride 40 mEq p.o. one-time dose, ceftriaxone 2 g IV one-time dose, magnesium 2 g IV one-time dose, sodium chloride 1 L bolus, diltiazem gtt. initiated.   Assessment and Plan:  # Multilobar pneumonia due to metapneumovirus S/p Azithromycin 500 mg IV daily and Ceftriaxone 2 g IV daily, completed 5-day course.   S/p NS 1 L bolus ordered by EDP and LR 500 mL bolus on admission Incentive spirometry, flutter valve Oxygen supplementation as needed to maintain SpO2 greater than 92% 4/7 had an episode of aspiration and hypoxia, suction was done, hypoxia improved, patient did not require intubation. Added Flagyl to cover anaerobes, patient is  allergic to penicillin RVP positive for metapneumovirus, continue droplet precautions Started Solu-Medrol 40 mg IV twice daily x 3 doses followed by prednisone 40 mg p.o. daily for 4 days Continue Flagyl 500 twice daily for total 5 days.  # Multifocal atrial tachycardia's/SVT H/o Atrial fibrillation  Home Eliquis resumed on admission Patient currently not on a rate control medication S/p diltiazem GGT DC'd on 4/8 S/p Lopressor 10 mg IV 1 dose given, s/p Lopressor 50 BID 4/8 BP soft, cardiology recommended NS 500 mL bolus 4/9 changed to Lopressor 50 mg p.o. 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  Moderate MR 4/9 Lasix 20 mg x 1 dose given.  No immediate plan for ischemic workup, this can be done when respiratory status improves. Follow cardiology 4/10 creatinine 1.25 slightly elevated, hold off Lasix for now as per cardio  Essential hypertension Home lisinopril 10 mg pod, Dc'd 4/7 s/p Lopressor 50 mg p.o. BID due to A-fib with RVR 4/9 started Lopressor 50 mg p.o. twice daily  # Acute gastroenteritis, most likely due to virus infection Diarrhea resolved Monitor electrolytes   # Metabolic acidosis: Repleted bicarb IV.  Resolved Monitor electrolytes and replete as needed  Stage 3b chronic kidney disease Cr 1.13---1.25 Continue to monitor  Mixed hyperlipidemia Lovastatin equivalent, pravastatin 40 mg nightly ordered   History of normocytic normochromic anemia At baseline   #  Hyponatremia, monitor sodium level daily # Hypophosphatemia, Phos repleted. Resolved  # Hypomagnesemia: Mag repleted. Resolved  Recheck magnesium level in the morning   # Acute hypokalemia due to diarrhea Status post potassium chloride 40  mEq p.o. one-time dose, potassium chloride 10 mill equivalent, 2 doses ordered by EDP LR 500 mL liter bolus on admission Monitor electrolytes 4/7 potassium 2.9, repleted IV 4/10 K3.3, repleted orally  Acute delirium,  s/p Seroquel 50 mg x 1 dose given 4/8, patient was sleepy so decreased dose of Seroquel 12.5 mg p.o. twice daily 4/9 decreased Seroquel 12.5 mg p.o. every evening  Body mass index is 21.52 kg/m.  Interventions:  Diet: Regular diet DVT Prophylaxis: Therapeutic Anticoagulation with Eliquis    Advance goals of care discussion: Full code  Family Communication: family was present at bedside, at the time of interview.  The pt provided permission to discuss medical plan with the family. Opportunity was given to ask question and all questions were answered satisfactorily.  Patient has dementia AO x 1 4/8 discussed with patient's daughter at bedside.   Disposition:  Pt is from home, admitted with respiratory failure due to pneumonia, A-fib with RVR, diarrhea, hypokalemia, gradually improving, most likely discharge in 1 to 2 days.   Discharge to home, when stable, most likely tomorrow a.m. if cleared by cardiology  Subjective: No significant events overnight, patient was sitting comfortably in the recliner, feels improvement in the shortness of breath, saturating well on room air.  Still has productive cough and mild shortness of breath.  Denies any chest pain or palpitation, no any other active issues.   Physical Exam: General: NAD, mild SOB, sitting comfortably in the recliner.   Eyes: PERRLA ENT: Oral Mucosa Clear, moist  Neck: no JVD,  Cardiovascular: Irregular rhythm, no Murmur,  Respiratory: Equal air entry bilaterally, Mild crackles and mild wheezes b/l  Abdomen: Bowel Sound present, Soft and no tenderness,  Skin: no rashes Extremities: no Pedal edema, no calf tenderness Neurologic: without any new focal findings Gait not checked due to patient safety  concerns  Vitals:   09/11/23 0500 09/11/23 0700 09/11/23 0825 09/11/23 1341  BP:  (!) 144/80 (!) 132/94 132/82  Pulse:  96 88 80  Resp:  17 16 16   Temp:  98.5 F (36.9 C) 98 F (36.7 C)   TempSrc:  Oral Oral   SpO2:  95% 92% 93%  Weight: 55.1 kg     Height:        Intake/Output Summary (Last 24 hours) at 09/11/2023 1603 Last data filed at 09/10/2023 2015 Gross per 24 hour  Intake 910 ml  Output 1700 ml  Net -790 ml   Filed Weights   09/08/23 2200 09/11/23 0500  Weight: 53.6 kg 55.1 kg    Data Reviewed: I have personally reviewed and interpreted daily labs, tele strips, imagings as discussed above. I reviewed all nursing notes, pharmacy notes, vitals, pertinent old records I have discussed plan of care as described above with RN and patient/family.  CBC: Recent Labs  Lab 09/07/23 1053 09/08/23 0424 09/09/23 0344 09/10/23 0415 09/11/23 0749  WBC 4.5 4.4 11.4* 8.6 11.7*  NEUTROABS 3.3  --   --   --   --   HGB 11.1* 10.2* 10.8* 8.9* 9.4*  HCT 34.1* 29.7* 30.8* 25.1* 27.0*  MCV 93.7 87.9 86.3 84.8 85.7  PLT 127* 111* 123* 118* 165   Basic Metabolic Panel: Recent Labs  Lab 09/08/23 0424 09/08/23 1805 09/09/23 0344 09/09/23 1830 09/10/23 0415  09/11/23 0749  NA 136 134* 132* 138 136 138  K 2.9* 2.9* 3.6 3.4* 3.3* 3.3*  CL 107 104 104 106 106 104  CO2 22 18* 19* 20* 23 25  GLUCOSE 96 156* 175* 218* 164* 136*  BUN 16 15 16 18 21  29*  CREATININE 1.13* 1.06* 1.10* 1.24* 1.13* 1.25*  CALCIUM 7.8* 8.0* 7.8* 7.8* 7.6* 7.9*  MG 1.7  --  1.3* 2.9* 2.6* 2.1  PHOS 2.5  --  2.0* 3.8 2.8 2.7    Studies: No results found.    Scheduled Meds:  apixaban  2.5 mg Oral BID   calcium-vitamin D  2 tablet Oral Daily   Chlorhexidine Gluconate Cloth  6 each Topical Daily   cholecalciferol  1,000 Units Oral Daily   feeding supplement  237 mL Oral TID BM   guaiFENesin  600 mg Oral BID   ipratropium-albuterol  3 mL Nebulization BID   metoprolol tartrate  50 mg Oral BID    multivitamin with minerals  1 tablet Oral Daily   pantoprazole  40 mg Oral Daily   potassium chloride  40 mEq Oral BID   pravastatin  40 mg Oral q1800   predniSONE  40 mg Oral Q breakfast   QUEtiapine  12.5 mg Oral QPM   Continuous Infusions:  metronidazole 500 mg (09/11/23 1433)   PRN Meds: acetaminophen **OR** acetaminophen, ondansetron **OR** ondansetron (ZOFRAN) IV, mouth rinse, senna-docusate  Time spent: 40 minutes  Author: Gillis Santa. MD [facility] 09/11/2023 4:03 PM  To reach On-call, see care teams to locate the attending and reach out to them via [email or web address]. If 7PM-7AM, please contact night-coverage If you still have difficulty reaching the attending provider, please page the [facility] (Director on Call) for [facility] on amion for assistance.

## 2023-09-11 NOTE — Progress Notes (Signed)
Heart Failure Navigator Progress Note  Assessed for Heart & Vascular TOC clinic readiness.  Patient does not meet criteria due to Dementia diagnosis.  Navigator will sign off at this time.  Roxy Horseman, RN, BSN [facility] Heart Failure Navigator Secure Chat Only

## 2023-09-12 ENCOUNTER — Inpatient Hospital Stay

## 2023-09-12 DIAGNOSIS — J189 Pneumonia, unspecified organism: Secondary | ICD-10-CM

## 2023-09-12 DIAGNOSIS — I4719 Other supraventricular tachycardia: Secondary | ICD-10-CM

## 2023-09-12 DIAGNOSIS — I5022 Chronic systolic (congestive) heart failure: Secondary | ICD-10-CM

## 2023-09-12 DIAGNOSIS — Z515 Encounter for palliative care: Secondary | ICD-10-CM

## 2023-09-12 DIAGNOSIS — R918 Other nonspecific abnormal finding of lung field: Secondary | ICD-10-CM | POA: Diagnosis not present

## 2023-09-12 DIAGNOSIS — I4891 Unspecified atrial fibrillation: Secondary | ICD-10-CM | POA: Diagnosis not present

## 2023-09-12 LAB — BASIC METABOLIC PANEL WITH GFR
Anion gap: 10 (ref 5–15)
BUN: 32 mg/dL — ABNORMAL HIGH (ref 8–23)
CO2: 23 mmol/L (ref 22–32)
Calcium: 8.3 mg/dL — ABNORMAL LOW (ref 8.9–10.3)
Chloride: 106 mmol/L (ref 98–111)
Creatinine, Ser: 1.32 mg/dL — ABNORMAL HIGH (ref 0.44–1.00)
GFR, Estimated: 40 mL/min — ABNORMAL LOW (ref >60)
Glucose, Bld: 137 mg/dL — ABNORMAL HIGH (ref 70–99)
Potassium: 4.4 mmol/L (ref 3.5–5.1)
Sodium: 139 mmol/L (ref 135–145)

## 2023-09-12 LAB — CULTURE, BLOOD (ROUTINE X 2)
Culture: NO GROWTH
Culture: NO GROWTH

## 2023-09-12 LAB — CBC
HCT: 29.2 % — ABNORMAL LOW (ref 36.0–46.0)
Hemoglobin: 10.2 g/dL — ABNORMAL LOW (ref 12.0–15.0)
MCH: 30 pg (ref 26.0–34.0)
MCHC: 34.9 g/dL (ref 30.0–36.0)
MCV: 85.9 fL (ref 80.0–100.0)
Platelets: 197 10*3/uL (ref 150–400)
RBC: 3.4 MIL/uL — ABNORMAL LOW (ref 3.87–5.11)
RDW: 14 % (ref 11.5–15.5)
WBC: 11.3 10*3/uL — ABNORMAL HIGH (ref 4.0–10.5)
nRBC: 0.3 % — ABNORMAL HIGH (ref 0.0–0.2)

## 2023-09-12 MED ORDER — BUDESONIDE 0.25 MG/2ML IN SUSP
0.2500 mg | Freq: Two times a day (BID) | RESPIRATORY_TRACT | Status: DC
  Administered 2023-09-12 – 2023-09-16 (×9): 0.25 mg via RESPIRATORY_TRACT
  Filled 2023-09-12 (×9): qty 2

## 2023-09-12 MED ORDER — ARFORMOTEROL TARTRATE 15 MCG/2ML IN NEBU
15.0000 ug | INHALATION_SOLUTION | Freq: Two times a day (BID) | RESPIRATORY_TRACT | Status: DC
  Administered 2023-09-12 – 2023-09-16 (×9): 15 ug via RESPIRATORY_TRACT
  Filled 2023-09-12 (×10): qty 2

## 2023-09-12 NOTE — Progress Notes (Signed)
 [facility] Progress Note  Patient: Christine Johns    ZOX:[id number]  DOA: 09/07/2023     Date of Service: the patient was seen and examined on 09/12/2023  Chief Complaint  Patient presents with   Altered Mental Status   Brief hospital course: Ms. Christine Johns is an 83 year old female with history of atrial fibrillation on Eliquis, currently not on rate control, hypertension, dementia, hyperlipidemia, who presents emergency department for chief concerns of altered mental status.   Patient presented via EMS and per nursing documentation, patient received sodium chloride 500 mL liter bolus per EMS.   Vitals in the ED showed temperature 99, respiration rate 29, improved to 22, heart rate initially 50, currently 80, and on EKG showed atrial fibrillation with rate of 129, QTc 552.  Blood pressure is 159/98, SpO2 95% on room air.   Serum sodium is 135, potassium 2.8, chloride 107, bicarb 18, BUN of 17, serum creatinine 1.27, EGFR 43, nonfasting blood glucose 109, WBC 4.5, hemoglobin 11.2, platelets of 127.   High-sensitivity troponin 65.  Lactic acid 1.6.  Magnesium 1.2.  COVID/influenza A/influenza B/RSV PCR were negative.   Blood cultures x 2 are in process.   ED treatment: Doxycycline 100 mg p.o. one-time dose, diltiazem 10 mg x 2 doses, potassium chloride 40 mEq p.o. one-time dose, ceftriaxone 2 g IV one-time dose, magnesium 2 g IV one-time dose, sodium chloride 1 L bolus, diltiazem gtt. initiated.   Assessment and Plan:  # Multilobar pneumonia due to metapneumovirus S/p Azithromycin 500 mg IV daily and Ceftriaxone 2 g IV daily, completed 5-day course.   S/p NS 1 L bolus ordered by EDP and LR 500 mL bolus on admission Incentive spirometry, flutter valve Oxygen supplementation as needed to maintain SpO2 greater than 92% 4/7 had an episode of aspiration and hypoxia, suction was done, hypoxia improved, patient did not require intubation. Added Flagyl to cover anaerobes, patient is  allergic to penicillin RVP positive for metapneumovirus, continue droplet precautions Started Solu-Medrol 40 mg IV twice daily x 3 doses followed by prednisone 40 mg p.o. daily for 4 days Continue Flagyl 500 twice daily for total 5 days. 4/11 CXR: Shows pulmonary edema and pleural effusion Discussed with the patient's daughter, she does not want to move forward for thoracentesis at this time.  We will continue to monitor.   # Multifocal atrial tachycardia's/SVT H/o Atrial fibrillation  Home Eliquis resumed on admission Patient currently not on a rate control medication S/p diltiazem GGT DC'd on 4/8 S/p Lopressor 10 mg IV 1 dose given, s/p Lopressor 50 BID 4/8 BP soft, cardiology recommended NS 500 mL bolus 4/9 changed to Lopressor 50 mg p.o. 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  Moderate MR 4/9 Lasix 20 mg x 1 dose given.  No immediate plan for ischemic workup, this can be done when respiratory status improves. Follow cardiology 4/10 creatinine 1.25 slightly elevated, hold off Lasix for now as per cardio 4/11 Cr 1.32 elevated, hold off Lasix for now   Essential hypertension Home lisinopril 10 mg pod, Dc'd 4/7 s/p Lopressor 50 mg p.o. BID due to A-fib with RVR 4/9 started Lopressor 50 mg p.o. twice daily  # Acute gastroenteritis, most likely due to virus infection Diarrhea resolved Monitor electrolytes   # Metabolic  acidosis: Repleted bicarb IV.  Resolved Monitor electrolytes and replete as needed  Stage 3b chronic kidney disease Cr 1.13---1.25--1.32 elevated Continue to monitor  Mixed hyperlipidemia Lovastatin equivalent, pravastatin 40 mg nightly ordered   History of normocytic normochromic anemia At  baseline   # Hyponatremia, monitor sodium level daily # Hypophosphatemia, Phos repleted. Resolved  # Hypomagnesemia: Mag repleted. Resolved  Recheck magnesium level in the morning   # Acute hypokalemia due to diarrhea Status post potassium chloride 40 mEq p.o. one-time dose, potassium chloride 10 mill equivalent, 2 doses ordered by EDP LR 500 mL liter bolus on admission Monitor electrolytes 4/7 potassium 2.9, repleted IV 4/10 K3.3, repleted orally  Acute delirium,  s/p Seroquel 50 mg x 1 dose given 4/8, patient was sleepy so decreased dose of Seroquel 12.5 mg p.o. twice daily 4/9 decreased Seroquel 12.5 mg p.o. every evening  Body mass index is 21.17 kg/m.  Interventions:  Diet: Regular diet DVT Prophylaxis: Therapeutic Anticoagulation with Eliquis    Advance goals of care discussion: Full code  Family Communication: family was present at bedside, at the time of interview.  The pt provided permission to discuss medical plan with the family. Opportunity was given to ask question and all questions were answered satisfactorily.  Patient has dementia AO x 1 4/11 discussed with patient's daughter over the phone   Disposition:  Pt is from home, admitted with respiratory failure due to pneumonia, A-fib with RVR, diarrhea, hypokalemia, still patient is not stable to discharge, may need few days to improve. Discharge to home, when stable, most likely on Monday due to worsening of shortness of breath and developed pleural effusion, may need thoracentesis on Monday  Subjective: It seems the patient developed hypoxia, she was placed on [place] oxygen due to worsening of shortness of breath.  Patient has dementia unable to offer any complaints, seems a little bit short of breath, denied any chest palpitations.   Physical Exam: General: NAD, laying in the bed with mild SOB Eyes: PERRLA ENT: Oral Mucosa Clear, moist  Neck: no JVD,  Cardiovascular: Irregular rhythm, no Murmur,   Respiratory: Equal air entry bilaterally, bilateral crackles, decreased breath sound bibasilar area, no significant wheezes  Abdomen: Bowel Sound present, Soft and no tenderness,  Skin: no rashes Extremities: no Pedal edema, no calf tenderness Neurologic: without any new focal findings Gait not checked due to patient safety concerns  Vitals:   09/12/23 0444 09/12/23 0737 09/12/23 1122 09/12/23 1223  BP:  (!) 144/94 (!) 153/87   Pulse:  89 60   Resp:  18 18   Temp:  97.6 F (36.4 C) 98.6 F (37 C)   TempSrc:      SpO2:  99% 97% 95%  Weight: 54.2 kg     Height:        Intake/Output Summary (Last 24 hours) at 09/12/2023 1443 Last data filed at 09/12/2023 1048 Gross per 24 hour  Intake 195 ml  Output --  Net 195 ml   Filed Weights   09/08/23 2200 09/11/23 0500 09/12/23 0444  Weight: 53.6 kg 55.1 kg 54.2 kg    Data Reviewed: I have personally reviewed and interpreted daily labs, tele strips, imagings as discussed above. I reviewed all nursing notes, pharmacy notes, vitals, pertinent old records I have discussed plan of care as described above with RN and patient/family.  CBC: Recent Labs  Lab 09/07/23 1053 09/08/23 0424 09/09/23 0344 09/10/23 0415 09/11/23 0749 09/12/23 0602  WBC 4.5 4.4 11.4* 8.6 11.7* 11.3*  NEUTROABS 3.3  --   --   --   --   --   HGB 11.1* 10.2* 10.8* 8.9* 9.4* 10.2*  HCT 34.1* 29.7* 30.8* 25.1* 27.0* 29.2*  MCV 93.7 87.9 86.3 84.8 85.7 85.9  PLT 127* 111* 123* 118* 165 197   Basic Metabolic Panel: Recent Labs  Lab 09/08/23 0424 09/08/23 1805 09/09/23 0344 09/09/23 1830 09/10/23 0415 09/11/23 0749 09/12/23 0602  NA 136   < > 132* 138 136 138 139  K 2.9*   < > 3.6 3.4* 3.3* 3.3* 4.4  CL 107   < > 104 106 106 104 106  CO2 22   < > 19* 20* 23 25 23   GLUCOSE 96   < > 175* 218* 164* 136* 137*  BUN 16   < > 16 18 21  29* 32*  CREATININE 1.13*   < > 1.10* 1.24* 1.13* 1.25* 1.32*  CALCIUM 7.8*   < > 7.8* 7.8* 7.6* 7.9* 8.3*  MG 1.7  --   1.3* 2.9* 2.6* 2.1  --   PHOS 2.5  --  2.0* 3.8 2.8 2.7  --    < > = values in this interval not displayed.    Studies: DG Chest Port 1 View Result Date: 09/12/2023 CLINICAL DATA:  [id number] Pulmonary edema [id number]. EXAM: PORTABLE CHEST 1 VIEW COMPARISON:  09/08/2023. FINDINGS: There is mild-to-moderate diffuse pulmonary vascular congestion with bilateral hilar and bibasilar predominance. There are bilateral small-to-moderate layering pleural effusions. No pneumothorax. Mildly enlarged cardio-mediastinal silhouette, which is likely accentuated by low lung volume and AP technique. No acute osseous abnormalities. The soft tissues are within normal limits. IMPRESSION: *Findings favor congestive heart failure/pulmonary edema. Electronically Signed   By: Jules Schick M.D.   On: 09/12/2023 14:35      Scheduled Meds:  apixaban  2.5 mg Oral BID   arformoterol  15 mcg Nebulization BID   budesonide (PULMICORT) nebulizer solution  0.25 mg Nebulization BID   calcium-vitamin D  2 tablet Oral Daily   Chlorhexidine Gluconate Cloth  6 each Topical Daily   cholecalciferol  1,000 Units Oral Daily   feeding supplement  237 mL Oral TID BM   guaiFENesin  600 mg Oral BID   ipratropium-albuterol  3 mL Nebulization BID   metoprolol tartrate  50 mg Oral BID   multivitamin with minerals  1 tablet Oral Daily   pantoprazole  40 mg Oral Daily   pravastatin  40 mg Oral q1800   predniSONE  40 mg Oral Q breakfast   QUEtiapine  12.5 mg Oral QPM   Continuous Infusions:  metronidazole 500 mg (09/12/23 0037)   PRN Meds: melatonin, mouth rinse, senna-docusate  Time spent: 55 minutes  Author: Gillis Santa. MD [facility] 09/12/2023 2:43 PM  To reach On-call, see care teams to locate the attending and reach out to them via [email or web address]. If 7PM-7AM, please contact night-coverage If you still have difficulty reaching the attending provider, please page the [facility] (Director on Call) for [facility] on amion  for assistance.

## 2023-09-12 NOTE — Plan of Care (Signed)

## 2023-09-12 NOTE — Consult Note (Signed)
 Consultation Note Date: 09/12/2023   Patient Name: Christine Johns  DOB: [birth year]  MRN: [id number]  Age / Sex: 83 y.o., female  PCP: Marisue Ivan, MD Referring Physician: Gillis Santa, MD  Reason for Consultation: Establishing goals of care   HPI/Brief Hospital Course: 83 y.o. female  with past medical history of A-fib on Eliquis, hypertension, dementia, hyperlipidemia admitted from home on 09/07/2023 with altered mental status.  Found to have multilobar pneumonia due to metapneumovirus-has completed antibiotic course  Palliative medicine was consulted for assisting with goals of care conversations.  Subjective:  Extensive chart review has been completed prior to meeting patient including labs, vital signs, imaging, progress notes, orders, and available advanced directive documents from current and previous encounters.  Visited with Ms. Tangonan at her bedside.  She is awake, alert, able to engage in conversation but unable to answer orientation questions appropriately.  Son-in-law at bedside and daughter Maudie Mercury available by FaceTime.  Introduced myself as a Publishing rights manager as a member of the palliative care team. Explained palliative medicine is specialized medical care for people living with serious illness. It focuses on providing relief from the symptoms and stress of a serious illness. The goal is to improve quality of life for both the patient and the family.   Daughter shares prior to admission Ms. Ressel functioned in her home with her husband independently. Maudie Mercury and her husband live in [place] but fly down frequently to visit with Ms. Kulpa.  They have home health coming into the home to help with Ms. Blakney and her husband.  Maudie Mercury shares that she is an only child and is appointed healthcare power of attorney.  She is able to explain her understanding of Ms. Mcgeehan's current medical condition. Maudie Mercury speaks to Ms. Pann's strength and that  she remains in integral part in caring for her husband.  We discussed patient's current illness and what it means in the larger context of patient's on-going co-morbidities. Natural disease trajectory and expectations at EOL were discussed.   Attempted to elicit goals of care with [facility]. She shares Ms. Seelinger's living will states she would not want life-prolonging measures.  We discussed CODE STATUS and the difference between full code and DO NOT RESUSCITATE. Encouraged family to consider DNR/DNI status understanding evidenced based poor outcomes in similar hospitalized patients, as the cause of the arrest is likely associated with chronic/terminal disease rather than a reversible acute cardio-pulmonary event.  Maudie Mercury shares in the past Ms. Paladino has verbalized wanting to remain full code and Maudie Mercury wishes to continue to honor that at this time but is open to considering DNR in the future.  Family shares Ms. Davoli does not endorse pain, appetite has declined over the last several years but remained stable at this time, sleeps well at home at night but since being in the hospital has a little bit of confusion.  I discussed importance of continued conversations with family/support persons and all members of their medical team regarding overall plan of care and treatment options ensuring decisions are in alignment with patients goals of care.  All questions/concerns addressed. Emotional support provided to patient/family/support persons. PMT will continue to follow and support patient as needed.  1600: Reviewed chart, worsening hypoxia, pleural effusion noted on xray-possible need for thoracentesis  Objective: Primary Diagnoses: Present on Admission:  Multilobar lung infiltrate  Stage 3b chronic kidney disease (HCC)  Mixed hyperlipidemia  Hypomagnesemia  Essential hypertension  Cognitive impairment  Atrial fibrillation with rapid ventricular response (HCC)  Acute hypokalemia   Physical  Exam Constitutional:      General: She is not in acute distress.    Appearance: She is ill-appearing.  Pulmonary:     Effort: Pulmonary effort is normal. No respiratory distress.  Skin:    General: Skin is warm and dry.  Neurological:     Mental Status: She is alert. She is disoriented.     Motor: Weakness present.     Vital Signs: BP (!) 151/93 (BP Location: Right Arm)   Pulse 97   Temp 98.6 F (37 C)   Resp 18   Ht 5\' 3"  (1.6 m)   Wt 54.2 kg   SpO2 94%   BMI 21.17 kg/m  Pain Scale: PAINAD   Pain Score: 0-No pain  IO: Intake/output summary:  Intake/Output Summary (Last 24 hours) at 09/12/2023 1621 Last data filed at 09/12/2023 1048 Gross per 24 hour  Intake 195 ml  Output --  Net 195 ml    LBM: Last BM Date : 09/11/23 Baseline Weight: Weight: 53.6 kg Most recent weight: Weight: 54.2 kg      Assessment and Plan  SUMMARY OF RECOMMENDATIONS   Ongoing GOC discussions PMT to continue to follow for ongoing needs and support  Palliative Prophylaxis:   Bowel Regimen, Delirium Protocol and Frequent Pain Assessment  Thank you for this consult and allowing Palliative Medicine to participate in the care of Chrishauna J. Sedlak. Palliative medicine will continue to follow and assist as needed.   Time Total: 75 minutes  Time spent includes: Detailed review of medical records (labs, imaging, vital signs), medically appropriate exam (mental status, respiratory, cardiac, skin), discussed with treatment team, counseling and educating patient, family and staff, documenting clinical information, medication management and coordination of care.   Signed by: Leeanne Deed, DNP, AGNP-C Palliative Medicine    Please contact Palliative Medicine Team phone at [phone] for questions and concerns.  For individual provider: See Loretha Stapler

## 2023-09-12 NOTE — Progress Notes (Signed)
 Physical Therapy Treatment Patient Details Name: Christine Johns MRN: [id number] DOB: 03/07/1941 Today's Date: 09/12/2023   History of Present Illness Pt is an 83 year old female presenting with AMS, work up includes Multilobar pneumonia due to hyponatremia    PMH significant for atrial fibrillation on Eliquis, currently not on rate control, hypertension, dementia, hyperlipidemia    PT Comments  Pt was long sitting in bed upon arrival on 2 L o2. Supportive daughter present throughout session and is planning to take pt home at DC. Family planning to provide 24/7 assistance at DC. Author recommended [facility] services when cleared medically.  Pt is progressing well towards all PT goals.     If plan is discharge home, recommend the following: A little help with walking and/or transfers;A little help with bathing/dressing/bathroom;Assistance with cooking/housework;Direct supervision/assist for medications management;Direct supervision/assist for financial management;Assist for transportation;Help with stairs or ramp for entrance;Supervision due to cognitive status     Equipment Recommendations  Rolling walker (2 wheels);BSC/3in1       Precautions / Restrictions Precautions Precautions: Fall Recall of Precautions/Restrictions: Impaired Restrictions Weight Bearing Restrictions Per Provider Order: No     Mobility  Bed Mobility Overal bed mobility: Needs Assistance Bed Mobility: Supine to Sit  Supine to sit: Min assist, HOB elevated     Transfers Overall transfer level: Needs assistance Equipment used: Rolling walker (2 wheels), None Transfers: Sit to/from Stand Sit to Stand: Min assist   Ambulation/Gait Ambulation/Gait assistance: Contact guard assist, Min assist Gait Distance (Feet): 30 Feet Assistive device: Rolling walker (2 wheels) Gait Pattern/deviations: Step-through pattern Gait velocity: decreased  General Gait Details: Pt was able to ambulate to doorway of room and then back to  recliner   Balance Overall balance assessment: Needs assistance Sitting-balance support: Bilateral upper extremity supported, Feet supported Sitting balance-Leahy Scale: Fair Sitting balance - Comments: initial posterior LOB that corrected with time   Standing balance support: Bilateral upper extremity supported, Reliant on assistive device for balance Standing balance-Leahy Scale: Fair Standing balance comment: improved balance once standing and holding onto RW. With +1 UE support, severe LOB posteriorly     Communication Communication Communication: No apparent difficulties  Cognition Arousal: Alert Behavior During Therapy: WFL for tasks assessed/performed   PT - Cognitive impairments: History of cognitive impairments    Following commands: Intact      Cueing Cueing Techniques: Verbal cues         Pertinent Vitals/Pain Pain Assessment Pain Assessment: No/denies pain Faces Pain Scale: No hurt     PT Goals (current goals can now be found in the care plan section) Acute Rehab PT Goals Patient Stated Goal: did not state Progress towards PT goals: Progressing toward goals    Frequency    Min 2X/week       AM-PAC PT "6 Clicks" Mobility   Outcome Measure  Help needed turning from your back to your side while in a flat bed without using bedrails?: A Little Help needed moving from lying on your back to sitting on the side of a flat bed without using bedrails?: A Little Help needed moving to and from a bed to a chair (including a wheelchair)?: A Little Help needed standing up from a chair using your arms (e.g., wheelchair or bedside chair)?: A Little Help needed to walk in hospital room?: A Little Help needed climbing 3-5 steps with a railing? : A Lot 6 Click Score: 17    End of Session Equipment Utilized During Treatment: Oxygen (2L) Activity Tolerance:  Patient tolerated treatment well;Patient limited by fatigue Patient left: in chair;with call bell/phone within  reach;with chair alarm set;with family/visitor present Nurse Communication: Mobility status PT Visit Diagnosis: Unsteadiness on feet (R26.81);Muscle weakness (generalized) (M62.81)     Time: 1610-9604 PT Time Calculation (min) (ACUTE ONLY): 19 min  Charges:    $Gait Training: 8-22 mins PT General Charges $$ ACUTE PT VISIT: 1 Visit                     Jetta Lout PTA 09/12/23, 4:22 PM

## 2023-09-12 NOTE — Consult Note (Signed)
 PHARMACY CONSULT NOTE - ELECTROLYTES  Pharmacy Consult for Electrolyte Monitoring and Replacement   Recent Labs: Height: 5\' 3"  (160 cm) Weight: 54.2 kg (119 lb 7.8 oz) IBW/kg (Calculated) : 52.4 Estimated Creatinine Clearance: 25.8 mL/min (A) (by C-G formula based on SCr of 1.32 mg/dL (H)). Potassium (mmol/L)  Date Value  09/12/2023 4.4   Magnesium (mg/dL)  Date Value  24/40/1027 2.1   Calcium (mg/dL)  Date Value  25/36/6440 8.3 (L)   Albumin (g/dL)  Date Value  34/74/2595 3.1 (L)   Phosphorus (mg/dL)  Date Value  63/87/5643 2.7   Sodium (mmol/L)  Date Value  09/12/2023 139    Assessment  Christine Johns is a 83 y.o. female presenting with altered mental status. PMH significant for atrial fibrillation on Eliquis, currently not on rate control, hypertension, dementia, hyperlipidemia. Pharmacy has been consulted to monitor and replace electrolytes.  Received lasix 20 mg IV x 1 on 4/9. Scr trending up.   Goal of Therapy: Electrolytes WNL  Plan:  No replacement needed.  F/u with AM labs.   Thank you for allowing pharmacy to be a part of this patient's care.  Ronnald Ramp, PharmD Clinical Pharmacist 09/12/2023 7:55 AM

## 2023-09-12 NOTE — Progress Notes (Signed)
 Progress Note  Patient Name: Christine Johns Date of Encounter: 09/12/2023  Primary Cardiologist: Wyline Mood  Subjective   No chest pain or palpitations. Dyspnea improving, remains on supplemental oxygen via nasal cannula at 2 L. Daughter at bedside. Some patient confusion noted. BUN/SCr 29/1.25-32/1.32. Potassium 3.3-4.4.  Inpatient Medications    Scheduled Meds:  apixaban  2.5 mg Oral BID   calcium-vitamin D  2 tablet Oral Daily   Chlorhexidine Gluconate Cloth  6 each Topical Daily   cholecalciferol  1,000 Units Oral Daily   feeding supplement  237 mL Oral TID BM   guaiFENesin  600 mg Oral BID   ipratropium-albuterol  3 mL Nebulization BID   metoprolol tartrate  50 mg Oral BID   multivitamin with minerals  1 tablet Oral Daily   pantoprazole  40 mg Oral Daily   pravastatin  40 mg Oral q1800   predniSONE  40 mg Oral Q breakfast   QUEtiapine  12.5 mg Oral QPM   Continuous Infusions:  metronidazole 500 mg (09/12/23 0037)   PRN Meds: acetaminophen **OR** acetaminophen, melatonin, ondansetron **OR** ondansetron (ZOFRAN) IV, mouth rinse, senna-docusate   Vital Signs    Vitals:   09/12/23 0000 09/12/23 0442 09/12/23 0444 09/12/23 0737  BP: (!) 161/103 (!) 150/92  (!) 144/94  Pulse: 98 95  89  Resp: 19 17  18   Temp: 98.2 F (36.8 C) 98 F (36.7 C)  97.6 F (36.4 C)  TempSrc: Oral Oral    SpO2: 96% 95%  99%  Weight:   54.2 kg   Height:        Intake/Output Summary (Last 24 hours) at 09/12/2023 0936 Last data filed at 09/11/2023 2300 Gross per 24 hour  Intake 115 ml  Output --  Net 115 ml   Filed Weights   09/08/23 2200 09/11/23 0500 09/12/23 0444  Weight: 53.6 kg 55.1 kg 54.2 kg    Telemetry    SR with sinus tachycardia, 90s to low 100s bpm - Personally Reviewed  ECG    No new tracings - Personally Reviewed  Physical Exam   GEN: No acute distress.   Neck: No JVD. Cardiac: RRR, no murmurs, rubs, or gallops.  Respiratory: Clear to auscultation bilaterally.  On supplemental oxygen via nasal cannula at 2 L.  GI: Soft, nontender, non-distended.   MS: No edema; No deformity. Neuro:  Alert and oriented x 3; Nonfocal.  Psych: Normal affect.  Labs    Chemistry Recent Labs  Lab 09/07/23 1053 09/08/23 0424 09/10/23 0415 09/11/23 0749 09/12/23 0602  NA 137   < > 136 138 139  K 2.8*   < > 3.3* 3.3* 4.4  CL 107   < > 106 104 106  CO2 18*   < > 23 25 23   GLUCOSE 109*   < > 164* 136* 137*  BUN 17   < > 21 29* 32*  CREATININE 1.23*   < > 1.13* 1.25* 1.32*  CALCIUM 8.0*   < > 7.6* 7.9* 8.3*  PROT 6.4*  --   --   --   --   ALBUMIN 3.1*  --   --   --   --   AST 27  --   --   --   --   ALT 12  --   --   --   --   ALKPHOS 42  --   --   --   --   BILITOT 0.8  --   --   --   --  GFRNONAA 43*   < > 48* 42* 40*  ANIONGAP 12   < > 7 9 10    < > = values in this interval not displayed.     Hematology Recent Labs  Lab 09/10/23 0415 09/11/23 0749 09/12/23 0602  WBC 8.6 11.7* 11.3*  RBC 2.96* 3.15* 3.40*  HGB 8.9* 9.4* 10.2*  HCT 25.1* 27.0* 29.2*  MCV 84.8 85.7 85.9  MCH 30.1 29.8 30.0  MCHC 35.5 34.8 34.9  RDW 13.8 13.9 14.0  PLT 118* 165 197    Cardiac EnzymesNo results for input(s): "TROPONINI" in the last 168 hours. No results for input(s): "TROPIPOC" in the last 168 hours.   BNPNo results for input(s): "BNP", "PROBNP" in the last 168 hours.   DDimer No results for input(s): "DDIMER" in the last 168 hours.   Radiology    No results found.  Cardiac Studies   2D echo 09/09/2023: 1. Left ventricular ejection fraction, by estimation, is 45 to 50%. The  left ventricle has mildly decreased function. The left ventricle  demonstrates global hypokinesis. There is mild asymmetric left ventricular  hypertrophy of the basal-septal segment.  Left ventricular diastolic parameters are consistent with Grade II  diastolic dysfunction (pseudonormalization). Elevated left atrial  pressure.   2. Right ventricular systolic function is normal.  The right ventricular  size is normal.   3. Left atrial size was mildly dilated.   4. The mitral valve is degenerative. Moderate mitral valve regurgitation.  No evidence of mitral stenosis.   5. The aortic valve is tricuspid. There is mild thickening of the aortic  valve. Aortic valve regurgitation is not visualized. Aortic valve  sclerosis is present, with no evidence of aortic valve stenosis.   6. The inferior vena cava is dilated in size with <50% respiratory  variability, suggesting right atrial pressure of 15 mmHg.  __________  2D echo 05/02/2023: 1. Left ventricular ejection fraction, by estimation, is 55 to 60%. The  left ventricle has normal function. The left ventricle has no regional  wall motion abnormalities. Left ventricular diastolic parameters are  consistent with Grade I diastolic  dysfunction (impaired relaxation).   2. Right ventricular systolic function is normal. The right ventricular  size is normal. There is normal pulmonary artery systolic pressure.   3. The mitral valve is grossly normal. Mild mitral valve regurgitation.   4. The aortic valve is tricuspid. Aortic valve regurgitation is not  visualized.   5. The inferior vena cava is normal in size with greater than 50%  respiratory variability, suggesting right atrial pressure of 3 mmHg. __________  2D echo 03/28/2020: 1. Left ventricular ejection fraction, by estimation, is 55 to 60%. The  left ventricle has normal function. The left ventricle has no regional  wall motion abnormalities. Left ventricular diastolic parameters are  indeterminate.   2. Right ventricular systolic function is normal. The right ventricular  size is normal. There is mildly elevated pulmonary artery systolic  pressure.   3. The mitral valve is abnormal. Mild to moderate mitral valve  regurgitation. No evidence of mitral stenosis.   4. The aortic valve has an indeterminant number of cusps. There is mild  calcification of the  aortic valve. There is mild thickening of the aortic  valve. Aortic valve regurgitation is not visualized. Mild to moderate  aortic valve sclerosis/calcification  is present, without any evidence of aortic stenosis.   5. The inferior vena cava is dilated in size with <50% respiratory  variability, suggesting right atrial pressure  of 15 mmHg.    Patient Profile     83 y.o. female with history of PAF, HTN, HLD, CKD stage IIIa, HTN, HLD, normocytic anemia, and dementia who is admitted with acute hypoxic respiratory failure secondary to COPD exacerbation and multilobar pneumonia secondary to metapneumovirus and we are seeing for A-fib with RVR, MAT, and SVT, HFmrEF, and elevated troponin.  Assessment & Plan    1.  PAF/MAT/SVT: - Likely exacerbated by acute pulmonary illness - Burden overall improved - Remains on Lopressor 50 mg twice daily - CHA2DS2-VASc at least 5 (CHF, HTN, age x 2, sex category) - Remains on apixaban 2.5 mg twice daily (age/weight)  2. HFmrEF with moderate mitral regurgitation: - She appears euvolemic and well compensated - EF this admission of 45 to 50%, down from 55 to 60% in 04/2023 - BUN/serum creatinine uptrending following IV Lasix, will continue to hold - Remains on Lopressor 50 mg twice daily - Look to escalate GDMT moving forward as illness improves  3.  Acute hypoxic respiratory failure secondary to COPD exacerbation and multilobar pneumonia secondary to metapneumovirus: - Improving, per IM  4.  Elevated high-sensitivity troponin: - No symptoms of angina or cardiac decompensation - Mildly elevated, trending to 78, likely reflective of supply/demand ischemia in the setting of acute hypoxic respiratory failure secondary to COPD exacerbation and multilobar pneumonia with metapneumovirus and underlying atrial arrhythmia - Echo this admission with mildly reduced EF of 45 to 50% with global hypokinesis - In the setting of lack of anginal symptoms, and acute  illness, no plans for inpatient ischemic evaluation at this time - Consider noninvasive ischemic testing in the outpatient setting  5.  Normocytic anemia: - Stable to improved - No obvious bleed at this time    For questions or updates, please contact [facility] Please consult [email or web address] for contact info under Cardiology/STEMI.    Signed, Eula Listen, PA-C [facility] Pager: [phone] 09/12/2023, 9:36 AM

## 2023-09-13 DIAGNOSIS — E876 Hypokalemia: Secondary | ICD-10-CM

## 2023-09-13 DIAGNOSIS — R918 Other nonspecific abnormal finding of lung field: Secondary | ICD-10-CM | POA: Diagnosis not present

## 2023-09-13 DIAGNOSIS — I4891 Unspecified atrial fibrillation: Secondary | ICD-10-CM | POA: Diagnosis not present

## 2023-09-13 DIAGNOSIS — Z515 Encounter for palliative care: Secondary | ICD-10-CM | POA: Diagnosis not present

## 2023-09-13 LAB — CBC
HCT: 27.3 % — ABNORMAL LOW (ref 36.0–46.0)
Hemoglobin: 9.3 g/dL — ABNORMAL LOW (ref 12.0–15.0)
MCH: 29.7 pg (ref 26.0–34.0)
MCHC: 34.1 g/dL (ref 30.0–36.0)
MCV: 87.2 fL (ref 80.0–100.0)
Platelets: 206 10*3/uL (ref 150–400)
RBC: 3.13 MIL/uL — ABNORMAL LOW (ref 3.87–5.11)
RDW: 14.1 % (ref 11.5–15.5)
WBC: 7.4 10*3/uL (ref 4.0–10.5)
nRBC: 1.1 % — ABNORMAL HIGH (ref 0.0–0.2)

## 2023-09-13 LAB — BASIC METABOLIC PANEL WITH GFR
Anion gap: 10 (ref 5–15)
BUN: 37 mg/dL — ABNORMAL HIGH (ref 8–23)
CO2: 21 mmol/L — ABNORMAL LOW (ref 22–32)
Calcium: 8.2 mg/dL — ABNORMAL LOW (ref 8.9–10.3)
Chloride: 106 mmol/L (ref 98–111)
Creatinine, Ser: 1.39 mg/dL — ABNORMAL HIGH (ref 0.44–1.00)
GFR, Estimated: 37 mL/min — ABNORMAL LOW (ref >60)
Glucose, Bld: 179 mg/dL — ABNORMAL HIGH (ref 70–99)
Potassium: 4.4 mmol/L (ref 3.5–5.1)
Sodium: 137 mmol/L (ref 135–145)

## 2023-09-13 LAB — PHOSPHORUS: Phosphorus: 1.9 mg/dL — ABNORMAL LOW (ref 2.5–4.6)

## 2023-09-13 LAB — MAGNESIUM: Magnesium: 1.7 mg/dL (ref 1.7–2.4)

## 2023-09-13 MED ORDER — GUAIFENESIN-DM 100-10 MG/5ML PO SYRP: 10.0000 mL | ORAL_SOLUTION | ORAL | Status: DC | PRN

## 2023-09-13 MED ORDER — MAGNESIUM SULFATE IN D5W 1-5 GM/100ML-% IV SOLN
1.0000 g | Freq: Once | INTRAVENOUS | Status: AC
  Administered 2023-09-13: 1 g via INTRAVENOUS
  Filled 2023-09-13: qty 100

## 2023-09-13 MED ORDER — K PHOS MONO-SOD PHOS DI & MONO 155-852-130 MG PO TABS
500.0000 mg | ORAL_TABLET | Freq: Once | ORAL | Status: AC
  Administered 2023-09-13: 500 mg via ORAL
  Filled 2023-09-13: qty 2

## 2023-09-13 MED ORDER — IPRATROPIUM-ALBUTEROL 0.5-2.5 (3) MG/3ML IN SOLN: 3.0000 mL | Freq: Four times a day (QID) | RESPIRATORY_TRACT | Status: DC | PRN

## 2023-09-13 NOTE — Progress Notes (Signed)
                                                                                                                                                                                                           Daily Progress Note   Patient Name: Christine Johns       Date: 09/13/2023 DOB: 23-Nov-1940  Age: 83 y.o. MRN#: [id number] Attending Physician: Althia Atlas, MD Primary Care Physician: Monique Ano, MD Admit Date: 09/07/2023  Reason for Consultation/Follow-up: Establishing goals of care  HPI/Brief Hospital Review: 83 y.o. female  with past medical history of A-fib on Eliquis, hypertension, dementia, hyperlipidemia admitted from home on 09/07/2023 with altered mental status.   Found to have multilobar pneumonia due to metapneumovirus-has completed antibiotic course   Palliative medicine was consulted for assisting with goals of care conversations.  Subjective: Extensive chart review has been completed prior to meeting patient including labs, vital signs, imaging, progress notes, orders, and available advanced directive documents from current and previous encounters.    Visited with Christine Johns at her bedside.  She is awake, alert, unable to appropriately answer orientation questions.  She denies acute pain or discomfort and reports feeling as though her breathing has improved.  Sister initially at bedside who stayed overnight and reports an uneventful evening.  Returned to bedside later in the morning once daughter visiting.  Reviewed conversations had with daughter yesterday.  Daughter aware of concern for pleural effusion and need of thoracentesis but with hopes that fluid will resolve and intervention can be avoided.  We discussed potential risks and complications associated with thoracentesis.  On assessment it appears Christine Johns's shortness of breath and work of breathing has somewhat improved compared to yesterday according to chart documentation.  Answered and addressed all questions and  concerns.  PMT to continue to follow for ongoing needs and support.  Thank you for allowing the Palliative Medicine Team to assist in the care of this patient.  Total time:  35 minutes  Time spent includes: Detailed review of medical records (labs, imaging, vital signs), medically appropriate exam (mental status, respiratory, cardiac, skin), discussed with treatment team, counseling and educating patient, family and staff, documenting clinical information, medication management and coordination of care.  Isadore Marble, DNP, AGNP-C Palliative Medicine   Please contact Palliative Medicine Team phone at [phone] for questions and concerns.

## 2023-09-13 NOTE — Plan of Care (Signed)

## 2023-09-13 NOTE — Plan of Care (Signed)
 Pt confused, family at bedside; med taken. Continue to monitor. Problem: Education: Goal: Knowledge of General Education information will improve Description: Including pain rating scale, medication(s)/side effects and non-pharmacologic comfort measures Outcome: Progressing   Problem: Health Behavior/Discharge Planning: Goal: Ability to manage health-related needs will improve Outcome: Progressing   Problem: Clinical Measurements: Goal: Ability to maintain clinical measurements within normal limits will improve Outcome: Progressing Goal: Will remain free from infection Outcome: Progressing Goal: Diagnostic test results will improve Outcome: Progressing Goal: Respiratory complications will improve Outcome: Progressing Goal: Cardiovascular complication will be avoided Outcome: Progressing   Problem: Activity: Goal: Risk for activity intolerance will decrease Outcome: Progressing   Problem: Nutrition: Goal: Adequate nutrition will be maintained Outcome: Progressing   Problem: Coping: Goal: Level of anxiety will decrease Outcome: Progressing   Problem: Elimination: Goal: Will not experience complications related to bowel motility Outcome: Progressing Goal: Will not experience complications related to urinary retention Outcome: Progressing   Problem: Pain Managment: Goal: General experience of comfort will improve and/or be controlled Outcome: Progressing   Problem: Safety: Goal: Ability to remain free from injury will improve Outcome: Progressing   Problem: Skin Integrity: Goal: Risk for impaired skin integrity will decrease Outcome: Progressing

## 2023-09-13 NOTE — Progress Notes (Addendum)
 [facility] Progress Note  Patient: Christine Johns    ZOX:[id number]  DOA: 09/07/2023     Date of Service: the patient was seen and examined on 09/13/2023  Chief Complaint  Patient presents with   Altered Mental Status   Brief hospital course: Ms. Christine Johns is an 83 year old female with history of atrial fibrillation on Eliquis, currently not on rate control, hypertension, dementia, hyperlipidemia, who presents emergency department for chief concerns of altered mental status.   Patient presented via EMS and per nursing documentation, patient received sodium chloride 500 mL liter bolus per EMS.   Vitals in the ED showed temperature 99, respiration rate 29, improved to 22, heart rate initially 50, currently 80, and on EKG showed atrial fibrillation with rate of 129, QTc 552.  Blood pressure is 159/98, SpO2 95% on room air.   Serum sodium is 135, potassium 2.8, chloride 107, bicarb 18, BUN of 17, serum creatinine 1.27, EGFR 43, nonfasting blood glucose 109, WBC 4.5, hemoglobin 11.2, platelets of 127.   High-sensitivity troponin 65.  Lactic acid 1.6.  Magnesium 1.2.  COVID/influenza A/influenza B/RSV PCR were negative.   Blood cultures x 2 are in process.   ED treatment: Doxycycline 100 mg p.o. one-time dose, diltiazem 10 mg x 2 doses, potassium chloride 40 mEq p.o. one-time dose, ceftriaxone 2 g IV one-time dose, magnesium 2 g IV one-time dose, sodium chloride 1 L bolus, diltiazem gtt. initiated.   Assessment and Plan:  # Multilobar pneumonia due to metapneumovirus S/p Azithromycin 500 mg IV daily and Ceftriaxone 2 g IV daily, completed 5-day course.   S/p NS 1 L bolus ordered by EDP and LR 500 mL bolus on admission Incentive spirometry, flutter valve Oxygen supplementation as needed to maintain SpO2 greater than 92% 4/7 had an episode of aspiration and hypoxia, suction was done, hypoxia improved, patient did not require intubation. Added Flagyl to cover anaerobes, patient is  allergic to penicillin RVP positive for metapneumovirus, continue droplet precautions S/p Solu-Medrol 40 mg IV twice daily x 3 doses followed by prednisone 40 mg p.o. daily for 4 days Continue Flagyl 500 twice daily for total 5 days. 4/11 CXR: Shows pulmonary edema and pleural effusion Discussed with the patient's daughter, she does not want to move forward for thoracentesis at this time.  We will continue to monitor. Patient's daughter decided late in the evening for thoracentesis on 4/11 which could not be done. Patient may get thoracentesis done on Monday 4/14, we will repeat chest x-ray before thoracentesis.  # Multifocal atrial tachycardia's/SVT H/o Atrial fibrillation  Home Eliquis resumed on admission Patient currently not on a rate control medication S/p diltiazem GGT DC'd on 4/8 S/p Lopressor 10 mg IV 1 dose given, s/p Lopressor 50 BID 4/8 BP soft, cardiology recommended NS 500 mL bolus 4/9 changed to Lopressor 50 mg p.o. 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  Moderate MR 4/9 Lasix 20 mg x 1 dose given.  No immediate plan for ischemic workup, this can be done when respiratory status improves. Follow cardiology 4/10 creatinine 1.25 slightly elevated, hold off Lasix for now as per cardio 4/12 Cr 1.39 elevated, hold off Lasix for now   Essential hypertension Home lisinopril 10 mg pod, Dc'd 4/7 s/p Lopressor 50 mg p.o. BID due  to A-fib with RVR 4/9 started Lopressor 50 mg p.o. twice daily  # Acute gastroenteritis, most likely due to virus infection Diarrhea resolved Monitor electrolytes   # Metabolic acidosis: Repleted bicarb IV.  Resolved Monitor electrolytes and replete as needed  Stage 3b chronic kidney  disease Cr 1.13---1.25--1.39 elevated Continue to monitor  Mixed hyperlipidemia Lovastatin equivalent, pravastatin 40 mg nightly ordered   History of normocytic normochromic anemia At baseline   # Hyponatremia, monitor sodium level daily # Hypophosphatemia, Phos repleted. # Hypomagnesemia: Mag repleted. Recheck magnesium level in the morning   # Acute hypokalemia due to diarrhea Status post potassium chloride 40 mEq p.o. one-time dose, potassium chloride 10 mill equivalent, 2 doses ordered by EDP LR 500 mL liter bolus on admission Monitor electrolytes 4/7 potassium 2.9, repleted IV 4/10 K3.3, repleted orally  Acute delirium,  s/p Seroquel 50 mg x 1 dose given 4/8, patient was sleepy so decreased dose of Seroquel 12.5 mg p.o. twice daily 4/9 decreased Seroquel 12.5 mg p.o. every evening  Body mass index is 21.48 kg/m.  Interventions:  Diet: Regular diet DVT Prophylaxis: Therapeutic Anticoagulation with Eliquis    Advance goals of care discussion: Full code  Family Communication: family was present at bedside, at the time of interview.  The pt provided permission to discuss medical plan with the family. Opportunity was given to ask question and all questions were answered satisfactorily.  Patient has dementia AO x 1 4/12 discussed with patient's daughter and sister at bedside    Disposition:  Pt is from home, admitted with respiratory failure due to pneumonia, A-fib with RVR, diarrhea, hypokalemia, still patient is not stable to discharge, may need few days to improve. Discharge to home, when stable, most likely on Monday due to worsening of shortness of breath and developed pleural effusion, may need thoracentesis on Monday  Subjective: No significant events overnight, patient is resting comfortably, denied any worsening of shortness of breath, still requiring supplemental O2 admission on 2 L oxygen via nasal cannula.  No chest pain or palpitations.  No any other active  issues.    Physical Exam: General: NAD, laying in the bed with mild SOB Eyes: PERRLA ENT: Oral Mucosa Clear, moist  Neck: no JVD,  Cardiovascular: Irregular rhythm, no Murmur,  Respiratory: Equal air entry bilaterally, bibasilar crackles and decreased breath sounds in the bibasilar area, no significant wheezes Abdomen: Bowel Sound present, Soft and no tenderness,  Skin: no rashes Extremities: no Pedal edema, no calf tenderness Neurologic: without any new focal findings Gait not checked due to patient safety concerns  Vitals:   09/13/23 0442 09/13/23 0500 09/13/23 0840 09/13/23 1241  BP: 139/82  137/81 (!) 147/84  Pulse: 95  61 88  Resp: 18     Temp: 98.4 F (36.9 C)  98.8 F (37.1 C) 98.1 F (36.7 C)  TempSrc: Oral  Oral Oral  SpO2: 98%  96% 98%  Weight:  55 kg    Height:        Intake/Output Summary (Last 24 hours) at 09/13/2023 1630 Last data filed at 09/13/2023 7829 Gross per 24 hour  Intake 120 ml  Output 300 ml  Net -180 ml   Filed Weights   09/11/23 0500 09/12/23 0444 09/13/23 0500  Weight: 55.1 kg 54.2 kg 55 kg    Data Reviewed: I have personally reviewed and interpreted daily labs, tele strips, imagings as discussed above. I reviewed all nursing notes, pharmacy notes, vitals, pertinent old records I have discussed plan of  care as described above with RN and patient/family.  CBC: Recent Labs  Lab 09/07/23 1053 09/08/23 0424 09/09/23 0344 09/10/23 0415 09/11/23 0749 09/12/23 0602 09/13/23 0404  WBC 4.5   < > 11.4* 8.6 11.7* 11.3* 7.4  NEUTROABS 3.3  --   --   --   --   --   --   HGB 11.1*   < > 10.8* 8.9* 9.4* 10.2* 9.3*  HCT 34.1*   < > 30.8* 25.1* 27.0* 29.2* 27.3*  MCV 93.7   < > 86.3 84.8 85.7 85.9 87.2  PLT 127*   < > 123* 118* 165 197 206   < > = values in this interval not displayed.   Basic Metabolic Panel: Recent Labs  Lab 09/09/23 0344 09/09/23 1830 09/10/23 0415 09/11/23 0749 09/12/23 0602 09/13/23 0404  NA 132* 138 136 138  139 137  K 3.6 3.4* 3.3* 3.3* 4.4 4.4  CL 104 106 106 104 106 106  CO2 19* 20* 23 25 23  21*  GLUCOSE 175* 218* 164* 136* 137* 179*  BUN 16 18 21  29* 32* 37*  CREATININE 1.10* 1.24* 1.13* 1.25* 1.32* 1.39*  CALCIUM 7.8* 7.8* 7.6* 7.9* 8.3* 8.2*  MG 1.3* 2.9* 2.6* 2.1  --  1.7  PHOS 2.0* 3.8 2.8 2.7  --  1.9*    Studies: No results found.     Scheduled Meds:  apixaban  2.5 mg Oral BID   arformoterol  15 mcg Nebulization BID   budesonide (PULMICORT) nebulizer solution  0.25 mg Nebulization BID   calcium-vitamin D  2 tablet Oral Daily   Chlorhexidine Gluconate Cloth  6 each Topical Daily   cholecalciferol  1,000 Units Oral Daily   feeding supplement  237 mL Oral TID BM   guaiFENesin  600 mg Oral BID   ipratropium-albuterol  3 mL Nebulization BID   metoprolol tartrate  50 mg Oral BID   multivitamin with minerals  1 tablet Oral Daily   pantoprazole  40 mg Oral Daily   pravastatin  40 mg Oral q1800   QUEtiapine  12.5 mg Oral QPM   Continuous Infusions:   PRN Meds: guaiFENesin-dextromethorphan, melatonin, mouth rinse, senna-docusate  Time spent: 40 minutes  Author: Althia Atlas. MD [facility] 09/13/2023 4:30 PM  To reach On-call, see care teams to locate the attending and reach out to them via [email or web address]. If 7PM-7AM, please contact night-coverage If you still have difficulty reaching the attending provider, please page the [facility] (Director on Call) for [facility] on amion for assistance.

## 2023-09-13 NOTE — Consult Note (Signed)
 PHARMACY CONSULT NOTE - ELECTROLYTES  Pharmacy Consult for Electrolyte Monitoring and Replacement   Recent Labs: Height: 5\' 3"  (160 cm) Weight: 55 kg (121 lb 4.1 oz) IBW/kg (Calculated) : 52.4 Estimated Creatinine Clearance: 24.5 mL/min (A) (by C-G formula based on SCr of 1.39 mg/dL (H)). Potassium (mmol/L)  Date Value  09/13/2023 4.4   Magnesium (mg/dL)  Date Value  40/98/1191 1.7   Calcium (mg/dL)  Date Value  47/82/9562 8.2 (L)   Albumin (g/dL)  Date Value  13/01/6577 3.1 (L)   Phosphorus (mg/dL)  Date Value  46/96/2952 1.9 (L)   Sodium (mmol/L)  Date Value  09/13/2023 137    Assessment  Christine Johns is a 83 y.o. female presenting with altered mental status. PMH significant for atrial fibrillation on Eliquis, currently not on rate control, hypertension, dementia, hyperlipidemia. Pharmacy has been consulted to monitor and replace electrolytes. On calcium w/D 2 tabs daily Scr trending up.   Goal of Therapy: Electrolytes WNL (Mag >2.0 and K .4.0  with Afib)  Plan:  Mag 1.7 Scr 1.39   Will order Magnesium sulfate 1 gram IV x 1 (conservative due to renal fxn, Crcl 24.5 ml/min) Phos 1.9  K 4.4  Na 137   Will order K-Phos Neutral 500mg  po x 1  F/u with AM labs.   Thank you for allowing pharmacy to be a part of this patient's care.  Scotty Cyphers, PharmD Clinical Pharmacist 09/13/2023 7:20 AM

## 2023-09-14 ENCOUNTER — Inpatient Hospital Stay

## 2023-09-14 DIAGNOSIS — Z515 Encounter for palliative care: Secondary | ICD-10-CM | POA: Diagnosis not present

## 2023-09-14 DIAGNOSIS — I4891 Unspecified atrial fibrillation: Secondary | ICD-10-CM | POA: Diagnosis not present

## 2023-09-14 DIAGNOSIS — R918 Other nonspecific abnormal finding of lung field: Secondary | ICD-10-CM | POA: Diagnosis not present

## 2023-09-14 LAB — CBC
HCT: 30.1 % — ABNORMAL LOW (ref 36.0–46.0)
Hemoglobin: 10.5 g/dL — ABNORMAL LOW (ref 12.0–15.0)
MCH: 30.5 pg (ref 26.0–34.0)
MCHC: 34.9 g/dL (ref 30.0–36.0)
MCV: 87.5 fL (ref 80.0–100.0)
Platelets: 233 10*3/uL (ref 150–400)
RBC: 3.44 MIL/uL — ABNORMAL LOW (ref 3.87–5.11)
RDW: 14 % (ref 11.5–15.5)
WBC: 8.8 10*3/uL (ref 4.0–10.5)
nRBC: 2.1 % — ABNORMAL HIGH (ref 0.0–0.2)

## 2023-09-14 LAB — BASIC METABOLIC PANEL WITH GFR
Anion gap: 7 (ref 5–15)
BUN: 37 mg/dL — ABNORMAL HIGH (ref 8–23)
CO2: 24 mmol/L (ref 22–32)
Calcium: 8.6 mg/dL — ABNORMAL LOW (ref 8.9–10.3)
Chloride: 105 mmol/L (ref 98–111)
Creatinine, Ser: 1.35 mg/dL — ABNORMAL HIGH (ref 0.44–1.00)
GFR, Estimated: 39 mL/min — ABNORMAL LOW (ref >60)
Glucose, Bld: 122 mg/dL — ABNORMAL HIGH (ref 70–99)
Potassium: 4 mmol/L (ref 3.5–5.1)
Sodium: 136 mmol/L (ref 135–145)

## 2023-09-14 LAB — MAGNESIUM: Magnesium: 1.7 mg/dL (ref 1.7–2.4)

## 2023-09-14 LAB — PHOSPHORUS: Phosphorus: 3.9 mg/dL (ref 2.5–4.6)

## 2023-09-14 MED ORDER — MAGNESIUM SULFATE 2 GM/50ML IV SOLN
2.0000 g | Freq: Once | INTRAVENOUS | Status: AC
  Administered 2023-09-14: 2 g via INTRAVENOUS
  Filled 2023-09-14: qty 50

## 2023-09-14 NOTE — Consult Note (Signed)
 PHARMACY CONSULT NOTE - ELECTROLYTES  Pharmacy Consult for Electrolyte Monitoring and Replacement   Recent Labs: Height: 5\' 3"  (160 cm) Weight: 53.4 kg (117 lb 11.6 oz) IBW/kg (Calculated) : 52.4 Estimated Creatinine Clearance: 25.2 mL/min (A) (by C-G formula based on SCr of 1.35 mg/dL (H)). Potassium (mmol/L)  Date Value  09/14/2023 4.0   Magnesium (mg/dL)  Date Value  16/03/9603 1.7   Calcium (mg/dL)  Date Value  54/02/8118 8.6 (L)   Albumin (g/dL)  Date Value  14/78/2956 3.1 (L)   Phosphorus (mg/dL)  Date Value  21/30/8657 3.9   Sodium (mmol/L)  Date Value  09/14/2023 136    Assessment  Christine Johns is a 83 y.o. female presenting with altered mental status. PMH significant for atrial fibrillation on Eliquis, currently not on rate control, hypertension, dementia, hyperlipidemia. Pharmacy has been consulted to monitor and replace electrolytes. On calcium w/D 2 tabs daily Scr trending up.   Goal of Therapy: Electrolytes WNL (Goal Mag >2.0 and K .4.0  with Afib)  Plan:  Mag 1.7 >> 1.7  after Magnesium 1 gram IV yesterday 4/12   --  Will order Magnesium sulfate 2 gram IV x 1 today  (Goal Magnesium >2.0 with Atrial fibrillation) F/u with AM labs.   Thank you for allowing pharmacy to be a part of this patient's care.  Scotty Cyphers, PharmD Clinical Pharmacist 09/14/2023 8:39 AM

## 2023-09-14 NOTE — Plan of Care (Signed)

## 2023-09-14 NOTE — Progress Notes (Signed)
                                                                                                                                                                                                           Daily Progress Note   Patient Name: Christine Johns      Date: 09/14/2023 DOB: 08-31-1940  Age: 83 y.o. MRN#: [id number] Attending Physician: Althia Atlas, MD Primary Care Physician: Monique Ano, MD Admit Date: 09/07/2023  Reason for Consultation/Follow-up: Establishing goals of care  HPI/Brief Hospital Review: 83 y.o. female  with past medical history of A-fib on Eliquis, hypertension, dementia, hyperlipidemia admitted from home on 09/07/2023 with altered mental status.   Found to have multilobar pneumonia due to metapneumovirus-has completed antibiotic course   Palliative medicine was consulted for assisting with goals of care conversations.  Subjective: Extensive chart review has been completed prior to meeting patient including labs, vital signs, imaging, progress notes, orders, and available advanced directive documents from current and previous encounters.    Visited with Christine Johns at her bedside.  She is awake, alert, oriented to self only.  She denies acute pain or discomfort.  Reports improvement in her breathing with visible improvement in work of breathing and shortness of breath.  Daughter and other family members visiting at bedside.  Daughter remains aware of current medical condition and current plan of care.  She is hopeful that with improvement in respiratory status Christine Johns will not require thoracentesis tomorrow but remains open to procedure if warranted.  Goals and plans remain clear at this time with daughter.  Answered and addressed all questions and concerns.  PMT to step away from daily visits but will remain available if needs or concerns arise, please reengage if goals or plans change.  Thank you for allowing the Palliative Medicine Team to assist in the care of  this patient.  Total time:  25 minutes  Time spent includes: Detailed review of medical records (labs, imaging, vital signs), medically appropriate exam (mental status, respiratory, cardiac, skin), discussed with treatment team, counseling and educating patient, family and staff, documenting clinical information, medication management and coordination of care.  Isadore Marble, DNP, AGNP-C Palliative Medicine   Please contact Palliative Medicine Team phone at [phone] for questions and concerns.

## 2023-09-14 NOTE — Progress Notes (Signed)
 [facility] Progress Note  Patient: Christine Johns    WUX:[id number]  DOA: 09/07/2023     Date of Service: the patient was seen and examined on 09/14/2023  Chief Complaint  Patient presents with   Altered Mental Status   Brief hospital course: Ms. Christine Johns is an 83 year old female with history of atrial fibrillation on Eliquis, currently not on rate control, hypertension, dementia, hyperlipidemia, who presents emergency department for chief concerns of altered mental status.   Patient presented via EMS and per nursing documentation, patient received sodium chloride 500 mL liter bolus per EMS.   Vitals in the ED showed temperature 99, respiration rate 29, improved to 22, heart rate initially 50, currently 80, and on EKG showed atrial fibrillation with rate of 129, QTc 552.  Blood pressure is 159/98, SpO2 95% on room air.   Serum sodium is 135, potassium 2.8, chloride 107, bicarb 18, BUN of 17, serum creatinine 1.27, EGFR 43, nonfasting blood glucose 109, WBC 4.5, hemoglobin 11.2, platelets of 127.   High-sensitivity troponin 65.  Lactic acid 1.6.  Magnesium 1.2.  COVID/influenza A/influenza B/RSV PCR were negative.   Blood cultures x 2 are in process.   ED treatment: Doxycycline 100 mg p.o. one-time dose, diltiazem 10 mg x 2 doses, potassium chloride 40 mEq p.o. one-time dose, ceftriaxone 2 g IV one-time dose, magnesium 2 g IV one-time dose, sodium chloride 1 L bolus, diltiazem gtt. initiated.   Assessment and Plan:  # Multilobar pneumonia due to metapneumovirus S/p Azithromycin 500 mg IV daily and Ceftriaxone 2 g IV daily, completed 5-day course.   S/p NS 1 L bolus ordered by EDP and LR 500 mL bolus on admission Incentive spirometry, flutter valve Oxygen supplementation as needed to maintain SpO2 greater than 92% 4/7 had an episode of aspiration and hypoxia, suction was done, hypoxia improved, patient did not require intubation. Added Flagyl to cover anaerobes, patient is  allergic to penicillin RVP positive for metapneumovirus, continue droplet precautions S/p Solu-Medrol 40 mg IV twice daily x 3 doses followed by prednisone 40 mg p.o. daily for 4 days Continue Flagyl 500 twice daily for total 5 days. 4/11 CXR: Shows pulmonary edema and pleural effusion Discussed with the patient's daughter, she does not want to move forward for thoracentesis at this time.  We will continue to monitor. Patient's daughter decided late in the evening for thoracentesis on 4/11 which could not be done. Patient may get thoracentesis done on Monday 4/14, we will repeat chest x-ray before thoracentesis.  # Multifocal atrial tachycardia's/SVT H/o Atrial fibrillation  Home Eliquis resumed on admission Patient currently not on a rate control medication S/p diltiazem GGT DC'd on 4/8 S/p Lopressor 10 mg IV 1 dose given, s/p Lopressor 50 BID 4/8 BP soft, cardiology recommended NS 500 mL bolus 4/9 changed to Lopressor 50 mg p.o. 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  Moderate MR 4/9 Lasix 20 mg x 1 dose given.  No immediate plan for ischemic workup, this can be done when respiratory status improves. Follow cardiology 4/10 creatinine 1.25 slightly elevated, hold off Lasix for now as per cardio 4/13 Cr 1.35 elevated, hold off Lasix for now   # Essential hypertension Home lisinopril 10 mg pod, Dc'd 4/7 s/p Lopressor 50 mg p.o.  BID due to A-fib with RVR 4/9 started Lopressor 50 mg p.o. twice daily  # Acute gastroenteritis, most likely due to virus infection Diarrhea resolved Monitor electrolytes   # Metabolic acidosis: Repleted bicarb IV.  Resolved Monitor electrolytes and replete as needed  # Stage 3b chronic  kidney disease Cr 1.13---1.25--1.33 elevated Continue to monitor  # Mixed hyperlipidemia Lovastatin equivalent, pravastatin 40 mg nightly ordered   # History of normocytic normochromic anemia At baseline   # Hyponatremia, monitor sodium level daily # Hypophosphatemia, Phos repleted. # Hypomagnesemia: Mag repleted. Recheck magnesium level in the morning   # Acute hypokalemia due to diarrhea. Resolved  Status post potassium chloride 40 mEq p.o. one-time dose, potassium chloride 10 mill equivalent, 2 doses ordered by EDP LR 500 mL liter bolus on admission Monitor electrolytes 4/7 potassium 2.9, repleted IV 4/10 K3.3, repleted orally  # Acute delirium,  s/p Seroquel 50 mg x 1 dose given 4/8, patient was sleepy so decreased dose of Seroquel 12.5 mg p.o. twice daily 4/9 decreased Seroquel 12.5 mg p.o. every evening  Body mass index is 20.85 kg/m.  Interventions:  Diet: Regular diet DVT Prophylaxis: Therapeutic Anticoagulation with Eliquis    Advance goals of care discussion: Full code  Family Communication: family was present at bedside, at the time of interview.  The pt provided permission to discuss medical plan with the family. Opportunity was given to ask question and all questions were answered satisfactorily.  Patient has dementia AO x 1 4/13 discussed with patient's daughter and sister at bedside    Disposition:  Pt is from home, admitted with respiratory failure due to pneumonia, A-fib with RVR, diarrhea, hypokalemia, still patient is not stable to discharge, may need few days to improve. Discharge to home, when stable, may need thoracentesis tomorrow a.m.  Discharge planning on Tuesday if remains stable.   Subjective: No significant events overnight, still has shortness of breath, using 22 oxygen via nasal cannula.  Denied any other complaints.  No chest pain or palpitations.    Physical Exam: General: NAD, laying in the bed with mild SOB Eyes: PERRLA ENT: Oral  Mucosa Clear, moist  Neck: no JVD,  Cardiovascular: Irregular rhythm, no Murmur,  Respiratory: Equal air entry bilaterally, bibasilar crackles and decreased breath sounds in the bibasilar area, no significant wheezes Abdomen: Bowel Sound present, Soft and no tenderness,  Skin: no rashes Extremities: no Pedal edema, no calf tenderness Neurologic: without any new focal findings Gait not checked due to patient safety concerns  Vitals:   09/14/23 0358 09/14/23 0500 09/14/23 0802 09/14/23 1114  BP: (!) 150/98  (!) 149/85 133/77  Pulse:   (!) 51 90  Resp:   18 17  Temp:   97.9 F (36.6 C) (!) 97.5 F (36.4 C)  TempSrc:    Oral  SpO2:   99% 97%  Weight:  53.4 kg    Height:        Intake/Output Summary (Last 24 hours) at 09/14/2023 1453 Last data filed at 09/14/2023 1000 Gross per 24 hour  Intake 49.37 ml  Output 900 ml  Net -850.63 ml   Filed Weights   09/12/23 0444 09/13/23 0500 09/14/23 0500  Weight: 54.2 kg 55 kg 53.4 kg    Data Reviewed: I have personally reviewed and interpreted daily labs, tele strips, imagings as discussed above. I reviewed all nursing notes, pharmacy notes, vitals, pertinent old records I have discussed plan of care as described above with RN and patient/family.  CBC: Recent  Labs  Lab 09/10/23 0415 09/11/23 0749 09/12/23 0602 09/13/23 0404 09/14/23 0448  WBC 8.6 11.7* 11.3* 7.4 8.8  HGB 8.9* 9.4* 10.2* 9.3* 10.5*  HCT 25.1* 27.0* 29.2* 27.3* 30.1*  MCV 84.8 85.7 85.9 87.2 87.5  PLT 118* 165 197 206 233   Basic Metabolic Panel: Recent Labs  Lab 09/09/23 1830 09/10/23 0415 09/11/23 0749 09/12/23 0602 09/13/23 0404 09/14/23 0448  NA 138 136 138 139 137 136  K 3.4* 3.3* 3.3* 4.4 4.4 4.0  CL 106 106 104 106 106 105  CO2 20* 23 25 23  21* 24  GLUCOSE 218* 164* 136* 137* 179* 122*  BUN 18 21 29* 32* 37* 37*  CREATININE 1.24* 1.13* 1.25* 1.32* 1.39* 1.35*  CALCIUM 7.8* 7.6* 7.9* 8.3* 8.2* 8.6*  MG 2.9* 2.6* 2.1  --  1.7 1.7  PHOS 3.8 2.8  2.7  --  1.9* 3.9    Studies: No results found.     Scheduled Meds:  apixaban  2.5 mg Oral BID   arformoterol  15 mcg Nebulization BID   budesonide (PULMICORT) nebulizer solution  0.25 mg Nebulization BID   calcium-vitamin D  2 tablet Oral Daily   Chlorhexidine Gluconate Cloth  6 each Topical Daily   cholecalciferol  1,000 Units Oral Daily   feeding supplement  237 mL Oral TID BM   guaiFENesin  600 mg Oral BID   metoprolol tartrate  50 mg Oral BID   multivitamin with minerals  1 tablet Oral Daily   pantoprazole  40 mg Oral Daily   pravastatin  40 mg Oral q1800   QUEtiapine  12.5 mg Oral QPM   Continuous Infusions:   PRN Meds: guaiFENesin-dextromethorphan, ipratropium-albuterol, melatonin, mouth rinse, senna-docusate  Time spent: 40 minutes  Author: Althia Atlas. MD [facility] 09/14/2023 2:53 PM  To reach On-call, see care teams to locate the attending and reach out to them via [email or web address]. If 7PM-7AM, please contact night-coverage If you still have difficulty reaching the attending provider, please page the [facility] (Director on Call) for [facility] on amion for assistance.

## 2023-09-15 ENCOUNTER — Inpatient Hospital Stay

## 2023-09-15 DIAGNOSIS — R918 Other nonspecific abnormal finding of lung field: Secondary | ICD-10-CM | POA: Diagnosis not present

## 2023-09-15 LAB — BASIC METABOLIC PANEL WITH GFR
Anion gap: 10 (ref 5–15)
BUN: 40 mg/dL — ABNORMAL HIGH (ref 8–23)
CO2: 23 mmol/L (ref 22–32)
Calcium: 8.3 mg/dL — ABNORMAL LOW (ref 8.9–10.3)
Chloride: 102 mmol/L (ref 98–111)
Creatinine, Ser: 1.36 mg/dL — ABNORMAL HIGH (ref 0.44–1.00)
GFR, Estimated: 38 mL/min — ABNORMAL LOW (ref >60)
Glucose, Bld: 92 mg/dL (ref 70–99)
Potassium: 3.6 mmol/L (ref 3.5–5.1)
Sodium: 135 mmol/L (ref 135–145)

## 2023-09-15 LAB — PROTEIN, PLEURAL OR PERITONEAL FLUID: Total protein, fluid: <3 g/dL

## 2023-09-15 LAB — BODY FLUID CELL COUNT WITH DIFFERENTIAL
Eos, Fluid: 0 %
Lymphs, Fluid: 37 %
Monocyte-Macrophage-Serous Fluid: 14 %
Neutrophil Count, Fluid: 49 %
Total Nucleated Cell Count, Fluid: 193 uL

## 2023-09-15 LAB — LACTATE DEHYDROGENASE, PLEURAL OR PERITONEAL FLUID: LD, Fluid: 59 U/L — ABNORMAL HIGH (ref 3–23)

## 2023-09-15 LAB — CBC
HCT: 31.9 % — ABNORMAL LOW (ref 36.0–46.0)
Hemoglobin: 10.8 g/dL — ABNORMAL LOW (ref 12.0–15.0)
MCH: 29.3 pg (ref 26.0–34.0)
MCHC: 33.9 g/dL (ref 30.0–36.0)
MCV: 86.7 fL (ref 80.0–100.0)
Platelets: 269 10*3/uL (ref 150–400)
RBC: 3.68 MIL/uL — ABNORMAL LOW (ref 3.87–5.11)
RDW: 13.9 % (ref 11.5–15.5)
WBC: 9.7 10*3/uL (ref 4.0–10.5)
nRBC: 1.4 % — ABNORMAL HIGH (ref 0.0–0.2)

## 2023-09-15 LAB — PATHOLOGIST SMEAR REVIEW

## 2023-09-15 LAB — MAGNESIUM: Magnesium: 1.7 mg/dL (ref 1.7–2.4)

## 2023-09-15 LAB — PHOSPHORUS: Phosphorus: 2.7 mg/dL (ref 2.5–4.6)

## 2023-09-15 MED ORDER — LIDOCAINE HCL (PF) 1 % IJ SOLN
10.0000 mL | Freq: Once | INTRAMUSCULAR | Status: AC
  Administered 2023-09-15: 10 mL via INTRADERMAL
  Filled 2023-09-15: qty 10

## 2023-09-15 MED ORDER — POTASSIUM CHLORIDE CRYS ER 20 MEQ PO TBCR
20.0000 meq | EXTENDED_RELEASE_TABLET | Freq: Once | ORAL | Status: AC
  Administered 2023-09-15: 20 meq via ORAL
  Filled 2023-09-15: qty 1

## 2023-09-15 MED ORDER — MAGNESIUM SULFATE 2 GM/50ML IV SOLN
2.0000 g | Freq: Once | INTRAVENOUS | Status: AC
  Administered 2023-09-15: 2 g via INTRAVENOUS
  Filled 2023-09-15: qty 50

## 2023-09-15 NOTE — Progress Notes (Signed)
 [facility] Progress Note  Patient: Christine Johns    ZOX:[id number]  DOA: 09/07/2023     Date of Service: the patient was seen and examined on 09/15/2023  Chief Complaint  Patient presents with   Altered Mental Status   Brief hospital course: Christine Johns is an 83 year old female with history of atrial fibrillation on Eliquis, currently not on rate control, hypertension, dementia, hyperlipidemia, who presents emergency department for chief concerns of altered mental status.   Patient presented via EMS and per nursing documentation, patient received sodium chloride 500 mL liter bolus per EMS.   Vitals in the ED showed temperature 99, respiration rate 29, improved to 22, heart rate initially 50, currently 80, and on EKG showed atrial fibrillation with rate of 129, QTc 552.  Blood pressure is 159/98, SpO2 95% on room air.   Serum sodium is 135, potassium 2.8, chloride 107, bicarb 18, BUN of 17, serum creatinine 1.27, EGFR 43, nonfasting blood glucose 109, WBC 4.5, hemoglobin 11.2, platelets of 127.   High-sensitivity troponin 65.  Lactic acid 1.6.  Magnesium 1.2.  COVID/influenza A/influenza B/RSV PCR were negative.   Blood cultures x 2 are in process.   ED treatment: Doxycycline 100 mg p.o. one-time dose, diltiazem 10 mg x 2 doses, potassium chloride 40 mEq p.o. one-time dose, ceftriaxone 2 g IV one-time dose, magnesium 2 g IV one-time dose, sodium chloride 1 L bolus, diltiazem gtt. initiated.   Assessment and Plan:  # Multilobar pneumonia due to metapneumovirus S/p Azithromycin 500 mg IV daily and Ceftriaxone 2 g IV daily, completed 5-day course.   S/p NS 1 L bolus ordered by EDP and LR 500 mL bolus on admission Incentive spirometry, flutter valve Oxygen supplementation as needed to maintain SpO2 greater than 92% 4/7 had an episode of aspiration and hypoxia, suction was done, hypoxia improved, patient did not require intubation. Added Flagyl to cover anaerobes, patient is  allergic to penicillin RVP positive for metapneumovirus, continue droplet precautions S/p Solu-Medrol 40 mg IV twice daily x 3 doses followed by prednisone 40 mg p.o. daily for 4 days Continue Flagyl 500 twice daily for total 5 days. 4/11 CXR: Shows pulmonary edema and pleural effusion Discussed with the patient's daughter, she does not want to move forward for thoracentesis at this time.  We will continue to monitor. Patient's daughter decided late in the evening for thoracentesis on 4/11 which  could not be done. 4/14 s/p left thoracentesis 400 mL fluid was tapped.  Follow fluid studies Discussed with IR to do thoracentesis on right side tomorrow a.m.  Patient's daughter agreed.   # Multifocal atrial tachycardia's/SVT H/o Atrial fibrillation  Home Eliquis resumed on admission Patient currently not on a rate control medication S/p diltiazem GGT DC'd on 4/8 S/p Lopressor 10 mg IV 1 dose given, s/p Lopressor 50 BID 4/8 BP soft, cardiology recommended NS 500 mL bolus 4/9 changed to Lopressor 50 mg p.o. 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  Moderate MR 4/9 Lasix 20 mg x 1 dose given.  No immediate plan for ischemic workup, this can be done when respiratory status improves. Follow cardiology 4/10 creatinine 1.25 slightly elevated, hold off Lasix for now as per cardio 4/14 Cr 1.36 elevated, hold off Lasix for now   #  Essential hypertension Home lisinopril 10 mg pod, Dc'd 4/7 s/p Lopressor 50 mg p.o. BID due to A-fib with RVR 4/9 started Lopressor 50 mg p.o. twice daily  # Acute gastroenteritis, most likely due to virus infection Diarrhea resolved Monitor electrolytes   # Metabolic acidosis: Repleted bicarb IV.   Resolved Monitor electrolytes and replete as needed  # Stage 3b chronic kidney disease Cr 1.13---1.25--1.36 elevated Continue to monitor  # Mixed hyperlipidemia Lovastatin equivalent, pravastatin 40 mg nightly ordered   # History of normocytic normochromic anemia At baseline   # Hyponatremia, monitor sodium level daily # Hypophosphatemia, Phos repleted. # Hypomagnesemia: Mag repleted. Recheck magnesium level in the morning   # Acute hypokalemia due to diarrhea. Resolved  Status post potassium chloride 40 mEq p.o. one-time dose, potassium chloride 10 mill equivalent, 2 doses ordered by EDP LR 500 mL liter bolus on admission Monitor electrolytes 4/7 potassium 2.9, repleted IV 4/10 K3.3, repleted orally  # Acute delirium,  s/p Seroquel 50 mg x 1 dose given 4/8, patient was sleepy so decreased dose of Seroquel 12.5 mg p.o. twice daily 4/9 decreased Seroquel 12.5 mg p.o. every evening  Body mass index is 20.82 kg/m.  Interventions:  Diet: Regular diet DVT Prophylaxis: Therapeutic Anticoagulation with Eliquis    Advance goals of care discussion: Full code  Family Communication: family was present at bedside, at the time of interview.  The pt provided permission to discuss medical plan with the family. Opportunity was given to ask question and all questions were answered satisfactorily.  Patient has dementia AO x 1 4/14 discussed with patient's daughter at bedside    Disposition:  Pt is from home, admitted with respiratory failure due to pneumonia, A-fib with RVR, diarrhea, hypokalemia, still patient is not stable to discharge,  May DC tomorrow a.m. S/p left thoracentesis Discharge to home, when stable, needs right thoracentesis tomorrow a.m.  Planning to discharge tomorrow a.m. after thoracentesis if remains stable.   Subjective: No significant events overnight, patient was seen after thoracentesis, tolerated procedure well.  Stated that she is feeling all right.  Denied  any complaints.  Physical Exam: General: NAD, laying in the bed with mild SOB Eyes: PERRLA ENT: Oral Mucosa Clear, moist  Neck: no JVD,  Cardiovascular: Irregular rhythm, no Murmur,  Respiratory: Equal air entry bilaterally, bibasilar crackles and decreased breath sounds in the bibasilar area, no significant wheezes Abdomen: Bowel Sound present, Soft and no tenderness,  Skin: no rashes Extremities: no Pedal edema, no calf tenderness Neurologic: without any new focal findings Gait not checked due to patient safety concerns  Vitals:   09/15/23 0813 09/15/23 1050 09/15/23 1118 09/15/23 1523  BP: 126/81 138/74 139/82 129/63  Pulse: (!) 45 96 81 83  Resp: 18   20  Temp:    98.6 F (37 C)  TempSrc:      SpO2: 99% 94% 98% 95%  Weight:      Height:        Intake/Output Summary (Last 24 hours) at 09/15/2023 1647 Last data filed at 09/15/2023 1200 Gross per 24 hour  Intake 290 ml  Output 1850 ml  Net -1560 ml   Filed Weights   09/13/23 0500 09/14/23 0500 09/15/23 0500  Weight: 55 kg 53.4 kg 53.3 kg    Data Reviewed: I have personally reviewed and interpreted daily labs, tele strips, imagings as discussed above. I reviewed all nursing notes, pharmacy notes, vitals, pertinent old records I have discussed plan of care as described above with  RN and patient/family.  CBC: Recent Labs  Lab 09/11/23 0749 09/12/23 0602 09/13/23 0404 09/14/23 0448 09/15/23 0429  WBC 11.7* 11.3* 7.4 8.8 9.7  HGB 9.4* 10.2* 9.3* 10.5* 10.8*  HCT 27.0* 29.2* 27.3* 30.1* 31.9*  MCV 85.7 85.9 87.2 87.5 86.7  PLT 165 197 206 233 269   Basic Metabolic Panel: Recent Labs  Lab 09/10/23 0415 09/11/23 0749 09/12/23 0602 09/13/23 0404 09/14/23 0448 09/15/23 0429  NA 136 138 139 137 136 135  K 3.3* 3.3* 4.4 4.4 4.0 3.6  CL 106 104 106 106 105 102  CO2 23 25 23  21* 24 23  GLUCOSE 164* 136* 137* 179* 122* 92  BUN 21 29* 32* 37* 37* 40*  CREATININE 1.13* 1.25* 1.32* 1.39* 1.35* 1.36*  CALCIUM  7.6* 7.9* 8.3* 8.2* 8.6* 8.3*  MG 2.6* 2.1  --  1.7 1.7 1.7  PHOS 2.8 2.7  --  1.9* 3.9 2.7    Studies: DG Chest Port 1 View Result Date: 09/15/2023 CLINICAL DATA:  Pleural effusion.  Post thoracentesis on the left. EXAM: PORTABLE CHEST 1 VIEW COMPARISON:  Radiographs 09/14/2023 and 09/12/2023. FINDINGS: 1127 hours. No significant change in cardiomegaly, vascular congestion and bilateral pleural effusions. No evidence of pneumothorax. The bones appear unchanged. Telemetry leads overlie the chest. IMPRESSION: No evidence of pneumothorax following thoracentesis. Persistent cardiomegaly, vascular congestion and bilateral pleural effusions. Electronically Signed   By: Carey Bullocks M.D.   On: 09/15/2023 13:22   US THORACENTESIS ASP PLEURAL SPACE W/IMG GUIDE Result Date: 09/15/2023 INDICATION: Patient with congestive heart failure and symptomatic bilateral pleural effusions, request received for diagnostic therapeutic thoracentesis. EXAM: ULTRASOUND GUIDED LEFT THORACENTESIS MEDICATIONS: Local 1% lidocaine only. COMPLICATIONS: None immediate. PROCEDURE: An ultrasound guided thoracentesis was thoroughly discussed with the patient and questions answered. The benefits, risks, alternatives and complications were also discussed. The patient understands and wishes to proceed with the procedure. Written consent was obtained. Ultrasound was performed to localize and mark an adequate pocket of fluid in the left chest. The area was then prepped and draped in the normal sterile fashion. 1% Lidocaine was used for local anesthesia. Under ultrasound guidance a 19 gauge, 7-cm, Yueh catheter was introduced. Thoracentesis was performed. The catheter was removed and a dressing applied. FINDINGS: A total of approximately 400 mL of clear yellow fluid was removed. Samples were sent to the laboratory as requested by the clinical team. IMPRESSION: Successful ultrasound guided left thoracentesis yielding 400 mL of pleural fluid.  This exam was performed by Pattricia Boss PA-C, and was supervised and interpreted by Dr. Archer Asa. Electronically Signed   By: Malachy Moan M.D.   On: 09/15/2023 12:59       Scheduled Meds:  apixaban  2.5 mg Oral BID   arformoterol  15 mcg Nebulization BID   budesonide (PULMICORT) nebulizer solution  0.25 mg Nebulization BID   calcium-vitamin D  2 tablet Oral Daily   Chlorhexidine Gluconate Cloth  6 each Topical Daily   cholecalciferol  1,000 Units Oral Daily   feeding supplement  237 mL Oral TID BM   guaiFENesin  600 mg Oral BID   metoprolol tartrate  50 mg Oral BID   multivitamin with minerals  1 tablet Oral Daily   pantoprazole  40 mg Oral Daily   pravastatin  40 mg Oral q1800   QUEtiapine  12.5 mg Oral QPM   Continuous Infusions:   PRN Meds: guaiFENesin-dextromethorphan, ipratropium-albuterol, melatonin, mouth rinse, senna-docusate  Time spent: 55 minutes  Author: Gillis Santa. MD  [facility] 09/15/2023 4:47 PM  To reach On-call, see care teams to locate the attending and reach out to them via [email or web address]. If 7PM-7AM, please contact night-coverage If you still have difficulty reaching the attending provider, please page the [facility] (Director on Call) for [facility] on amion for assistance.

## 2023-09-15 NOTE — Plan of Care (Signed)

## 2023-09-15 NOTE — Consult Note (Signed)
 PHARMACY CONSULT NOTE - ELECTROLYTES  Pharmacy Consult for Electrolyte Monitoring and Replacement   Recent Labs: Height: 5\' 3"  (160 cm) Weight: 53.3 kg (117 lb 8.1 oz) IBW/kg (Calculated) : 52.4 Estimated Creatinine Clearance: 25 mL/min (A) (by C-G formula based on SCr of 1.36 mg/dL (H)). Potassium (mmol/L)  Date Value  09/15/2023 3.6   Magnesium (mg/dL)  Date Value  32/44/0102 1.7   Calcium (mg/dL)  Date Value  72/53/6644 8.3 (L)   Albumin (g/dL)  Date Value  03/47/4259 3.1 (L)   Phosphorus (mg/dL)  Date Value  56/38/7564 2.7   Sodium (mmol/L)  Date Value  09/15/2023 135    Assessment  Christine Johns is a 83 y.o. female presenting with altered mental status. PMH significant for atrial fibrillation on Eliquis, currently not on rate control, hypertension, dementia, hyperlipidemia. Pharmacy has been consulted to monitor and replace electrolytes. On calcium w/D 2 tabs daily Scr trending up.   Goal of Therapy: Electrolytes WNL (Goal Mag >2.0 and K .4.0  with Afib)  Plan:  Kcl 20 mEq x 1 Mg 2 g IV x 1 F/u with AM labs.   Thank you for allowing pharmacy to be a part of this patient's care.  Trinidad Funk, PharmD Clinical Pharmacist 09/15/2023 8:38 AM

## 2023-09-15 NOTE — Procedures (Signed)
 PROCEDURE SUMMARY:  Successful US  guided left thoracentesis. Yielded 400 mL of clear yellow fluid. Pt tolerated procedure well. No immediate complications.  Specimen was sent for labs. CXR ordered.  EBL < 1 mL  Valdene Garret PA-C 09/15/2023 11:16 AM

## 2023-09-15 NOTE — Progress Notes (Signed)
 Occupational Therapy Treatment Patient Details Name: Christine Johns MRN: [id number] DOB: 05-07-1941 Today's Date: 09/15/2023   History of present illness Pt is an 83 year old female presenting with AMS, work up includes Multilobar pneumonia due to hyponatremia    PMH significant for atrial fibrillation on Eliquis, currently not on rate control, hypertension, dementia, hyperlipidemia   OT comments  Ms Warth was seen for OT treatment on this date. Upon arrival to room pt seated EOB with PT and family in room, agreeable to tx. Pt requires MIN A + RW multiple standing trials at EOB, further activity limited by multiple episodes of loose bowel incontinence. MAX A pericare standing and LBD in sitting. Repeated cues to redirect pt to task. Spo2 88-90% on RA t/o session. Educated family on DME recs. Pt making good progress toward goals, will continue to follow POC. Discharge recommendation remains appropriate.        If plan is discharge home, recommend the following:  A little help with walking and/or transfers;A little help with bathing/dressing/bathroom   Equipment Recommendations  BSC/3in1;Tub/shower seat    Recommendations for Other Services      Precautions / Restrictions Precautions Precautions: Fall Recall of Precautions/Restrictions: Impaired Restrictions Weight Bearing Restrictions Per Provider Order: No       Mobility Bed Mobility Overal bed mobility: Needs Assistance Bed Mobility: Sit to Supine       Sit to supine: Mod assist        Transfers Overall transfer level: Needs assistance Equipment used: Rolling walker (2 wheels), None Transfers: Sit to/from Stand Sit to Stand: Min assist                 Balance Overall balance assessment: Needs assistance Sitting-balance support: Feet supported, No upper extremity supported Sitting balance-Leahy Scale: Fair     Standing balance support: Bilateral upper extremity supported, Reliant on assistive device for  balance Standing balance-Leahy Scale: Fair                             ADL either performed or assessed with clinical judgement   ADL Overall ADL's : Needs assistance/impaired                                       General ADL Comments: MIN A + RW for simulated BSC t/f. MAX A pericare standing and LBD in sitting     Communication Communication Communication: No apparent difficulties Factors Affecting Communication: Reduced clarity of speech   Cognition Arousal: Alert Behavior During Therapy: WFL for tasks assessed/performed Cognition: History of cognitive impairments             OT - Cognition Comments: per daughter cognition near baseline. significant STM deficits                 Following commands: Intact Following commands impaired: Only follows one step commands consistently      Cueing   Cueing Techniques: Verbal cues             Pertinent Vitals/ Pain       Pain Assessment Pain Assessment: PAINAD Breathing: normal Negative Vocalization: none Facial Expression: smiling or inexpressive Body Language: relaxed Consolability: distracted or reassured by voice/touch PAINAD Score: 1   Frequency  Min 2X/week        Progress Toward Goals  OT Goals(current goals can now be  found in the care plan section)  Progress towards OT goals: Progressing toward goals  Acute Rehab OT Goals Patient Stated Goal: go home OT Goal Formulation: With patient/family Time For Goal Achievement: 09/24/23 ADL Goals Pt Will Perform Grooming: with supervision;sitting Pt Will Perform Lower Body Dressing: with supervision;sit to/from stand;sitting/lateral leans Pt Will Transfer to Toilet: with supervision;ambulating Pt Will Perform Toileting - Clothing Manipulation and hygiene: with supervision;sitting/lateral leans;sit to/from stand  Plan      Co-evaluation                 AM-PAC OT "6 Clicks" Daily Activity     Outcome Measure    Help from another person eating meals?: None Help from another person taking care of personal grooming?: A Little Help from another person toileting, which includes using toliet, bedpan, or urinal?: A Lot Help from another person bathing (including washing, rinsing, drying)?: A Lot Help from another person to put on and taking off regular upper body clothing?: A Little Help from another person to put on and taking off regular lower body clothing?: A Lot 6 Click Score: 16    End of Session Equipment Utilized During Treatment: Rolling walker (2 wheels)  OT Visit Diagnosis: Other abnormalities of gait and mobility (R26.89);Muscle weakness (generalized) (M62.81)   Activity Tolerance Patient tolerated treatment well   Patient Left in bed;with call bell/phone within reach;with bed alarm set;with family/visitor present   Nurse Communication Mobility status        Time: 4540-9811 OT Time Calculation (min): 28 min  Charges: OT General Charges $OT Visit: 1 Visit OT Treatments $Self Care/Home Management : 23-37 mins  Gordan Latina, M.S. OTR/L  09/15/23, 3:48 PM  ascom [phone]

## 2023-09-15 NOTE — Progress Notes (Signed)
 Physical Therapy Treatment Patient Details Name: Christine Johns MRN: [id number] DOB: 12/23/1940 Today's Date: 09/15/2023   History of Present Illness Pt is an 83 year old female presenting with AMS, work up includes Multilobar pneumonia due to hyponatremia    PMH significant for atrial fibrillation on Eliquis, currently not on rate control, hypertension, dementia, hyperlipidemia    PT Comments  Pt received upright in bed agreeable to PT. Daughter at bedside. Per NSG requesting O2 sats with walking but unable as pt is still affected by medication from procedure. PRN drowsiness throughout session increasing falls risk. However was placed on RA at rest and during mobility maintaining 90-94% from supine to sit, x2STS efforts at RW, and L side steps to EoB. Pt continues to subjectively report being drowsy but left in care of OT. Updated OT and NSG on O2 sats on RA. D/c recs remain appropriate.     If plan is discharge home, recommend the following: A little help with walking and/or transfers;A little help with bathing/dressing/bathroom;Assistance with cooking/housework;Direct supervision/assist for medications management;Direct supervision/assist for financial management;Assist for transportation;Help with stairs or ramp for entrance;Supervision due to cognitive status   Can travel by private vehicle        Equipment Recommendations  Rolling walker (2 wheels);BSC/3in1    Recommendations for Other Services       Precautions / Restrictions Precautions Precautions: Fall Recall of Precautions/Restrictions: Impaired Restrictions Weight Bearing Restrictions Per Provider Order: No     Mobility  Bed Mobility Overal bed mobility: Needs Assistance Bed Mobility: Supine to Sit     Supine to sit: Min assist, HOB elevated       Patient Response: Cooperative  Transfers Overall transfer level: Needs assistance Equipment used: Rolling walker (2 wheels) Transfers: Sit to/from Stand Sit to Stand:  Contact guard assist, Min assist           General transfer comment: x2 STS efforts. minA for frist attempt and CGA for second attempt.    Ambulation/Gait                   Stairs             Wheelchair Mobility     Tilt Bed Tilt Bed Patient Response: Cooperative  Modified Rankin (Stroke Patients Only)       Balance Overall balance assessment: Needs assistance Sitting-balance support: Bilateral upper extremity supported, Feet supported Sitting balance-Leahy Scale: Fair     Standing balance support: Bilateral upper extremity supported, Reliant on assistive device for balance                                Communication Communication Communication: No apparent difficulties Factors Affecting Communication: Reduced clarity of speech  Cognition Arousal: Lethargic, Suspect due to medications Behavior During Therapy: WFL for tasks assessed/performed   PT - Cognitive impairments: History of cognitive impairments                         Following commands: Intact Following commands impaired: Follows one step commands with increased time    Cueing Cueing Techniques: Verbal cues  Exercises      General Comments General comments (skin integrity, edema, etc.): placed on RA at rest and with mobility. Ranging from 91-94% with bed mobility, STS and side steps at EOB.      Pertinent Vitals/Pain Pain Assessment Pain Assessment: Faces Faces Pain Scale: No hurt Pain  Intervention(s): Monitored during session    Home Living                          Prior Function            PT Goals (current goals can now be found in the care plan section) Acute Rehab PT Goals Patient Stated Goal: did not state PT Goal Formulation: With family Time For Goal Achievement: 09/24/23 Potential to Achieve Goals: Fair Progress towards PT goals: Progressing toward goals    Frequency    Min 2X/week      PT Plan      Co-evaluation               AM-PAC PT "6 Clicks" Mobility   Outcome Measure  Help needed turning from your back to your side while in a flat bed without using bedrails?: A Little Help needed moving from lying on your back to sitting on the side of a flat bed without using bedrails?: A Little Help needed moving to and from a bed to a chair (including a wheelchair)?: A Little Help needed standing up from a chair using your arms (e.g., wheelchair or bedside chair)?: A Little Help needed to walk in hospital room?: A Little Help needed climbing 3-5 steps with a railing? : A Lot 6 Click Score: 17    End of Session Equipment Utilized During Treatment: Gait belt Activity Tolerance: Patient tolerated treatment well;Patient limited by fatigue Patient left: with family/visitor present (seated EOB hand off to OT) Nurse Communication: Mobility status PT Visit Diagnosis: Unsteadiness on feet (R26.81);Muscle weakness (generalized) (M62.81)     Time: 2956-2130 PT Time Calculation (min) (ACUTE ONLY): 14 min  Charges:    $Therapeutic Activity: 8-22 mins PT General Charges $$ ACUTE PT VISIT: 1 Visit                    Marc Senior. Fairly IV, PT, DPT Physical Therapist- [place]  [facility]  09/15/2023, 3:47 PM

## 2023-09-15 NOTE — Progress Notes (Signed)
 SATURATION QUALIFICATIONS: (This note is used to comply with regulatory documentation for home oxygen)  Patient Saturations on Room Air at Rest = 90-94%  Patient Saturations on Room Air while Ambulating = 88%  Patient Saturations on 2 Liters of oxygen while Ambulating = 98%  Please briefly explain why patient needs home oxygen: Patient saturations dropped to 88% with minimal movement to standing position.

## 2023-09-16 ENCOUNTER — Other Ambulatory Visit: Payer: Self-pay

## 2023-09-16 ENCOUNTER — Inpatient Hospital Stay

## 2023-09-16 DIAGNOSIS — R918 Other nonspecific abnormal finding of lung field: Secondary | ICD-10-CM | POA: Diagnosis not present

## 2023-09-16 LAB — BASIC METABOLIC PANEL WITH GFR
Anion gap: 9 (ref 5–15)
BUN: 29 mg/dL — ABNORMAL HIGH (ref 8–23)
CO2: 24 mmol/L (ref 22–32)
Calcium: 8.3 mg/dL — ABNORMAL LOW (ref 8.9–10.3)
Chloride: 103 mmol/L (ref 98–111)
Creatinine, Ser: 1.26 mg/dL — ABNORMAL HIGH (ref 0.44–1.00)
GFR, Estimated: 42 mL/min — ABNORMAL LOW (ref >60)
Glucose, Bld: 100 mg/dL — ABNORMAL HIGH (ref 70–99)
Potassium: 3.4 mmol/L — ABNORMAL LOW (ref 3.5–5.1)
Sodium: 136 mmol/L (ref 135–145)

## 2023-09-16 LAB — BODY FLUID CELL COUNT WITH DIFFERENTIAL
Eos, Fluid: 0 %
Lymphs, Fluid: 18 %
Monocyte-Macrophage-Serous Fluid: 44 %
Neutrophil Count, Fluid: 38 %
Total Nucleated Cell Count, Fluid: 203 uL

## 2023-09-16 LAB — CBC
HCT: 33.2 % — ABNORMAL LOW (ref 36.0–46.0)
Hemoglobin: 11.4 g/dL — ABNORMAL LOW (ref 12.0–15.0)
MCH: 30.1 pg (ref 26.0–34.0)
MCHC: 34.3 g/dL (ref 30.0–36.0)
MCV: 87.6 fL (ref 80.0–100.0)
Platelets: 284 10*3/uL (ref 150–400)
RBC: 3.79 MIL/uL — ABNORMAL LOW (ref 3.87–5.11)
RDW: 13.6 % (ref 11.5–15.5)
WBC: 9.5 10*3/uL (ref 4.0–10.5)
nRBC: 0.3 % — ABNORMAL HIGH (ref 0.0–0.2)

## 2023-09-16 LAB — PROTEIN, PLEURAL OR PERITONEAL FLUID: Total protein, fluid: <3 g/dL

## 2023-09-16 LAB — PHOSPHORUS: Phosphorus: 3.1 mg/dL (ref 2.5–4.6)

## 2023-09-16 LAB — MAGNESIUM: Magnesium: 1.7 mg/dL (ref 1.7–2.4)

## 2023-09-16 LAB — LACTATE DEHYDROGENASE, PLEURAL OR PERITONEAL FLUID: LD, Fluid: 55 U/L — ABNORMAL HIGH (ref 3–23)

## 2023-09-16 MED ORDER — DEXTROMETHORPHAN-GUAIFENESIN 20-200 MG/20ML PO LIQD
20.0000 mL | ORAL | 0 refills | Status: AC | PRN
  Filled 2023-09-16: qty 236, 2d supply, fill #0

## 2023-09-16 MED ORDER — POTASSIUM CHLORIDE CRYS ER 20 MEQ PO TBCR
40.0000 meq | EXTENDED_RELEASE_TABLET | Freq: Once | ORAL | Status: AC
  Administered 2023-09-16: 40 meq via ORAL
  Filled 2023-09-16: qty 2

## 2023-09-16 MED ORDER — LIDOCAINE HCL (PF) 1 % IJ SOLN
5.0000 mL | Freq: Once | INTRAMUSCULAR | Status: AC
  Administered 2023-09-16: 5 mL via INTRADERMAL

## 2023-09-16 MED ORDER — PANTOPRAZOLE SODIUM 40 MG PO TBEC
40.0000 mg | DELAYED_RELEASE_TABLET | Freq: Every day | ORAL | 0 refills | Status: AC
  Filled 2023-09-16: qty 7, 7d supply, fill #0

## 2023-09-16 MED ORDER — POLYETHYLENE GLYCOL 3350 17 GM/SCOOP PO POWD
17.0000 g | Freq: Every day | ORAL | 0 refills | Status: DC
  Filled 2023-09-16: qty 238, 14d supply, fill #0

## 2023-09-16 MED ORDER — METOPROLOL TARTRATE 25 MG PO TABS
25.0000 mg | ORAL_TABLET | Freq: Two times a day (BID) | ORAL | 5 refills | Status: AC
  Filled 2023-09-16: qty 60, 30d supply, fill #0

## 2023-09-16 MED ORDER — MAGNESIUM SULFATE 4 GM/100ML IV SOLN
4.0000 g | Freq: Once | INTRAVENOUS | Status: AC
  Administered 2023-09-16: 4 g via INTRAVENOUS
  Filled 2023-09-16: qty 100

## 2023-09-16 MED ORDER — QUETIAPINE FUMARATE 25 MG PO TABS
12.5000 mg | ORAL_TABLET | Freq: Every evening | ORAL | 0 refills | Status: DC
  Filled 2023-09-16: qty 15, 30d supply, fill #0

## 2023-09-16 NOTE — Procedures (Signed)
 PROCEDURE SUMMARY:  Successful US  guided right thoracentesis. Yielded 400 mL of clear yellow fluid. Pt tolerated procedure well. No immediate complications.  Specimen was sent for labs. CXR ordered.  EBL < 1 mL  Horatio Lynch 09/16/2023 10:39 AM

## 2023-09-16 NOTE — Progress Notes (Addendum)
 Nutrition Follow-up  DOCUMENTATION CODES:   Non-severe (moderate) malnutrition in context of social or environmental circumstances  INTERVENTION:   -Continue Ensure Enlive po TID, each supplement provides 350 kcal and 20 grams of protein -Continue Magic cup TID with meals, each supplement provides 290 kcal and 9 grams of protein  -Continue MVI with minerals daily  NUTRITION DIAGNOSIS:   Moderate Malnutrition related to social / environmental circumstances as evidenced by moderate fat depletion, moderate muscle depletion.  Ongoing  GOAL:   Patient will meet greater than or equal to 90% of their needs  Progressing   MONITOR:   PO intake, Supplement acceptance, Labs, Weight trends, I & O's, Skin  REASON FOR ASSESSMENT:   Malnutrition Screening Tool    ASSESSMENT:   83 y/o female with history of atrial fibrillation on Eliquis, currently not on rate control, hypertension, dementia, hyperlipidemia and CKD III who is admitted with metapneumovirus and PNA.  4/14- s/p lt thoracentesis (400 ml removed)  Reviewed I/O's: -1.3 L x 24 hours and -2.7 L since admission  UOP: 1.9 L x 24 hours  Spoke with pt and daughter at bedside. Pt pleasant and in good spirits, smiling at this RD throughout visit. Noted breakfast tray at bedside. Per daughter, appetite is not great, but is has been encouraging pt to eat her meals. Pt likes Ensure supplements and has been drinking them. Discussed importance of good meal and supplement intake to promote healing.   Noted 2.2% wt loss since admission. Suspect this may be related to losses from thoracentesis and fluid losses. RD will continue to monitor wt trends.   Palliative care consult pending for goals of care discussions; pt daughter hopeful for improvement.   Per TOC notes, plan to discharge home with home health services. Pt eager to discharge home soon.   Medications reviewed and include calcium with vitamin D, vitamin D3, and magnesium  sulfate.   Labs reviewed: K: 3.4, Mg and Phos WDL. CBGS: 181. Pharmacy following for electrolyte management.   Diet Order:   Diet Order             Diet regular Room service appropriate? Yes with Assist; Fluid consistency: Thin  Diet effective now                   EDUCATION NEEDS:   Education needs have been addressed  Skin:  Skin Assessment: Reviewed RN Assessment  Last BM:  09/16/23 (type 6)  Height:   Ht Readings from Last 1 Encounters:  09/08/23 5\' 3"  (1.6 m)    Weight:   Wt Readings from Last 1 Encounters:  09/16/23 52.4 kg    Ideal Body Weight:  52.2 kg  BMI:  Body mass index is 20.46 kg/m.  Estimated Nutritional Needs:   Kcal:  1400-1600kcal/day  Protein:  70-80g/day  Fluid:  1.4-1.6L/day    Herschel Lords, RD, LDN, CDCES Registered Dietitian III Certified Diabetes Care and Education Specialist If unable to reach this RD, please use "RD Inpatient" group chat on secure chat between hours of 8am-4 pm daily

## 2023-09-16 NOTE — Discharge Summary (Signed)
 [facility] Discharge Summary   Patient: Christine Johns ZOX:[id number]  PCP: Marisue Ivan, MD  Date of admission: 09/07/2023   Date of discharge:  09/16/2023     Discharge Diagnoses:  Principal Problem:   Multilobar lung infiltrate Active Problems:   Atrial fibrillation with rapid ventricular response (HCC)   Acute hypokalemia   Hypomagnesemia   Cognitive impairment   Essential hypertension   History of normocytic normochromic anemia   Mixed hyperlipidemia   Stage 3b chronic kidney disease (HCC)   Elevated troponin   Malnutrition of moderate degree   Pneumonia due to infectious organism   Multifocal atrial tachycardia (HCC)   Admitted From: Home Disposition:  Home with [facility]  Recommendations for Outpatient Follow-up:  F/u with PCP in 1 wk,  F/u with Cardio in 1-2 weeks Follow up LABS/TEST:  CXR in 4 wks, CBC and BMP in 1-2 weeks   Follow-up Information     [facility] Follow up.   Why: They will follow up with you for your home health needs.        Marisue Ivan, MD Follow up in 1 week(s).   Specialty: Family Medicine Contact information: [address] [facility] [place] [address] [phone]         Yvonne Kendall, MD Follow up in 1 week(s).   Specialty: Cardiology Contact information: [address] [place] [address] [phone]                Diet recommendation: Cardiac diet  Activity: The patient is advised to gradually reintroduce usual activities, as tolerated  Discharge Condition: stable  Code Status: Full code   History of present illness: As per the H and P dictated on admission Hospital Course:  Ms. Christine Johns is an 83 year old female with history of atrial fibrillation on Eliquis, currently not on rate control, hypertension, dementia, hyperlipidemia, who presents emergency department for chief concerns of altered mental status.   Patient presented via EMS and per  nursing documentation, patient received sodium chloride 500 mL liter bolus per EMS.   Vitals in the ED showed temperature 99, respiration rate 29, improved to 22, heart rate initially 50, currently 80, and on EKG showed atrial fibrillation with rate of 129, QTc 552.  Blood pressure is 159/98, SpO2 95% on room air.   Serum sodium is 135, potassium 2.8, chloride 107, bicarb 18, BUN of 17, serum creatinine 1.27, EGFR 43, nonfasting blood glucose 109, WBC 4.5, hemoglobin 11.2, platelets of 127.   High-sensitivity troponin 65.  Lactic acid 1.6.  Magnesium 1.2.  COVID/influenza A/influenza B/RSV PCR were negative.   Blood cultures x 2 are in process.   ED treatment: Doxycycline 100 mg p.o. one-time dose, diltiazem 10 mg x 2 doses, potassium chloride 40 mEq p.o. one-time dose, ceftriaxone 2 g IV one-time dose, magnesium 2 g IV one-time dose, sodium chloride 1 L bolus, diltiazem gtt. initiated.     Assessment and Plan:   # Multilobar pneumonia due to metapneumovirus S/p Azithromycin 500 mg IV daily and Ceftriaxone 2 g IV daily, completed 5-day course.  S/p NS 1 L bolus ordered by EDP and LR 500 mL bolus on admission s/p Incentive spirometry, flutter valve. S/p supplemental O2 admission and gradually weaned off. On 4/7 she had an episode of aspiration and hypoxia, suction was done, hypoxia improved, patient did not require intubation. Added Flagyl to cover anaerobes, patient is allergic to penicillin.  RVP positive for metapneumovirus, s/p droplet precautions  S/p Solu-Medrol 40 mg IV twice daily x 3 doses followed by prednisone 40 mg p.o. daily for 4 days. S/p Flagyl 500 twice daily for total 5 days. 4/11 CXR: Shows pulmonary edema and pleural effusion Discussed with the patient's daughter, she does not want to move forward for thoracentesis at this time.  We will continue to monitor. Patient's daughter decided late in the evening for thoracentesis on 4/11 which  could not be done. 4/14 s/p left  thoracentesis 400 mL fluid was tapped.   4/15 s/p right thoracentesis 400 mL fluid was tapped. Fluid culture negative.  Patient completed above antibiotics, no more need of oral antibiotics on discharge.  Clinically improved, advised to repeat chest x-ray after 4 weeks and follow with PCP.   # Multifocal atrial tachycardia's/SVT H/o Atrial fibrillation: Home Eliquis resumed on admission Patient currently not on a rate control medication S/p diltiazem IV infusion DC'd on 4/8, S/p Lopressor 10 mg IV 1 dose given, s/p Lopressor 50 BID. On 4/8 BP soft, cardiology recommended NS 500 mL bolus 4/9 changed to Lopressor 50 mg p.o. twice daily.  Cardiology signed off, recommended to follow as an outpatient.   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 S/p kidney monitor. TTE LVEF 45 to 50%, LV global hypokinesis, grade 2 diastolic dysfunction.  Moderate MR 4/9 Lasix 20 mg x 1 dose given.  No immediate plan for ischemic workup, this can be done when respiratory status improves. Follow cardiology 4/10 creatinine 1.25 slightly elevated, hold off Lasix for now as per cardio 4/14 Cr 1.36 elevated, hold off Lasix for now. On 4/15 Cr 1.26 improved    # Essential hypertension: Home lisinopril 10 mg pod, Dc'd during hospital stay but resumed on discharge. S/p Lopressor 50 mg p.o. twice daily increased due to MAT/SVTs which resolved.  BP soft and heart rate variable, HR 65 on discharge so patient was discharged on Lopressor 25 mg p.o. twice daily and continued lisinopril 10 mg p.o. daily home dose. Recommended to monitor BP and heart rate at home and follow with PCP and cardiology as an outpatient.   # Acute gastroenteritis, most likely due to virus infection Diarrhea resolved.  Electrolytes monitored and repleted.      # Metabolic acidosis: Repleted bicarb IV.  Resolved # Stage 3b  chronic kidney disease: Creatinine elevated due to dehydration secondary to acute diarrhea.  Creatinine gradually improved.  Avoided Lasix for few days.  Catheter was discontinued before discharge and patient voided well.  Recommend to repeat BMP after 1 week and follow with PCP.  # Mixed hyperlipidemia: Lovastatin equivalent, pravastatin 40 mg nightly given during hospital stay.  Resumed lovastatin on discharge.   # History of normocytic normochromic anemia: At baseline   # Hyponatremia: Na level improved and hyponatremia resolved.  NA 136 today # Hypophosphatemia, Phos repleted.  Resolved # Hypomagnesemia: Mag repleted.  Resolved # Acute hypokalemia due to diarrhea.  Potassium repleted.  # Acute delirium,  s/p Seroquel 50 mg x 1 dose given 4/8, patient was sleepy so decreased dose of Seroquel 12.5 mg p.o. twice daily 4/9 decreased Seroquel 12.5 mg p.o. every evening    Body mass index is 20.46 kg/m.  Nutrition Problem: Moderate Malnutrition Etiology: social / environmental circumstances Nutrition Interventions:  Patient was seen by physical therapy, who recommended Home health, which was arranged. On the day of the discharge the patient's vitals were stable,  and no other acute medical condition were reported by patient. the patient was felt safe to be discharge at Home with Home health.  Consultants: Cardiology and IR Procedures: Bilateral thoracentesis s/p 400 mL fluid was tapped from both sides  Discharge Exam: General: Appear in no distress, no Rash; Oral Mucosa Clear, moist. Cardiovascular: S1 and S2 Present, no Murmur, Respiratory: normal respiratory effort, Bilateral Air entry present and mild basilar crackles, but no wheezes Abdomen: Bowel Sound present, Soft and no tenderness, no hernia Extremities: no Pedal edema, no calf tenderness Neurology: AAO x 1 due to dementia.  No focal deficits.   affect appropriate.  Filed Weights   09/15/23 0500 09/16/23 0423 09/16/23 0514   Weight: 53.3 kg 52 kg 52.4 kg   Vitals:   09/16/23 1034 09/16/23 1108  BP: 118/65 (!) 105/58  Pulse: 74 65  Resp:    Temp:  98.3 F (36.8 C)  SpO2: 95% 94%    DISCHARGE MEDICATION: Allergies as of 09/16/2023       Reactions   Atorvastatin Other (See Comments)   Penicillins Itching, Swelling   Sulfa Antibiotics Itching, Swelling        Medication List     TAKE these medications    apixaban 2.5 MG Tabs tablet Commonly known as: ELIQUIS Take 1 tablet (2.5 mg total) by mouth 2 (two) times daily.   Calcium Carb-Cholecalciferol 600-10 MG-MCG Tabs Take 2 tablets by mouth daily.   guaiFENesin-dextromethorphan 100-10 MG/5ML syrup Commonly known as: ROBITUSSIN DM Take 10 mLs by mouth every 4 (four) hours as needed for cough.   lisinopril 10 MG tablet Commonly known as: ZESTRIL Take 10 mg by mouth daily.   lovastatin 40 MG tablet Commonly known as: MEVACOR Take 40 mg by mouth daily.   metoprolol tartrate 25 MG tablet Commonly known as: LOPRESSOR Take 1 tablet (25 mg total) by mouth 2 (two) times daily.   Multi-Vitamin tablet Take 1 tablet by mouth daily.   pantoprazole 40 MG tablet Commonly known as: PROTONIX Take 1 tablet (40 mg total) by mouth daily for 7 days. Start taking on: September 17, 2023   polyethylene glycol 17 g packet Commonly known as: MiraLax Take 17 g by mouth daily.   QUEtiapine 25 MG tablet Commonly known as: SEROQUEL Take 0.5 tablets (12.5 mg total) by mouth every evening.   Vitamin D 125 MCG (5000 UT) Caps Take 1 capsule by mouth daily.               Durable Medical Equipment  (From admission, onward)           Start     Ordered   09/16/23 1302  For home use only DME 3 n 1  Once        09/16/23 1301   09/15/23 1355  For home use only DME oxygen  Once       Question Answer Comment  Length of Need 6 Months   Mode or (Route) Nasal cannula   Liters per Minute 2   Frequency Continuous (stationary and portable oxygen unit  needed)   Oxygen conserving device Yes   Oxygen delivery system Gas      09/15/23 1355   09/11/23 1108  For home use only DME 4 wheeled rolling walker with seat  Once       Question:  Patient needs a walker to treat with the following condition  Answer:  Weakness generalized   09/11/23 1108  Allergies  Allergen Reactions   Atorvastatin Other (See Comments)   Penicillins Itching and Swelling   Sulfa Antibiotics Itching and Swelling   Discharge Instructions     Call MD for:  difficulty breathing, headache or visual disturbances   Complete by: As directed    Call MD for:  extreme fatigue   Complete by: As directed    Call MD for:  persistant dizziness or light-headedness   Complete by: As directed    Call MD for:  severe uncontrolled pain   Complete by: As directed    Call MD for:  temperature >100.4   Complete by: As directed    Diet - low sodium heart healthy   Complete by: As directed    Discharge instructions   Complete by: As directed    F/u with PCP in 1 wk, repeat CXR in 4 wks, CBC and BMP in 1-2 weeks F/u with Cardio in 1-2 weeks   Increase activity slowly   Complete by: As directed        The results of significant diagnostics from this hospitalization (including imaging, microbiology, ancillary and laboratory) are listed below for reference.    Significant Diagnostic Studies: DG Chest Port 1 View Result Date: 09/15/2023 CLINICAL DATA:  Pleural effusion.  Post thoracentesis on the left. EXAM: PORTABLE CHEST 1 VIEW COMPARISON:  Radiographs 09/14/2023 and 09/12/2023. FINDINGS: 1127 hours. No significant change in cardiomegaly, vascular congestion and bilateral pleural effusions. No evidence of pneumothorax. The bones appear unchanged. Telemetry leads overlie the chest. IMPRESSION: No evidence of pneumothorax following thoracentesis. Persistent cardiomegaly, vascular congestion and bilateral pleural effusions. Electronically Signed   By: Carey Bullocks  M.D.   On: 09/15/2023 13:22   US THORACENTESIS ASP PLEURAL SPACE W/IMG GUIDE Result Date: 09/15/2023 INDICATION: Patient with congestive heart failure and symptomatic bilateral pleural effusions, request received for diagnostic therapeutic thoracentesis. EXAM: ULTRASOUND GUIDED LEFT THORACENTESIS MEDICATIONS: Local 1% lidocaine only. COMPLICATIONS: None immediate. PROCEDURE: An ultrasound guided thoracentesis was thoroughly discussed with the patient and questions answered. The benefits, risks, alternatives and complications were also discussed. The patient understands and wishes to proceed with the procedure. Written consent was obtained. Ultrasound was performed to localize and mark an adequate pocket of fluid in the left chest. The area was then prepped and draped in the normal sterile fashion. 1% Lidocaine was used for local anesthesia. Under ultrasound guidance a 19 gauge, 7-cm, Yueh catheter was introduced. Thoracentesis was performed. The catheter was removed and a dressing applied. FINDINGS: A total of approximately 400 mL of clear yellow fluid was removed. Samples were sent to the laboratory as requested by the clinical team. IMPRESSION: Successful ultrasound guided left thoracentesis yielding 400 mL of pleural fluid. This exam was performed by Pattricia Boss PA-C, and was supervised and interpreted by Dr. Archer Asa. Electronically Signed   By: Malachy Moan M.D.   On: 09/15/2023 12:59   DG Chest Port 1 View Result Date: 09/14/2023 CLINICAL DATA:  Follow-up pleural effusion EXAM: PORTABLE CHEST 1 VIEW COMPARISON:  09/12/2023 FINDINGS: Cardiac shadow is mildly prominent but accentuated by the portable technique. Aortic calcifications are seen. Vascular congestion is again identified with small effusions bilaterally consistent with CHF. No new focal abnormality is noted. IMPRESSION: Changes of CHF stable from the prior study. Electronically Signed   By: Alcide Clever M.D.   On: 09/14/2023 19:05    DG Chest Port 1 View Result Date: 09/12/2023 CLINICAL DATA:  [id number] Pulmonary edema [id number]. EXAM: PORTABLE CHEST 1 VIEW COMPARISON:  09/08/2023. FINDINGS: There is mild-to-moderate diffuse pulmonary vascular congestion with bilateral hilar and bibasilar predominance. There are bilateral small-to-moderate layering pleural effusions. No pneumothorax. Mildly enlarged cardio-mediastinal silhouette, which is likely accentuated by low lung volume and AP technique. No acute osseous abnormalities. The soft tissues are within normal limits. IMPRESSION: *Findings favor congestive heart failure/pulmonary edema. Electronically Signed   By: Beula Brunswick M.D.   On: 09/12/2023 14:35   ECHOCARDIOGRAM COMPLETE Result Date: 09/10/2023    ECHOCARDIOGRAM REPORT   Patient Name:   GABRIELLA WOODHEAD Date of Exam: 09/09/2023 Medical Rec #:  [id number]     Height:       63.0 in Accession #:    [id number]    Weight:       118.2 lb Date of Birth:  [birth year]     BSA:          1.546 m Patient Age:    83 years      BP:           91/44 mmHg Patient Gender: F             HR:           78 bpm. Exam Location:  [facility] Procedure: 2D Echo, Cardiac Doppler and Color Doppler (Both Spectral and Color            Flow Doppler were utilized during procedure). Indications:     I48.91 Atrial Fibrillation  History:         Patient has prior history of Echocardiogram examinations, most                  recent 05/02/2023. Arrythmias:Paroxysmal Atrial Fibrillation;                  Risk Factors:Hypertension and Dyslipidemia.  Sonographer:     Brigid Canada RDCS Referring Phys:  [id number] Althia Atlas Diagnosing Phys: Sammy Crisp MD IMPRESSIONS  1. Left ventricular ejection fraction, by estimation, is 45 to 50%. The left ventricle has mildly decreased function. The left ventricle demonstrates global hypokinesis. There is mild asymmetric left ventricular hypertrophy of the basal-septal segment. Left ventricular diastolic parameters are consistent with  Grade II diastolic dysfunction (pseudonormalization). Elevated left atrial pressure.  2. Right ventricular systolic function is normal. The right ventricular size is normal.  3. Left atrial size was mildly dilated.  4. The mitral valve is degenerative. Moderate mitral valve regurgitation. No evidence of mitral stenosis.  5. The aortic valve is tricuspid. There is mild thickening of the aortic valve. Aortic valve regurgitation is not visualized. Aortic valve sclerosis is present, with no evidence of aortic valve stenosis.  6. The inferior vena cava is dilated in size with <50% respiratory variability, suggesting right atrial pressure of 15 mmHg. FINDINGS  Left Ventricle: Left ventricular ejection fraction, by estimation, is 45 to 50%. The left ventricle has mildly decreased function. The left ventricle demonstrates global hypokinesis. The left ventricular internal cavity size was normal in size. There is  mild asymmetric left ventricular hypertrophy of the basal-septal segment. Left ventricular diastolic parameters are consistent with Grade II diastolic dysfunction (pseudonormalization). Elevated left atrial pressure. Right Ventricle: The right ventricular size is normal. No increase in right ventricular wall thickness. Right ventricular systolic function is normal. Left Atrium: Left atrial size was mildly dilated. Right Atrium: Right atrial size was normal in size. Pericardium: Trivial pericardial effusion is present. Mitral Valve: The mitral valve is degenerative in appearance. There is mild thickening of the mitral valve  leaflet(s). Moderate mitral valve regurgitation. No evidence of mitral valve stenosis. Tricuspid Valve: The tricuspid valve is normal in structure. Tricuspid valve regurgitation is trivial. Aortic Valve: The aortic valve is tricuspid. There is mild thickening of the aortic valve. Aortic valve regurgitation is not visualized. Aortic valve sclerosis is present, with no evidence of aortic valve  stenosis. Aortic valve mean gradient measures 5.5  mmHg. Aortic valve peak gradient measures 8.6 mmHg. Aortic valve area, by VTI measures 1.02 cm. Pulmonic Valve: The pulmonic valve was normal in structure. Pulmonic valve regurgitation is mild. No evidence of pulmonic stenosis. Aorta: The aortic root and ascending aorta are structurally normal, with no evidence of dilitation. Pulmonary Artery: The pulmonary artery is of normal size. Venous: The inferior vena cava is dilated in size with less than 50% respiratory variability, suggesting right atrial pressure of 15 mmHg. IAS/Shunts: The interatrial septum was not well visualized.  LEFT VENTRICLE PLAX 2D LVIDd:         5.00 cm     Diastology LVIDs:         3.20 cm     LV e' medial:    5.66 cm/s LV PW:         0.80 cm     LV E/e' medial:  18.6 LV IVS:        1.12 cm     LV e' lateral:   6.74 cm/s LVOT diam:     1.80 cm     LV E/e' lateral: 15.6 LV SV:         36 LV SV Index:   23 LVOT Area:     2.54 cm  LV Volumes (MOD) LV vol d, MOD A2C: 61.2 ml LV vol d, MOD A4C: 66.2 ml LV vol s, MOD A2C: 37.9 ml LV vol s, MOD A4C: 35.9 ml LV SV MOD A2C:     23.3 ml LV SV MOD A4C:     66.2 ml LV SV MOD BP:      27.5 ml RIGHT VENTRICLE             IVC RV Basal diam:  3.70 cm     IVC diam: 2.10 cm RV S prime:     11.60 cm/s TAPSE (M-mode): 2.4 cm LEFT ATRIUM             Index        RIGHT ATRIUM          Index LA diam:        3.40 cm 2.20 cm/m   RA Area:     9.82 cm LA Vol (A2C):   63.4 ml 41.01 ml/m  RA Volume:   22.30 ml 14.42 ml/m LA Vol (A4C):   46.1 ml 29.82 ml/m LA Biplane Vol: 54.1 ml 34.99 ml/m  AORTIC VALVE AV Area (Vmax):    1.22 cm AV Area (Vmean):   1.11 cm AV Area (VTI):     1.02 cm AV Vmax:           146.30 cm/s AV Vmean:          113.896 cm/s AV VTI:            0.353 m AV Peak Grad:      8.6 mmHg AV Mean Grad:      5.5 mmHg LVOT Vmax:         70.20 cm/s LVOT Vmean:        49.533 cm/s LVOT VTI:  0.141 m LVOT/AV VTI ratio: 0.40  AORTA Ao Root diam: 3.20  cm Ao Asc diam:  3.40 cm MITRAL VALVE MV Area (PHT): 3.32 cm     SHUNTS MV Decel Time: 229 msec     Systemic VTI:  0.14 m MV E velocity: 105.00 cm/s  Systemic Diam: 1.80 cm MV A velocity: 95.90 cm/s MV E/A ratio:  1.09 Yvonne Kendall MD Electronically signed by Yvonne Kendall MD Signature Date/Time: 09/10/2023/8:55:54 AM    Final    DG Chest Port 1 View Result Date: 09/08/2023 CLINICAL DATA:  Aspiration into airway. EXAM: PORTABLE CHEST 1 VIEW COMPARISON:  Chest radiograph dated 09/07/2023. FINDINGS: Patient is rotated to the right. The heart size and mediastinal contours are unchanged. Increasing left basilar opacity. Right lung is clear. No sizable pleural effusion or pneumothorax. No acute osseous abnormality. IMPRESSION: Increasing left basilar opacity, which could reflect aspiration or worsening pneumonia. Electronically Signed   By: Hart Robinsons M.D.   On: 09/08/2023 15:28   CT Cervical Spine Wo Contrast Result Date: 09/07/2023 CLINICAL DATA:  Neck trauma.  Altered mental status.  Abnormal gait. EXAM: CT CERVICAL SPINE WITHOUT CONTRAST TECHNIQUE: Multidetector CT imaging of the cervical spine was performed without intravenous contrast. Multiplanar CT image reconstructions were also generated. RADIATION DOSE REDUCTION: This exam was performed according to the departmental dose-optimization program which includes automated exposure control, adjustment of the mA and/or kV according to patient size and/or use of iterative reconstruction technique. COMPARISON:  None Available. FINDINGS: Alignment: Slight degenerative anterolisthesis is present at C3-4 and C4-5. Reversal of the normal cervical lordosis is present. Skull base and vertebrae: The craniocervical junction is normal. The vertebral body heights are normal. No acute fractures are present. Prominent pannus formation is present at C1-2. Soft tissues and spinal canal: No prevertebral fluid or swelling. No visible canal hematoma. Disc levels:  Uncovertebral spurring is worse left than right at C5-6 and C6-7 with left foraminal stenosis at both of these levels Upper chest: A left pleural effusion is present. Mild dependent atelectasis is present bilaterally. IMPRESSION: 1. No acute fracture or traumatic subluxation. 2. Degenerative changes of the cervical spine as described. 3. Left pleural effusion. Electronically Signed   By: Marin Roberts M.D.   On: 09/07/2023 13:18   CT HEAD WO CONTRAST ( ) Result Date: 09/07/2023 CLINICAL DATA:  Head trauma, minor. Altered mental status. Weakness. Possible urinary tract infection. EXAM: CT HEAD WITHOUT CONTRAST TECHNIQUE: Contiguous axial images were obtained from the base of the skull through the vertex without intravenous contrast. RADIATION DOSE REDUCTION: This exam was performed according to the departmental dose-optimization program which includes automated exposure control, adjustment of the mA and/or kV according to patient size and/or use of iterative reconstruction technique. COMPARISON:  CT head without contrast 05/02/2023. FINDINGS: Brain: Advanced atrophy and white matter disease is similar the prior study. No acute or focal cortical abnormality is present. No acute hemorrhage or mass lesion is present. A remote lacunar infarct of the left basal ganglia is stable. Deep gray nuclei are otherwise within normal limits. The brainstem and cerebellum are within normal limits. Midline structures are within normal limits. Vascular: Atherosclerotic calcifications are present within the cavernous internal carotid arteries bilaterally. No hyperdense vessel is present. Skull: Calvarium is intact. No focal lytic or blastic lesions are present. No significant extracranial soft tissue lesion is present. Sinuses/Orbits: The paranasal sinuses and mastoid air cells are clear. Bilateral lens replacements are noted. Globes and orbits are otherwise unremarkable. IMPRESSION: 1. No acute  intracranial abnormality or  significant interval change. 2. Advanced atrophy and white matter disease is similar the prior study and likely reflects the sequela of chronic microvascular ischemia. 3. 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 Electronically Signed   By: Audree Leas M.D.   On: 09/07/2023 13:15   DG Chest Port 1 View Result Date: 09/07/2023 CLINICAL DATA:  Altered mental status, weakness.  Suspected sepsis. EXAM: PORTABLE CHEST 1 VIEW COMPARISON:  03/27/2020 FINDINGS: Heart mediastinal contours within normal limits. Patchy left lower lobe airspace opacity could reflect early pneumonia. No confluent opacity on the right. No effusions or acute bony abnormality. IMPRESSION: Patchy left lower lobe opacity, question early pneumonia. Electronically Signed   By: Janeece Mechanic M.D.   On: 09/07/2023 12:12    Microbiology: Recent Results (from the past 240 hours)  Culture, blood (Routine x 2)     Status: None   Collection Time: 09/07/23 10:53 AM   Specimen: BLOOD  Result Value Ref Range Status   Specimen Description BLOOD LFA  Final   Special Requests   Final    BOTTLES DRAWN AEROBIC AND ANAEROBIC Blood Culture results may not be optimal due to an inadequate volume of blood received in culture bottles   Culture   Final    NO GROWTH 5 DAYS Performed at [facility], [address] [place], [place] [address]    Report Status 09/12/2023 FINAL  Final  Culture, blood (Routine x 2)     Status: None   Collection Time: 09/07/23 10:53 AM   Specimen: BLOOD  Result Value Ref Range Status   Specimen Description BLOOD RPF  Final   Special Requests   Final    BOTTLES DRAWN AEROBIC AND ANAEROBIC Blood Culture results may not be optimal due to an inadequate volume of blood received in culture bottles   Culture   Final    NO GROWTH 5 DAYS Performed at [facility], [address] [place], [place] [address]    Report Status 09/12/2023 FINAL  Final  Resp panel by RT-PCR (RSV, Flu A&B, Covid)  Anterior Nasal Swab     Status: None   Collection Time: 09/07/23 11:02 AM   Specimen: Anterior Nasal Swab  Result Value Ref Range Status   SARS Coronavirus 2 by RT PCR NEGATIVE NEGATIVE Final    Comment: (NOTE) SARS-CoV-2 target nucleic acids are NOT DETECTED.  The SARS-CoV-2 RNA is generally detectable in upper respiratory specimens during the acute phase of infection. The lowest concentration of SARS-CoV-2 viral copies this assay can detect is 138 copies/mL. A negative result does not preclude SARS-Cov-2 infection and should not be used as the sole basis for treatment or other patient management decisions. A negative result may occur with  improper specimen collection/handling, submission of specimen other than nasopharyngeal swab, presence of viral mutation(s) within the areas targeted by this assay, and inadequate number of viral copies(<138 copies/mL). A negative result must be combined with clinical observations, patient history, and epidemiological information. The expected result is Negative.  Fact Sheet for Patients:  BloggerCourse.com  Fact Sheet for Healthcare Providers:  SeriousBroker.it  This test is no t yet approved or cleared by the [place]  FDA and  has been authorized for detection and/or diagnosis of SARS-CoV-2 by FDA under an Emergency Use Authorization (EUA). This EUA will remain  in effect (meaning this test can be used) for the duration of the COVID-19 declaration under Section 564(b)(1) of the Act, 21 U.S.C.section 360bbb-3(b)(1), unless the  authorization is terminated  or revoked sooner.       Influenza A by PCR NEGATIVE NEGATIVE Final   Influenza B by PCR NEGATIVE NEGATIVE Final    Comment: (NOTE) The Xpert Xpress SARS-CoV-2/FLU/RSV plus assay is intended as an aid in the diagnosis of influenza from Nasopharyngeal swab specimens and should not be used as a sole basis for treatment. Nasal washings  and aspirates are unacceptable for Xpert Xpress SARS-CoV-2/FLU/RSV testing.  Fact Sheet for Patients: BloggerCourse.com  Fact Sheet for Healthcare Providers: SeriousBroker.it  This test is not yet approved or cleared by the [place]  FDA and has been authorized for detection and/or diagnosis of SARS-CoV-2 by FDA under an Emergency Use Authorization (EUA). This EUA will remain in effect (meaning this test can be used) for the duration of the COVID-19 declaration under Section 564(b)(1) of the Act, 21 U.S.C. section 360bbb-3(b)(1), unless the authorization is terminated or revoked.     Resp Syncytial Virus by PCR NEGATIVE NEGATIVE Final    Comment: (NOTE) Fact Sheet for Patients: BloggerCourse.com  Fact Sheet for Healthcare Providers: SeriousBroker.it  This test is not yet approved or cleared by the [place]  FDA and has been authorized for detection and/or diagnosis of SARS-CoV-2 by FDA under an Emergency Use Authorization (EUA). This EUA will remain in effect (meaning this test can be used) for the duration of the COVID-19 declaration under Section 564(b)(1) of the Act, 21 U.S.C. section 360bbb-3(b)(1), unless the authorization is terminated or revoked.  Performed at [facility], [address] [place], [place] [address]   Urine Culture     Status: None   Collection Time: 09/07/23  1:41 PM   Specimen: Urine, Clean Catch  Result Value Ref Range Status   Specimen Description   Final    URINE, CLEAN CATCH Performed at [facility], [address] [place], [place] [address]    Special Requests   Final    NONE Performed at [facility], [address], [place], [place] [address]    Culture   Final    NO GROWTH Performed at [facility], [address], [place], [place] [address]    Report Status 09/08/2023 FINAL  Final   Respiratory (~20 pathogens) panel by PCR     Status: Abnormal   Collection Time: 09/08/23  9:45 AM   Specimen: Nasopharyngeal Swab; Respiratory  Result Value Ref Range Status   Adenovirus NOT DETECTED NOT DETECTED Final   Coronavirus 229E NOT DETECTED NOT DETECTED Final    Comment: (NOTE) The Coronavirus on the Respiratory Panel, DOES NOT test for the novel  Coronavirus (2019 nCoV)    Coronavirus HKU1 NOT DETECTED NOT DETECTED Final   Coronavirus NL63 NOT DETECTED NOT DETECTED Final   Coronavirus OC43 NOT DETECTED NOT DETECTED Final   Metapneumovirus DETECTED (A) NOT DETECTED Final   Rhinovirus / Enterovirus NOT DETECTED NOT DETECTED Final   Influenza A NOT DETECTED NOT DETECTED Final   Influenza B NOT DETECTED NOT DETECTED Final   Parainfluenza Virus 1 NOT DETECTED NOT DETECTED Final   Parainfluenza Virus 2 NOT DETECTED NOT DETECTED Final   Parainfluenza Virus 3 NOT DETECTED NOT DETECTED Final   Parainfluenza Virus 4 NOT DETECTED NOT DETECTED Final   Respiratory Syncytial Virus NOT DETECTED NOT DETECTED Final   Bordetella pertussis NOT DETECTED NOT DETECTED Final   Bordetella Parapertussis NOT DETECTED NOT DETECTED Final   Chlamydophila pneumoniae NOT DETECTED NOT DETECTED Final   Mycoplasma pneumoniae NOT DETECTED  NOT DETECTED Final    Comment: Performed at [facility], [address] [place], [place] [address]  Body fluid culture w Gram Stain     Status: None (Preliminary result)   Collection Time: 09/15/23 10:56 AM   Specimen: PATH Cytology Pleural fluid  Result Value Ref Range Status   Specimen Description   Final    PLEURAL Performed at [facility], [address] [place], [place] [address]    Special Requests   Final    NONE Performed at [facility], [address] [place], [place] [address]    Gram Stain   Final    WBC PRESENT, PREDOMINANTLY PMN NO ORGANISMS SEEN CYTOSPIN SMEAR Performed at [facility], [address],  [place], [place] [address]    Culture PENDING  Incomplete   Report Status PENDING  Incomplete     Labs: CBC: Recent Labs  Lab 09/12/23 0602 09/13/23 0404 09/14/23 0448 09/15/23 0429 09/16/23 0354  WBC 11.3* 7.4 8.8 9.7 9.5  HGB 10.2* 9.3* 10.5* 10.8* 11.4*  HCT 29.2* 27.3* 30.1* 31.9* 33.2*  MCV 85.9 87.2 87.5 86.7 87.6  PLT 197 206 233 269 284   Basic Metabolic Panel: Recent Labs  Lab 09/11/23 0749 09/12/23 0602 09/13/23 0404 09/14/23 0448 09/15/23 0429 09/16/23 0354  NA 138 139 137 136 135 136  K 3.3* 4.4 4.4 4.0 3.6 3.4*  CL 104 106 106 105 102 103  CO2 25 23 21* 24 23 24   GLUCOSE 136* 137* 179* 122* 92 100*  BUN 29* 32* 37* 37* 40* 29*  CREATININE 1.25* 1.32* 1.39* 1.35* 1.36* 1.26*  CALCIUM 7.9* 8.3* 8.2* 8.6* 8.3* 8.3*  MG 2.1  --  1.7 1.7 1.7 1.7  PHOS 2.7  --  1.9* 3.9 2.7 3.1   Liver Function Tests: No results for input(s): "AST", "ALT", "ALKPHOS", "BILITOT", "PROT", "ALBUMIN" in the last 168 hours. No results for input(s): "LIPASE", "AMYLASE" in the last 168 hours. No results for input(s): "AMMONIA" in the last 168 hours. Cardiac Enzymes: No results for input(s): "CKTOTAL", "CKMB", "CKMBINDEX", "TROPONINI" in the last 168 hours. BNP (last 3 results) No results for input(s): "BNP" in the last 8760 hours. CBG: Recent Labs  Lab 09/09/23 1619 09/09/23 1936 09/09/23 2334 09/10/23 0348  GLUCAP 152* 209* 180* 181*    Time spent: 35 minutes  Signed:  Althia Atlas  [facility] 09/16/2023 1:09 PM

## 2023-09-16 NOTE — Consult Note (Addendum)
 PHARMACY CONSULT NOTE - ELECTROLYTES  Pharmacy Consult for Electrolyte Monitoring and Replacement   Recent Labs: Height: 5\' 3"  (160 cm) Weight: 52.4 kg (115 lb 8.3 oz) IBW/kg (Calculated) : 52.4 Estimated Creatinine Clearance: 27 mL/min (A) (by C-G formula based on SCr of 1.26 mg/dL (H)). Potassium (mmol/L)  Date Value  09/16/2023 3.4 (L)   Magnesium (mg/dL)  Date Value  30/86/5784 1.7   Calcium (mg/dL)  Date Value  69/62/9528 8.3 (L)   Albumin (g/dL)  Date Value  41/32/4401 3.1 (L)   Phosphorus (mg/dL)  Date Value  02/72/5366 3.1   Sodium (mmol/L)  Date Value  09/16/2023 136    Assessment  Christine Johns is a 83 y.o. female presenting with altered mental status. PMH significant for atrial fibrillation on Eliquis, currently not on rate control, hypertension, dementia, hyperlipidemia. Pharmacy has been consulted to monitor and replace electrolytes. On calcium w/D 2 tabs daily Scr trending down.   Goal of Therapy: Electrolytes WNL (Goal Mag >2.0 and K .4.0  with Afib)  Plan:  Kcl 40 mEq x 1 Mg 4 g IV x 1 F/u with AM labs.   Thank you for allowing pharmacy to be a part of this patient's care.  Trinidad Funk, PharmD Clinical Pharmacist 09/16/2023 7:37 AM

## 2023-09-16 NOTE — TOC Transition Note (Signed)
 Transition of Care [facility]) - Discharge Note   Patient Details  Name: Christine Johns MRN: [id number] Date of Birth: 1940-10-27  Transition of Care [facility]) CM/SW Contact:  Odilia Bennett, LCSW Phone Number: 09/16/2023, 2:09 PM   Clinical Narrative:   Patient has orders to discharge home today. [facility] liaison is aware. Per [employer], patient received a walker in 2021 so insurance will not pay for a new one right now. Daughter is aware. CSW ordered 3-in-1 and it has been delivered to the room. No further concerns. CSW signing off.  Final next level of care: Home w Home Health Services Barriers to Discharge: Barriers Resolved   Patient Goals and CMS Choice Patient states their goals for this hospitalization and ongoing recovery are:: Home with home health   Choice offered to / list presented to : Adult Children      Discharge Placement                Patient to be transferred to facility by: Daughter Name of family member notified: Sheria Patient and family notified of of transfer: 09/16/23  Discharge Plan and Services Additional resources added to the After Visit Summary for     Discharge Planning Services: NA            DME Arranged: 3-N-1 DME Agency: [employer] Date DME Agency Contacted: 09/16/23 Time DME Agency Contacted: 1055 Representative spoke with at DME Agency: Sam Creighton HH Arranged: PT, OT [facility] Agency: [facility] Date [facility] Agency Contacted: 09/16/23   Representative spoke with at [facility] Agency: Louanna Rouse  Social Drivers of Health (SDOH) Interventions SDOH Screenings   Food Insecurity: No Food Insecurity (09/08/2023)  Housing: Low Risk  (09/08/2023)  Transportation Needs: No Transportation Needs (09/08/2023)  Utilities: Not At Risk (09/08/2023)  Financial Resource Strain: Low Risk  (04/29/2023)   Received from [facility]  Social Connections: Moderately Integrated (09/08/2023)  Tobacco Use: Low Risk  (09/07/2023)     Readmission Risk Interventions     09/11/2023   10:53 AM  Readmission Risk Prevention Plan  Transportation Screening Complete  PCP or Specialist Appt within 3-5 Days Complete  HRI or Home Care Consult Complete  Social Work Consult for Recovery Care Planning/Counseling Complete  Palliative Care Screening Not Applicable  Medication Review Oceanographer) Complete

## 2023-09-16 NOTE — Plan of Care (Signed)
  Problem: Pain Managment: Goal: General experience of comfort will improve and/or be controlled Outcome: Progressing   Problem: Safety: Goal: Ability to remain free from injury will improve Outcome: Progressing   Problem: Skin Integrity: Goal: Risk for impaired skin integrity will decrease Outcome: Progressing

## 2023-09-19 LAB — BODY FLUID CULTURE W GRAM STAIN
Culture: NO GROWTH
Culture: NO GROWTH

## 2023-09-21 ENCOUNTER — Emergency Department

## 2023-09-21 ENCOUNTER — Inpatient Hospital Stay
Admission: EM | Admit: 2023-09-21 | Discharge: 2023-09-23 | DRG: 948 | Disposition: A | Discharge status: Home Health | Attending: Hospitalist | Admitting: Hospitalist

## 2023-09-21 ENCOUNTER — Encounter: Payer: Self-pay | Admitting: Radiology

## 2023-09-21 ENCOUNTER — Inpatient Hospital Stay

## 2023-09-21 ENCOUNTER — Other Ambulatory Visit: Payer: Self-pay

## 2023-09-21 DIAGNOSIS — Z880 Allergy status to penicillin: Secondary | ICD-10-CM

## 2023-09-21 DIAGNOSIS — Z882 Allergy status to sulfonamides status: Secondary | ICD-10-CM | POA: Diagnosis not present

## 2023-09-21 DIAGNOSIS — I480 Paroxysmal atrial fibrillation: Secondary | ICD-10-CM | POA: Diagnosis present

## 2023-09-21 DIAGNOSIS — I4719 Other supraventricular tachycardia: Secondary | ICD-10-CM | POA: Diagnosis present

## 2023-09-21 DIAGNOSIS — Z1152 Encounter for screening for COVID-19: Secondary | ICD-10-CM | POA: Diagnosis not present

## 2023-09-21 DIAGNOSIS — Z888 Allergy status to other drugs, medicaments and biological substances status: Secondary | ICD-10-CM | POA: Diagnosis not present

## 2023-09-21 DIAGNOSIS — A419 Sepsis, unspecified organism: Principal | ICD-10-CM

## 2023-09-21 DIAGNOSIS — E872 Acidosis, unspecified: Secondary | ICD-10-CM | POA: Diagnosis present

## 2023-09-21 DIAGNOSIS — R410 Disorientation, unspecified: Secondary | ICD-10-CM | POA: Diagnosis present

## 2023-09-21 DIAGNOSIS — Z79899 Other long term (current) drug therapy: Secondary | ICD-10-CM | POA: Diagnosis not present

## 2023-09-21 DIAGNOSIS — J189 Pneumonia, unspecified organism: Secondary | ICD-10-CM

## 2023-09-21 DIAGNOSIS — N1832 Chronic kidney disease, stage 3b: Secondary | ICD-10-CM | POA: Diagnosis present

## 2023-09-21 DIAGNOSIS — E785 Hyperlipidemia, unspecified: Secondary | ICD-10-CM | POA: Diagnosis present

## 2023-09-21 DIAGNOSIS — R531 Weakness: Secondary | ICD-10-CM | POA: Diagnosis present

## 2023-09-21 DIAGNOSIS — F0390 Unspecified dementia without behavioral disturbance: Secondary | ICD-10-CM | POA: Diagnosis present

## 2023-09-21 DIAGNOSIS — E876 Hypokalemia: Secondary | ICD-10-CM | POA: Diagnosis present

## 2023-09-21 DIAGNOSIS — Z7901 Long term (current) use of anticoagulants: Secondary | ICD-10-CM | POA: Diagnosis not present

## 2023-09-21 DIAGNOSIS — I130 Hypertensive heart and chronic kidney disease with heart failure and stage 1 through stage 4 chronic kidney disease, or unspecified chronic kidney disease: Secondary | ICD-10-CM | POA: Diagnosis present

## 2023-09-21 DIAGNOSIS — I5042 Chronic combined systolic (congestive) and diastolic (congestive) heart failure: Secondary | ICD-10-CM | POA: Diagnosis present

## 2023-09-21 LAB — CBC WITH DIFFERENTIAL/PLATELET
Abs Immature Granulocytes: 0.07 10*3/uL (ref 0.00–0.07)
Basophils Absolute: 0 10*3/uL (ref 0.0–0.1)
Basophils Relative: 0 %
Eosinophils Absolute: 0 10*3/uL (ref 0.0–0.5)
Eosinophils Relative: 0 %
HCT: 33.4 % — ABNORMAL LOW (ref 36.0–46.0)
Hemoglobin: 11 g/dL — ABNORMAL LOW (ref 12.0–15.0)
Immature Granulocytes: 1 %
Lymphocytes Relative: 7 %
Lymphs Abs: 0.9 10*3/uL (ref 0.7–4.0)
MCH: 29.4 pg (ref 26.0–34.0)
MCHC: 32.9 g/dL (ref 30.0–36.0)
MCV: 89.3 fL (ref 80.0–100.0)
Monocytes Absolute: 1.2 10*3/uL — ABNORMAL HIGH (ref 0.1–1.0)
Monocytes Relative: 9 %
Neutro Abs: 10.4 10*3/uL — ABNORMAL HIGH (ref 1.7–7.7)
Neutrophils Relative %: 83 %
Platelets: 241 10*3/uL (ref 150–400)
RBC: 3.74 MIL/uL — ABNORMAL LOW (ref 3.87–5.11)
RDW: 15 % (ref 11.5–15.5)
WBC: 12.6 10*3/uL — ABNORMAL HIGH (ref 4.0–10.5)
nRBC: 0 % (ref 0.0–0.2)

## 2023-09-21 LAB — COMPREHENSIVE METABOLIC PANEL WITH GFR
ALT: 24 U/L (ref 0–44)
AST: 31 U/L (ref 15–41)
Albumin: 3.1 g/dL — ABNORMAL LOW (ref 3.5–5.0)
Alkaline Phosphatase: 51 U/L (ref 38–126)
Anion gap: 10 (ref 5–15)
BUN: 13 mg/dL (ref 8–23)
CO2: 20 mmol/L — ABNORMAL LOW (ref 22–32)
Calcium: 8.8 mg/dL — ABNORMAL LOW (ref 8.9–10.3)
Chloride: 106 mmol/L (ref 98–111)
Creatinine, Ser: 1.31 mg/dL — ABNORMAL HIGH (ref 0.44–1.00)
GFR, Estimated: 40 mL/min — ABNORMAL LOW (ref >60)
Glucose, Bld: 150 mg/dL — ABNORMAL HIGH (ref 70–99)
Potassium: 3.7 mmol/L (ref 3.5–5.1)
Sodium: 136 mmol/L (ref 135–145)
Total Bilirubin: 1 mg/dL (ref 0.0–1.2)
Total Protein: 6.9 g/dL (ref 6.5–8.1)

## 2023-09-21 LAB — URINALYSIS, W/ REFLEX TO CULTURE (INFECTION SUSPECTED)
Bacteria, UA: NONE SEEN
Bilirubin Urine: NEGATIVE
Glucose, UA: NEGATIVE mg/dL
Hgb urine dipstick: NEGATIVE
Ketones, ur: NEGATIVE mg/dL
Leukocytes,Ua: NEGATIVE
Nitrite: NEGATIVE
Protein, ur: NEGATIVE mg/dL
Specific Gravity, Urine: 1.01 (ref 1.005–1.030)
Squamous Epithelial / HPF: 0 /HPF (ref 0–5)
pH: 6 (ref 5.0–8.0)

## 2023-09-21 LAB — LACTIC ACID, PLASMA
Lactic Acid, Venous: 1.7 mmol/L (ref 0.5–1.9)
Lactic Acid, Venous: 2 mmol/L (ref 0.5–1.9)
Lactic Acid, Venous: 2 mmol/L (ref 0.5–1.9)

## 2023-09-21 LAB — TROPONIN I (HIGH SENSITIVITY): Troponin I (High Sensitivity): 32 ng/L — ABNORMAL HIGH (ref <18)

## 2023-09-21 LAB — RESP PANEL BY RT-PCR (RSV, FLU A&B, COVID)  RVPGX2
Influenza A by PCR: NEGATIVE
Influenza B by PCR: NEGATIVE
Resp Syncytial Virus by PCR: NEGATIVE
SARS Coronavirus 2 by RT PCR: NEGATIVE

## 2023-09-21 LAB — MRSA NEXT GEN BY PCR, NASAL: MRSA by PCR Next Gen: NOT DETECTED

## 2023-09-21 LAB — MAGNESIUM: Magnesium: 1.4 mg/dL — ABNORMAL LOW (ref 1.7–2.4)

## 2023-09-21 MED ORDER — ALBUTEROL SULFATE (2.5 MG/3ML) 0.083% IN NEBU
2.5000 mg | INHALATION_SOLUTION | RESPIRATORY_TRACT | Status: DC | PRN
  Administered 2023-09-22: 2.5 mg via RESPIRATORY_TRACT
  Filled 2023-09-21: qty 3

## 2023-09-21 MED ORDER — SODIUM CHLORIDE 0.9 % IV SOLN
2.0000 g | INTRAVENOUS | Status: DC
  Filled 2023-09-21: qty 12.5

## 2023-09-21 MED ORDER — VANCOMYCIN HCL IN DEXTROSE 1-5 GM/200ML-% IV SOLN: 1000.0000 mg | INTRAVENOUS | Status: DC

## 2023-09-21 MED ORDER — SODIUM CHLORIDE 0.9 % IV SOLN
2.0000 g | Freq: Once | INTRAVENOUS | Status: AC
  Administered 2023-09-21: 2 g via INTRAVENOUS
  Filled 2023-09-21: qty 12.5

## 2023-09-21 MED ORDER — LACTATED RINGERS IV BOLUS
1000.0000 mL | Freq: Once | INTRAVENOUS | Status: AC
  Administered 2023-09-21: 1000 mL via INTRAVENOUS

## 2023-09-21 MED ORDER — ACETAMINOPHEN 500 MG PO TABS
1000.0000 mg | ORAL_TABLET | Freq: Once | ORAL | Status: AC
  Administered 2023-09-21: 1000 mg via ORAL
  Filled 2023-09-21: qty 2

## 2023-09-21 MED ORDER — APIXABAN 2.5 MG PO TABS
2.5000 mg | ORAL_TABLET | Freq: Two times a day (BID) | ORAL | Status: DC
  Administered 2023-09-21 – 2023-09-23 (×4): 2.5 mg via ORAL
  Filled 2023-09-21 (×4): qty 1

## 2023-09-21 MED ORDER — POLYETHYLENE GLYCOL 3350 17 G PO PACK
17.0000 g | PACK | Freq: Every day | ORAL | Status: DC
  Filled 2023-09-21 (×3): qty 1

## 2023-09-21 MED ORDER — ACETAMINOPHEN 650 MG RE SUPP: 650.0000 mg | Freq: Four times a day (QID) | RECTAL | Status: DC | PRN

## 2023-09-21 MED ORDER — VANCOMYCIN HCL IN DEXTROSE 1-5 GM/200ML-% IV SOLN
1000.0000 mg | Freq: Once | INTRAVENOUS | Status: AC
  Administered 2023-09-21: 1000 mg via INTRAVENOUS
  Filled 2023-09-21: qty 200

## 2023-09-21 MED ORDER — QUETIAPINE FUMARATE 25 MG PO TABS
12.5000 mg | ORAL_TABLET | Freq: Every day | ORAL | Status: DC
  Administered 2023-09-21: 12.5 mg via ORAL
  Filled 2023-09-21: qty 1

## 2023-09-21 MED ORDER — METOPROLOL TARTRATE 25 MG PO TABS
25.0000 mg | ORAL_TABLET | Freq: Two times a day (BID) | ORAL | Status: DC
  Administered 2023-09-21 – 2023-09-23 (×4): 25 mg via ORAL
  Filled 2023-09-21 (×4): qty 1

## 2023-09-21 MED ORDER — ONDANSETRON HCL 4 MG PO TABS: 4.0000 mg | ORAL_TABLET | Freq: Four times a day (QID) | ORAL | Status: DC | PRN

## 2023-09-21 MED ORDER — BISACODYL 5 MG PO TBEC: 5.0000 mg | DELAYED_RELEASE_TABLET | Freq: Every day | ORAL | Status: DC | PRN

## 2023-09-21 MED ORDER — ACETAMINOPHEN 325 MG PO TABS: 650.0000 mg | ORAL_TABLET | Freq: Four times a day (QID) | ORAL | Status: DC | PRN

## 2023-09-21 MED ORDER — METRONIDAZOLE 500 MG/100ML IV SOLN
500.0000 mg | Freq: Once | INTRAVENOUS | Status: AC
  Administered 2023-09-21: 500 mg via INTRAVENOUS
  Filled 2023-09-21: qty 100

## 2023-09-21 MED ORDER — GADOBUTROL 1 MMOL/ML IV SOLN
5.0000 mL | Freq: Once | INTRAVENOUS | Status: AC | PRN
  Administered 2023-09-21: 5 mL via INTRAVENOUS

## 2023-09-21 MED ORDER — MAGNESIUM SULFATE 2 GM/50ML IV SOLN
2.0000 g | Freq: Once | INTRAVENOUS | Status: AC
  Administered 2023-09-21: 2 g via INTRAVENOUS
  Filled 2023-09-21: qty 50

## 2023-09-21 MED ORDER — ONDANSETRON HCL 4 MG/2ML IJ SOLN: 4.0000 mg | Freq: Four times a day (QID) | INTRAMUSCULAR | Status: DC | PRN

## 2023-09-21 NOTE — ED Provider Notes (Signed)
 [facility] Provider Note    Event Date/Time   First MD Initiated Contact with Patient 09/21/23 1524     (approximate)   History   Chief Complaint Weakness ( )   HPI  Christine Johns is a 83 y.o. female with past medical history of hypertension, hyperlipidemia, atrial fibrillation on Eliquis , CKD, and dementia who presents to the ED for generalized weakness.  History is obtained from daughter at bedside, who reports that patient was admitted to the hospital for pneumonia secondary to metapneumovirus last week, subsequently discharged 5 days ago.  Daughter states that patient was initially doing well at home and ambulating with a walker, but over the past 24 hours has seemed to suddenly decline and been unable to walk.  She has been more confused than usual during this time, and has reported headache to her daughter.  Patient currently denies any chest pain, shortness of breath, abdominal pain, nausea, diarrhea, or dysuria.     Physical Exam   Triage Vital Signs: ED Triage Vitals [09/21/23 1512]  Encounter Vitals Group     BP (!) 149/106     Systolic BP Percentile      Diastolic BP Percentile      Pulse Rate (!) 144     Resp 17     Temp 100.2 F (37.9 C)     Temp Source Oral     SpO2 95 %     Weight      Height      Head Circumference      Peak Flow      Pain Score      Pain Loc      Pain Education      Exclude from Growth Chart     Most recent vital signs: Vitals:   09/21/23 1700 09/21/23 1800  BP: 139/83   Pulse: 89   Resp: (!) 21   Temp:  99.4 F (37.4 C)  SpO2: 98%     Constitutional: Alert and oriented to person, but not place, time, or situation. Eyes: Conjunctivae are normal. Head: Atraumatic. Nose: No congestion/rhinnorhea. Mouth/Throat: Mucous membranes are moist.  Neck: Supple with no meningismus. Cardiovascular: Tachycardic, irregularly irregular rhythm. Grossly normal heart sounds.  2+ radial pulses  bilaterally. Respiratory: Normal respiratory effort.  No retractions. Lungs CTAB. Gastrointestinal: Soft and nontender. No distention. Musculoskeletal: No lower extremity tenderness nor edema.  Neurologic:  Normal speech and language. No gross focal neurologic deficits are appreciated.    ED Results / Procedures / Treatments   Labs (all labs ordered are listed, but only abnormal results are displayed) Labs Reviewed  LACTIC ACID, PLASMA - Abnormal; Notable for the following components:      Result Value   Lactic Acid, Venous 2.0 (*)    All other components within normal limits  COMPREHENSIVE METABOLIC PANEL WITH GFR - Abnormal; Notable for the following components:   CO2 20 (*)    Glucose, Bld 150 (*)    Creatinine, Ser 1.31 (*)    Calcium  8.8 (*)    Albumin 3.1 (*)    GFR, Estimated 40 (*)    All other components within normal limits  CBC WITH DIFFERENTIAL/PLATELET - Abnormal; Notable for the following components:   WBC 12.6 (*)    RBC 3.74 (*)    Hemoglobin 11.0 (*)    HCT 33.4 (*)    Neutro Abs 10.4 (*)    Monocytes Absolute 1.2 (*)    All other components within  normal limits  URINALYSIS, W/ REFLEX TO CULTURE (INFECTION SUSPECTED) - Abnormal; Notable for the following components:   Color, Urine AMBER (*)    APPearance CLOUDY (*)    All other components within normal limits  MAGNESIUM  - Abnormal; Notable for the following components:   Magnesium  1.4 (*)    All other components within normal limits  LACTIC ACID, PLASMA - Abnormal; Notable for the following components:   Lactic Acid, Venous 2.0 (*)    All other components within normal limits  TROPONIN I (HIGH SENSITIVITY) - Abnormal; Notable for the following components:   Troponin I (High Sensitivity) 32 (*)    All other components within normal limits  RESP PANEL BY RT-PCR (RSV, FLU A&B, COVID)  [id number]  CULTURE, BLOOD (ROUTINE X 2)  CULTURE, BLOOD (ROUTINE X 2)  LACTIC ACID, PLASMA     EKG  ED ECG REPORT I,  Twilla Galea, the attending physician, personally viewed and interpreted this ECG.   Date: 09/21/2023  EKG Time: 15:17  Rate: 129  Rhythm: Atrial fibrillation  Axis: Normal  Intervals:right bundle branch block  ST&T Change: Nonspecific T wave abnormality  RADIOLOGY CT head reviewed and interpreted by me with no hemorrhage or midline shift.  PROCEDURES:  Critical Care performed: No  Procedures   MEDICATIONS ORDERED IN ED: Medications  vancomycin  (VANCOCIN ) IVPB 1000 mg/200 mL premix (has no administration in time range)  magnesium  sulfate IVPB 2 g 50 mL (2 g Intravenous New Bag/Given 09/21/23 1758)  lactated ringers  bolus 1,000 mL (has no administration in time range)  lactated ringers  bolus 1,000 mL (1,000 mLs Intravenous New Bag/Given 09/21/23 1640)  ceFEPIme  (MAXIPIME ) 2 g in sodium chloride  0.9 % 100 mL IVPB (0 g Intravenous Stopped 09/21/23 1713)  metroNIDAZOLE  (FLAGYL ) IVPB 500 mg (0 mg Intravenous Stopped 09/21/23 1842)  acetaminophen  (TYLENOL ) tablet 1,000 mg (1,000 mg Oral Given 09/21/23 1643)     IMPRESSION / MDM / ASSESSMENT AND PLAN / ED COURSE  I reviewed the triage vital signs and the nursing notes.                              83 y.o. female with past medical history of hypertension, hyperlipidemia, atrial fibrillation on Eliquis , CKD, and dementia who presents to the ED for generalized weakness and increased confusion over the past 24 hours.  Patient's presentation is most consistent with acute presentation with potential threat to life or bodily function.  Differential diagnosis includes, but is not limited to, sepsis, UTI, pneumonia, viral illness, dehydration, electrolyte abnormality, AKI, intracranial process.  Patient nontoxic-appearing and in no acute distress, vital signs remarkable for tachycardia but otherwise reassuring.  EKG shows atrial fibrillation with RVR, heart rate does improve you are approved in the room and around 110.  Patient noted to have  borderline fever at 100.2, and presentation concerning for sepsis.  We will start on broad-spectrum antibiotics, initiate sepsis workup with blood cultures, chest x-ray, and lactic acid.  Will treat fever with Tylenol  and hydrate with IV fluids, hold off on rate controlling medication for now.  She complains of headache but has a nonfocal neurologic exam and no findings concerning for meningitis.  Will further assess with CT imaging of her head, chest x-ray and labs are pending.  CT head is negative for acute process, chest x-ray concerning for bilateral pneumonia, progressed compared to previous.  Labs show mild leukocytosis but no significant anemia, renal function stable compared  to previous with hypomagnesemia, which we will replete.  LFTs are unremarkable, troponin mildly elevated but downtrending compared to previous.  Patient has received broad-spectrum IV antibiotics and case discussed with hospitalist for admission.      FINAL CLINICAL IMPRESSION(S) / ED DIAGNOSES   Final diagnoses:  Sepsis without acute organ dysfunction, due to unspecified organism [facility])  Pneumonia of both lower lobes due to infectious organism     Rx / DC Orders   ED Discharge Orders     None        Note:  This document was prepared using Dragon voice recognition software and may include unintentional dictation errors.   Twilla Galea, MD 09/21/23 [phone]

## 2023-09-21 NOTE — ED Notes (Signed)
 Phlebotomy collected second blood culture before first antibiotic was started

## 2023-09-21 NOTE — ED Triage Notes (Signed)
 Per daughter, pt woke up today and she was not making sense when they tried to have conversation with her. She was complaining of a headache. She was unable to walk with her walker today. Pt was admitted earlier this month for UTI.

## 2023-09-21 NOTE — Sepsis Progress Note (Signed)
 Elink following code sepsis

## 2023-09-21 NOTE — ED Notes (Signed)
 Patient transported to MRI

## 2023-09-21 NOTE — Consult Note (Signed)
 CODE SEPSIS - PHARMACY COMMUNICATION  **Broad Spectrum Antibiotics should be administered within 1 hour of Sepsis diagnosis**  Time Code Sepsis Called/Page Received: 1546  Antibiotics Ordered: cefepime , vancomycin , Flagyl   Time of 1st antibiotic administration: 1639  Additional action taken by pharmacy: N/A   Ramonita Burow ,PharmD Clinical Pharmacist  09/21/2023  3:47 PM

## 2023-09-21 NOTE — ED Notes (Signed)
 Patient transported to CT

## 2023-09-21 NOTE — H&P (Addendum)
 History and Physical  Christine Johns NWG:[id number] DOB: [birth year] DOA: 09/21/2023 PCP: Monique Ano, MD  Chief Complaint: Weakness Historian: patient, daughter, son-in-law  HPI:  Christine Johns is a 83 y.o. female with a PMH significant for recent admission with multilobar pneumonia due to metapneumovirus discharged on 4/15, multifocal atrial tachycardia/atrial fibrillation, combined systolic and diastolic CHF, HTN, stage IIIb CKD, HLD, anemia, dementia. At baseline, they live with her husband and are ambulatory without need of assistance device.  Daughter endorses that patient at baseline is only oriented to herself.  She is able to go to the bathroom on her own but does not do any cooking.  She has in-home care provider as well as receives [employer] Dash meals frequently.  They presented from home to the ED on 09/21/2023 with acute onset weakness beginning upon waking up around 10 AM today.  The daughter provides much of the history as patient is not oriented at this time.  Daughter states that since discharge for metapneumovirus pneumonia patient has been doing relatively well at home and getting her strength back until today.  Patient woke up around 10 AM which is much later than her baseline.  She complained of a headache and overall weakness/tiredness.  She spent most of the day sleeping and did not get out of bed much.  Daughter notes that she was not talking as much as usual and when she did her speech was slurred.  Did not appear to have any facial drooping.  Did not appear to have any focal limb weakness.  Patient was able to eat a full breakfast but has not had any appetite or eaten anything since then. Patient has had nighttime cough since discharge but last night she had improved symptoms and denies any rhinorrhea, cough, shortness of breath today. Patient has been taking her discharge medications as instructed including Eliquis . At the time of my encounter, patient denies any pain,  shortness of breath, nausea, concerns other than feeling tired.  She is oriented to herself but does not know where we are located and when asked what day it is she said "it is health day".  Her son-in-law at bedside states that that is her baseline orientation.  In the ED, it was found that they had temperature 100.2, heart rate initially 144 and improved to 90s with IV fluids.  Appears to be in atrial fibrillation on monitor.  Normal respiratory rate and O2 sats are in high 90% on room air.  Blood pressure 140/80.  Significant findings included: LA 1.7>2.0>2.0.  Urinalysis shows no signs of infection but potentially dehydration.  Blood cultures were collected.  Negative for coronavirus, influenza, RSV.  Troponin 32 which is decreased from prior admission up to 78.  WBC 12.6, Hgb 11, platelets 241. Chest x-ray:1. Peribronchial cuffing bilaterally with patchy airspace disease at the lung bases, increased from the prior exam and concerning for pneumonia. 2. Wedge-shaped compression deformity at T12, indeterminate in age. Head CT without contrast: Atrophy and chronic small vessel ischemic changes.  Multiple chronic lacunar CVAs.  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  They were initially treated with Tylenol , cefepime , LR, Flagyl , vancomycin .   Patient was admitted to medicine service for further workup and management of generalized weakness as outlined in detail below.  Assessment/Plan Principal Problem:   Weakness   Sepsis-patient meets technical criteria for sepsis with mild leukocytosis, lactic acidosis and tachycardia with evidence of worsening airspace disease on chest x-ray.  However, heart rate attributed to atrial fibrillation and chest  x-ray findings can be consistent with expected evolution of metapneumovirus pneumonia imaging postinfection.  She exhibits no respiratory symptoms or complaints and is saturating in high 90s on room air.  Remains negative for RSV, COVID, influenza. Negative for signs  of infection on skin, urinalysis negative, non-acute abdomen nor GI symptoms, and no signs of CNS infection.  - Plan to empirically treat for superimposed bacterial infection with IV antibiotics.  She did complete 5-day antibiotic course on prior admission (azithromycin  and ceftriaxone ) so we will broaden.  MRSA swab pending and if returns negative can stop vancomycin . - Follow-up blood cultures - Monitor for fever - Respiratory pathogen panel ordered - continue IV fluids overnight  Update: brain MRI negative for acute stroke  Acute onset weakness with described language abnormalities.  Family describes abnormal language from baseline.  No focal weaknesses on exam, patient is able to follow simple commands and is at baseline orientation.  Head CT negative for acute infarct however, given high risk with her ongoing atrial fibrillation and evidence of multiple chronic lacunar infarcts on head CT, will proceed with MRI brain rule out and obtain neurology consult as needed.  Weakness may also be attributed to septic pathology and poor p.o. intake today. - Follow-up brain MRI - PT/OT/SLP  Multifocal atrial tachycardia's/SVT H/o Atrial fibrillation: currently rate controlled in 80s to 90s and in atrial fibrillation on monitor.  Denies chest pain or palpitations. - Resume Home Eliquis  - Continue home Lopressor  25 mg twice daily   Chronic combined systolic and diastolic CHF exacerbation TTE LVEF 45 to 50%, LV global hypokinesis, grade 2 diastolic dysfunction.  Moderate MR.  Does not appear to be in acute exacerbation. - Strict I's/O given she is receiving IV fluids for her sepsis - Lopressor  25 mg twice daily    Essential hypertension: family concerned that her blood pressure was low on arrival.  Appears to be normalized on my exam after she has received some IV fluids.  Of note, several of her blood pressure medications were held during her recent admission due to soft blood pressures and those were  restarted at discharge.  Some of her weakness may be related to polypharmacy and I will hold her blood pressure medications at this time and continue to monitor them while inpatient. - Hold home lisinopril , amlodipine     Stage 3b chronic kidney disease: Creatinine appears to be at baseline of 1.3 on presentation.   HLD: Lovastatin equivalent, pravastatin  40 mg nightly given during hospital stay.   History of normocytic normochromic anemia: At baseline  Dementia-Per family, oriented to self and able to follow commands as well as ambulatory is her baseline.  She has not been able to ambulate independently today but is at mental baseline.  She is dependent on ADLs and has an in-home caregiver. - Was discharged with 12.5 mg nightly Seroquel  -Delirium precautions  Past Medical History:  Diagnosis Date   Chronic kidney disease    Dementia (HCC)    Hyperlipidemia    Hypertension    Paroxysmal atrial fibrillation (HCC)     No past surgical history on file.   reports that she has never smoked. She has never used smokeless tobacco. She reports that she does not drink alcohol and does not use drugs.  Allergies  Allergen Reactions   Atorvastatin Other (See Comments)   Penicillins Itching and Swelling   Sulfa Antibiotics Itching and Swelling    Family History  Problem Relation Age of Onset   Macular degeneration Brother  Breast cancer Neg Hx     Prior to Admission medications   Medication Sig Start Date End Date Taking? Authorizing Provider  apixaban  (ELIQUIS ) 2.5 MG TABS tablet Take 1 tablet (2.5 mg total) by mouth 2 (two) times daily. 05/02/23   Cleven [place], DO  Calcium  Carb-Cholecalciferol  600-10 MG-MCG TABS Take 2 tablets by mouth daily. 08/29/23   [provider]  Cholecalciferol  (VITAMIN D ) 125 MCG (5000 UT) CAPS Take 1 capsule by mouth daily.    [provider]  Dextromethorphan -guaiFENesin  20-200 MG/20ML LIQD Take 20 mLs by mouth every 4 (four) hours as  needed. 09/16/23   Althia Atlas, MD  lisinopril  (ZESTRIL ) 10 MG tablet Take 10 mg by mouth daily.    [provider]  lovastatin (MEVACOR) 40 MG tablet Take 40 mg by mouth daily.    [provider]  metoprolol  tartrate (LOPRESSOR ) 25 MG tablet Take 1 tablet (25 mg total) by mouth 2 (two) times daily. 09/16/23 03/14/24  Althia Atlas, MD  Multiple Vitamin (MULTI-VITAMIN) tablet Take 1 tablet by mouth daily.    [provider]  pantoprazole  (PROTONIX ) 40 MG tablet Take 1 tablet (40 mg total) by mouth daily for 7 days. 09/17/23 09/24/23  Althia Atlas, MD  polyethylene glycol powder (GLYCOLAX /MIRALAX ) 17 GM/SCOOP powder Take 17 g by mouth daily. Mix powder as directed. 09/16/23   Althia Atlas, MD  QUEtiapine  (SEROQUEL ) 25 MG tablet Take 0.5 tablets (12.5 mg total) by mouth every evening. 09/16/23 10/16/23  Althia Atlas, MD   I have personally, briefly reviewed patient's prior medical records in [facility]  Objective: Blood pressure 139/83, pulse 89, temperature 99.4 F (37.4 C), temperature source Oral, resp. rate (!) 21, SpO2 98%.   Constitutional: NAD, calm, comfortable HEENT: MMM. Posterior pharynx clear of any exudate or lesions. Normal dentition.  Neck: normal, supple, no masses, no thyromegaly. No lymphadenopathy.  Respiratory: CTAB, no wheezing, no crackles. Normal respiratory effort. No accessory muscle use.  Cardiovascular: irregular rhythm, systolic murmur. No extremity edema. 2+ pedal pulses. no clubbing / cyanosis.  Abdomen: soft, NT, ND, no masses or HSM palpated. Musculoskeletal: No joint deformity upper and lower extremities. Normal muscle tone.  Skin: dry, intact, normal color, normal temperature on exposed skin Neurologic: Alert and oriented to self. Unable to identify the day or where she is. Negative dysarthria. Grossly non-focal exam. PERRL Psychiatric: Normal mood. Congruent affect.  Labs on Admission: I have personally reviewed admission labs  and imaging studies  CBC    Component Value Date/Time   WBC 12.6 (H) 09/21/2023 1516   RBC 3.74 (L) 09/21/2023 1516   HGB 11.0 (L) 09/21/2023 1516   HCT 33.4 (L) 09/21/2023 1516   PLT 241 09/21/2023 1516   MCV 89.3 09/21/2023 1516   MCH 29.4 09/21/2023 1516   MCHC 32.9 09/21/2023 1516   RDW 15.0 09/21/2023 1516   LYMPHSABS 0.9 09/21/2023 1516   MONOABS 1.2 (H) 09/21/2023 1516   EOSABS 0.0 09/21/2023 1516   BASOSABS 0.0 09/21/2023 1516   CMP     Component Value Date/Time   NA 136 09/21/2023 1516   K 3.7 09/21/2023 1516   CL 106 09/21/2023 1516   CO2 20 (L) 09/21/2023 1516   GLUCOSE 150 (H) 09/21/2023 1516   BUN 13 09/21/2023 1516   CREATININE 1.31 (H) 09/21/2023 1516   CALCIUM  8.8 (L) 09/21/2023 1516   PROT 6.9 09/21/2023 1516   ALBUMIN 3.1 (L) 09/21/2023 1516   AST 31 09/21/2023 1516   ALT 24  09/21/2023 1516   ALKPHOS 51 09/21/2023 1516   BILITOT 1.0 09/21/2023 1516   GFRNONAA 40 (L) 09/21/2023 1516    Radiological Exams on Admission: CT Head Wo Contrast Result Date: 09/21/2023 CLINICAL DATA:  Headache, new onset (Age >= 51y) EXAM: CT HEAD WITHOUT CONTRAST TECHNIQUE: Contiguous axial images were obtained from the base of the skull through the vertex without intravenous contrast. RADIATION DOSE REDUCTION: This exam was performed according to the departmental dose-optimization program which includes automated exposure control, adjustment of the mA and/or kV according to patient size and/or use of iterative reconstruction technique. COMPARISON:  09/07/2023. FINDINGS: Brain: There is periventricular white matter decreased attenuation consistent with small vessel ischemic changes. Ventricles, sulci and cisterns are prominent consistent with age related involutional changes. No acute intracranial hemorrhage, mass effect or shift. No hydrocephalus. Encephalomalacia consistent with a numerous bilateral cerebellar and basal ganglia chronic lacunar CVAs. Vascular: No hyperdense vessel  or unexpected calcification. Skull: Normal. Negative for fracture or focal lesion. Sinuses/Orbits: Mucoperiosteal thickening consistent with chronic ethmoid sinusitis. Left maxillary antrum air-fluid level consistent with acute sinusitis. IMPRESSION: 1. Atrophy and chronic small vessel ischemic changes. Multiple chronic lacunar CVAs. 2. 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 Electronically Signed   By: Sydell Eva M.D.   On: 09/21/2023 18:41   DG Chest 2 View Result Date: 09/21/2023 CLINICAL DATA:  Fever, altered mental status. EXAM: CHEST - 2 VIEW COMPARISON:  09/16/2023. FINDINGS: The heart is enlarged and mediastinal contours are within normal limits. There is atherosclerotic calcification of the aorta. Peribronchial cuffing is noted bilaterally and there is mild airspace disease at the lung bases, increased from the prior exam. No effusion or pneumothorax is seen. A compression deformity is noted at T12 with loss of vertebral body height of 50% anteriorly. IMPRESSION: 1. Peribronchial cuffing bilaterally with patchy airspace disease at the lung bases, increased from the prior exam and concerning for pneumonia. 2. Wedge-shaped compression deformity at T12, indeterminate in age. Electronically Signed   By: Wyvonnia Heimlich M.D.   On: 09/21/2023 16:42   EKG: Independently reviewed. Atrial fibrillation vs multifocal tachycardia. Rate 129  DVT prophylaxis: SCDs Start: 09/21/23 1948, eliquis    Code Status: full as confirmed with son in law at bedside and with comparison to previous choices  Family Communication: son in law at bedside and daughter on phone throughout encounter  Disposition Plan: admit to med-surg  Consults called: none    Ree Candy, DO [facility]  09/21/2023, 8:24 PM    To contact the appropriate [facility] Attending or Consulting provider: Check [email or web address] for coverage from 7pm-7am

## 2023-09-21 NOTE — Consult Note (Signed)
 Pharmacy Antibiotic Note  Christine Johns is a 83 y.o. female admitted on 09/21/2023 with pneumonia.  Pharmacy has been consulted for cefepime  and vancomycin  dosing. Tmax 100.2, lactic acid 2, WBC 12.6. Scr at baseline.   CXR: Peribronchial cuffing bilaterally with patchy airspace disease at the lung bases, increased from the prior exam and concerning for pneumonia.  Plan: Will start Cefepime  2 g daily.   Pt received vancomycin  1000 mg x 1 followed by 1000 mg q48H. Predicted AUC of 510. Goal AUC of 400-600. Plan to obtain vancomycin  level after the 3rd dose.      Temp (24hrs), Avg:99.8 F (37.7 C), Min:99.4 F (37.4 C), Max:100.2 F (37.9 C)  Recent Labs  Lab 09/15/23 0429 09/16/23 0354 09/21/23 1516 09/21/23 1638 09/21/23 1809  WBC 9.7 9.5 12.6*  --   --   CREATININE 1.36* 1.26* 1.31*  --   --   LATICACIDVEN  --   --  1.7 2.0* 2.0*    Estimated Creatinine Clearance: 26 mL/min (A) (by C-G formula based on SCr of 1.31 mg/dL (H)).    Allergies  Allergen Reactions   Atorvastatin Other (See Comments)   Penicillins Itching and Swelling   Sulfa Antibiotics Itching and Swelling    Antimicrobials this admission: 4/20 cefepime  >>  4/20 vancomycin  >>   Dose adjustments this admission: None  Microbiology results: 4/20 BCx: pending   Thank you for allowing pharmacy to be a part of this patient's care.  Trinidad Funk, PharmD, BCPS 09/21/2023 8:09 PM

## 2023-09-22 DIAGNOSIS — R531 Weakness: Secondary | ICD-10-CM | POA: Diagnosis not present

## 2023-09-22 LAB — CBC
HCT: 25.6 % — ABNORMAL LOW (ref 36.0–46.0)
Hemoglobin: 8.8 g/dL — ABNORMAL LOW (ref 12.0–15.0)
MCH: 30.9 pg (ref 26.0–34.0)
MCHC: 34.4 g/dL (ref 30.0–36.0)
MCV: 89.8 fL (ref 80.0–100.0)
Platelets: 192 10*3/uL (ref 150–400)
RBC: 2.85 MIL/uL — ABNORMAL LOW (ref 3.87–5.11)
RDW: 14.8 % (ref 11.5–15.5)
WBC: 9.5 10*3/uL (ref 4.0–10.5)
nRBC: 0 % (ref 0.0–0.2)

## 2023-09-22 LAB — BASIC METABOLIC PANEL WITH GFR
Anion gap: 9 (ref 5–15)
BUN: 15 mg/dL (ref 8–23)
CO2: 22 mmol/L (ref 22–32)
Calcium: 8.4 mg/dL — ABNORMAL LOW (ref 8.9–10.3)
Chloride: 108 mmol/L (ref 98–111)
Creatinine, Ser: 1.19 mg/dL — ABNORMAL HIGH (ref 0.44–1.00)
GFR, Estimated: 45 mL/min — ABNORMAL LOW (ref >60)
Glucose, Bld: 106 mg/dL — ABNORMAL HIGH (ref 70–99)
Potassium: 3.1 mmol/L — ABNORMAL LOW (ref 3.5–5.1)
Sodium: 139 mmol/L (ref 135–145)

## 2023-09-22 LAB — RESPIRATORY PANEL BY PCR

## 2023-09-22 LAB — PROCALCITONIN: Procalcitonin: 0.35 ng/mL

## 2023-09-22 LAB — MAGNESIUM: Magnesium: 1.8 mg/dL (ref 1.7–2.4)

## 2023-09-22 MED ORDER — DM-GUAIFENESIN ER 30-600 MG PO TB12
1.0000 | ORAL_TABLET | Freq: Two times a day (BID) | ORAL | Status: DC | PRN
  Administered 2023-09-23: 1 via ORAL
  Filled 2023-09-22 (×2): qty 1

## 2023-09-22 MED ORDER — ENSURE ENLIVE PO LIQD
237.0000 mL | Freq: Three times a day (TID) | ORAL | Status: DC
  Administered 2023-09-22 – 2023-09-23 (×3): 237 mL via ORAL

## 2023-09-22 MED ORDER — SODIUM CHLORIDE 0.9 % IV SOLN: INTRAVENOUS | Status: AC

## 2023-09-22 MED ORDER — MAGNESIUM SULFATE 2 GM/50ML IV SOLN
2.0000 g | Freq: Once | INTRAVENOUS | Status: AC
  Administered 2023-09-22: 2 g via INTRAVENOUS
  Filled 2023-09-22: qty 50

## 2023-09-22 MED ORDER — POTASSIUM CHLORIDE CRYS ER 20 MEQ PO TBCR
40.0000 meq | EXTENDED_RELEASE_TABLET | Freq: Once | ORAL | Status: AC
  Administered 2023-09-22: 40 meq via ORAL
  Filled 2023-09-22: qty 2

## 2023-09-22 NOTE — Progress Notes (Signed)
 SLP Cancellation Note  Patient Details Name: Christine Johns MRN: [id number] DOB: 1940/06/21   Cancelled treatment:       Reason Eval/Treat Not Completed: SLP screened, no needs identified, will sign off. Chart review completed, orders received for speech language eval. MRI revealing " No evidence of acute intracranial abnormality. Remote infarcts and chronic microvascular ischemic disease." Pt resting in recliner upon therapist entrance. Daughter present, reporting that pt's mentation is now approximating baseline. No further acute SLP services indicated at this time.   [place] Rehaan Viloria Clapp, MS, CCC-SLP Speech Language Pathologist Rehab Services; [facility] [phone] (ascom)    [place] J Clapp 09/22/2023, 12:57 PM

## 2023-09-22 NOTE — Evaluation (Signed)
 Occupational Therapy Evaluation Patient Details Name: Christine Johns MRN: [id number] DOB: Jul 19, 1940 Today's Date: 09/22/2023   History of Present Illness   Pt is an 83 year old female presenting with AMS, work up includes Multilobar pneumonia due to hyponatremia    PMH significant for atrial fibrillation on Eliquis , currently not on rate control, hypertension, dementia, hyperlipidemia   Clinical Impressions Pt was seen for OT evaluation this date. Pt received in chair, agreeable, and oriented to self. Pt demonstrated ability to complete doffing/donning socks while seated in recliner independently. CGA-MIN A for completing LB dressing in standing 2/2 mild balance deficits. Pt required VC for hand placement to improve transfer technique and HHA in standing. Pt ambulated in room with CGA + holding onto IV pole then transitioned to RW and pt endorsing feeling better with the RW. Pt presents to acute OT demonstrating impaired ADL performance and functional mobility 2/2 decreased strength, balance, activity tolerance, and cognition (See OT problem list for additional functional deficits). Pt currently requires CGA for ADL mobility with RW, PRN MIN A for LB ADL tasks. Pt would benefit from skilled OT services to address noted impairments and functional limitations (see below for any additional details) in order to maximize safety and independence while minimizing falls risk and caregiver burden.    If plan is discharge home, recommend the following:   A little help with walking and/or transfers;A little help with bathing/dressing/bathroom;Direct supervision/assist for medications management     Functional Status Assessment   Patient has had a recent decline in their functional status and demonstrates the ability to make significant improvements in function in a reasonable and predictable amount of time.     Equipment Recommendations   BSC/3in1;Other (comment) (2WW)       Precautions/Restrictions   Precautions Precautions: Fall Restrictions Weight Bearing Restrictions Per Provider Order: No     Mobility Bed Mobility      General bed mobility comments: NT, in recliner    Transfers Overall transfer level: Needs assistance Equipment used: Rolling walker (2 wheels) Transfers: Sit to/from Stand Sit to Stand: Contact guard assist    General transfer comment: VC for hand placement      Balance Overall balance assessment: Needs assistance Sitting-balance support: No upper extremity supported, Feet supported Sitting balance-Leahy Scale: Good     Standing balance support: Single extremity supported, Bilateral upper extremity supported, During functional activity, Reliant on assistive device for balance Standing balance-Leahy Scale: Fair Standing balance comment: fair with UE support on IV pole, improving to fair+ with RW       ADL either performed or assessed with clinical judgement   ADL Overall ADL's : Needs assistance/impaired      Toilet Transfer: Contact guard assist;Cueing for safety;Cueing for sequencing;Rolling walker (2 wheels)         Pertinent Vitals/Pain Pain Assessment Pain Assessment: No/denies pain     Extremity/Trunk Assessment Upper Extremity Assessment Upper Extremity Assessment: Generalized weakness   Lower Extremity Assessment Lower Extremity Assessment: Generalized weakness       Communication Communication Communication: No apparent difficulties   Cognition Arousal: Alert Behavior During Therapy: WFL for tasks assessed/performed Cognition: History of cognitive impairments             OT - Cognition Comments: oriented to self, pleasant, follows simple commands well      Following commands: Intact Following commands impaired: Only follows one step commands consistently, Follows multi-step commands with increased time     Cueing  General Comments  Cueing Techniques: Verbal cues               Home Living Family/patient expects to be discharged to:: Private residence Living Arrangements: Spouse/significant other Available Help at Discharge: Family;Available 24 hours/day (3-4x per week 4 hrs per day; other familiy members will check in) Type of Home: House Home Access: Stairs to enter [employer] of Steps: 2 Entrance Stairs-Rails: Can reach both Home Layout: One level     Bathroom Shower/Tub: Tub/shower unit;Sponge bathes at baseline   [employer]: Standard         Additional Comments: Pt reports having a shower chair and rollator, no family there to verify      Prior Functioning/Environment Prior Level of Function : History of Falls (last six months)      Mobility Comments: amb with no AD ADLs Comments: supervision for ADLs, assist from family for IADLs    OT Problem List: Decreased strength;Decreased activity tolerance;Impaired balance (sitting and/or standing);Decreased cognition;Decreased knowledge of use of DME or AE   OT Treatment/Interventions: Self-care/ADL training;Therapeutic exercise;Patient/family education;Balance training;Therapeutic activities;Energy conservation;DME and/or AE instruction      OT Goals(Current goals can be found in the care plan section)   Acute Rehab OT Goals Patient Stated Goal: feel better OT Goal Formulation: With patient Time For Goal Achievement: 10/06/23 Potential to Achieve Goals: Good ADL Goals Additional ADL Goal #1: Pt will complete all aspects of dressing and toileting as part of morning ADL routine with remote supervision, 2/2 opportunities. Additional ADL Goal #2: Pt will complete morning grooming routine at sink with supv 2/2 opportunities.   OT Frequency:  Min 2X/week       AM-PAC OT "6 Clicks" Daily Activity     Outcome Measure Help from another person eating meals?: None Help from another person taking care of personal grooming?: A Little Help from another person toileting, which  includes using toliet, bedpan, or urinal?: A Little Help from another person bathing (including washing, rinsing, drying)?: A Little Help from another person to put on and taking off regular upper body clothing?: A Little Help from another person to put on and taking off regular lower body clothing?: A Little 6 Click Score: 19   End of Session Equipment Utilized During Treatment: Gait belt;Rolling walker (2 wheels)  Activity Tolerance: Patient tolerated treatment well Patient left: in chair;with call bell/phone within reach;with chair alarm set  OT Visit Diagnosis: Other abnormalities of gait and mobility (R26.89);Muscle weakness (generalized) (M62.81)                Time: 2130-8657 OT Time Calculation (min): 13 min Charges:  OT General Charges $OT Visit: 1 Visit OT Evaluation $OT Eval Low Complexity: 1 Low  Berenda Breaker., MPH, MS, OTR/L ascom [phone] 09/22/23, 10:40 AM

## 2023-09-22 NOTE — Progress Notes (Signed)
 PROGRESS NOTE    Christine Johns  OZH:[id number] DOB: 10-25-1940 DOA: 09/21/2023 PCP: Monique Ano, MD  [id number]  LOS: 1 day   Brief hospital course:   Assessment & Plan: Christine Johns is a 83 y.o. female with a PMH significant for recent admission with multilobar pneumonia due to metapneumovirus discharged on 4/15, multifocal atrial tachycardia/atrial fibrillation, combined systolic and diastolic CHF, HTN, stage IIIb CKD, HLD, anemia, dementia, who presented from home to the ED on 09/21/2023 with acute onset weakness beginning upon waking up around 10 AM.    Daughter states that since recent discharge for metapneumovirus pneumonia patient has been doing relatively well at home and getting her strength back until the day of presentation.  Noted to have reduced oral intake.  Sepsis, ruled out --no respiratory symptoms to suggest PNA.  CXR finding can be sequale of recent metapneumoviral infection.   --d/c abx   Acute onset weakness --with described language abnormalities.  MRI brain neg for stroke.  Weakness likely due to recent illness, hospitalization and poor oral intake. --PT/OT rec home with [facility] --Ensure adequate oral intake   Multifocal atrial tachycardia's/SVT H/o Atrial fibrillation:  currently rate controlled  --cont Eilquis - Continue home Lopressor  25 mg twice daily   Chronic combined systolic and diastolic CHF exacerbation TTE LVEF 45 to 50%, LV global hypokinesis, grade 2 diastolic dysfunction.  Moderate MR.  Does not appear to be in acute exacerbation. --cont Lopressor     Essential hypertension:  family concerned that her blood pressure was low on arrival.  Appears to be normalized on admission after some IV fluids.  Of note, several of her blood pressure medications were held during her recent admission due to soft blood pressures and those were restarted at discharge.   --cont Lopressor  - Hold home lisinopril , amlodipine     Stage 3b chronic kidney disease:   Creatinine appears to be at baseline of 1.3 on presentation.   HLD:  --resume home statin after discharge   History of normocytic normochromic anemia:  At baseline   Dementia- Per family, oriented to self and able to follow commands as well as ambulatory is her baseline.  She is dependent on ADLs and has an in-home caregiver. - Was discharged with 12.5 mg nightly Seroquel  which daughter said made pt too somnolent --d/c seruqoel -Delirium precautions  Hypokalemia --monitor and supplement PRN   DVT prophylaxis: On:Eliquis  Code Status: Full code  Family Communication: daughter updated at bedside today Level of care: Med-Surg Dispo:   The patient is from: home Anticipated d/c is to: home Anticipated d/c date is: tomorrow   Subjective and Interval History:  Pt reported no respiratory issues.     Objective: Vitals:   09/22/23 0504 09/22/23 0754 09/22/23 0900 09/22/23 1548  BP: (!) 118/53 132/73 131/65 (!) 118/58  Pulse: (!) 49 100 97 (!) 55  Resp: 18 16  16   Temp: 99.3 F (37.4 C) 98.9 F (37.2 C)  98.3 F (36.8 C)  TempSrc:      SpO2: 97% 96%  98%  Weight:      Height:        Intake/Output Summary (Last 24 hours) at 09/22/2023 1705 Last data filed at 09/22/2023 1600 Gross per 24 hour  Intake 170 ml  Output --  Net 170 ml   Filed Weights   09/21/23 2125  Weight: 52.5 kg    Examination:   Constitutional: NAD, alert HEENT: conjunctivae and lids normal, EOMI CV: No cyanosis.   RESP: normal  respiratory effort, on RA Neuro: II - XII grossly intact.     Data Reviewed: I have personally reviewed labs and imaging studies  Time spent: 50 minutes  Garrison Kanner, MD [facility] If 7PM-7AM, please contact night-coverage 09/22/2023, 5:05 PM

## 2023-09-22 NOTE — Evaluation (Signed)
 Physical Therapy Evaluation Patient Details Name: Christine Johns MRN: [id number] DOB: [birth year] Today's Date: 09/22/2023  History of Present Illness  Pt is an 83 y.o. female with a PMH significant for recent admission with multilobar pneumonia due to metapneumovirus discharged on 4/15, multifocal atrial tachycardia/atrial fibrillation, combined systolic and diastolic CHF, HTN, stage IIIb CKD, HLD, anemia, dementia.  MD assessment includes: sepsis, acute onset of weakness, multifocal atrial tachycardia's/SVT, and essential HTN.   Clinical Impression  Pt was pleasant and motivated to participate during the session and put forth good effort throughout. Pt's daughter present at end of session and assisted with PLOF/history.  Pt required minimal assist with bed mobility tasks mostly to initiate movement with her LEs due to some increased confusion at the onset of the session that improved as the session progressed.  Pt required no physical assistance with transfers or gait and was generally steady with no overt LOB.  Pt reported no adverse symptoms during the session with SpO2 and HR WNL throughout on room air.  Pt will benefit from continued PT services upon discharge to safely address deficits listed in patient problem list for decreased caregiver assistance and eventual return to PLOF.           If plan is discharge home, recommend the following: A little help with walking and/or transfers;A little help with bathing/dressing/bathroom;Assistance with cooking/housework;Direct supervision/assist for medications management;Direct supervision/assist for financial management;Assist for transportation;Help with stairs or ramp for entrance;Supervision due to cognitive status   Can travel by private vehicle        Equipment Recommendations Rolling walker (2 wheels);Other (comment) (If does not already own, need to confirm)  Recommendations for Other Services       Functional Status Assessment Patient  has had a recent decline in their functional status and demonstrates the ability to make significant improvements in function in a reasonable and predictable amount of time.     Precautions / Restrictions Precautions Precautions: Fall Recall of Precautions/Restrictions: Impaired Restrictions Weight Bearing Restrictions Per Provider Order: No      Mobility  Bed Mobility Overal bed mobility: Needs Assistance Bed Mobility: Supine to Sit     Supine to sit: Min assist     General bed mobility comments: Min A with bed mobility tasks likely secondary to cognitive deficits    Transfers Overall transfer level: Needs assistance Equipment used: Rolling walker (2 wheels) Transfers: Sit to/from Stand and SPT Sit to Stand: Contact guard assist with the RW for sit to stand and +1 HHA with SPT           General transfer comment: Verbal and tactile cues for hand placement    Ambulation/Gait Ambulation/Gait assistance: Contact guard assist Gait Distance (Feet): 30 Feet Assistive device: Rolling walker (2 wheels) Gait Pattern/deviations: Step-through pattern, Decreased step length - left, Decreased step length - right Gait velocity: decreased     General Gait Details: Slow cadence but steady with gait with no overt LOB  Stairs            Wheelchair Mobility     Tilt Bed    Modified Rankin (Stroke Patients Only)       Balance Overall balance assessment: Needs assistance Sitting-balance support: No upper extremity supported, Feet supported Sitting balance-Leahy Scale: Good     Standing balance support: Single extremity supported, Bilateral upper extremity supported, During functional activity, Reliant on assistive device for balance Standing balance-Leahy Scale: Fair Standing balance comment: Fair with +1 HHA, good with RW  Pertinent Vitals/Pain Pain Assessment Pain Assessment: No/denies pain    Home Living  Family/patient expects to be discharged to:: Private residence Living Arrangements: Spouse/significant other Available Help at Discharge: Family;Available 24 hours/day;Personal care attendant Type of Home: House Home Access: Stairs to enter Entrance Stairs-Rails: Right Entrance Stairs-Number of Steps: 3   Home Layout: One level   Additional Comments: Pt reports having a shower chair and rollator, no family there to verify    Prior Function Prior Level of Function : History of Falls (last six months)  Cognitive Assist : ADLs (cognitive)           Mobility Comments: Ind amb community distances without an AD ADLs Comments: Has a PCA 3-4x/wk for 4hrs/day that assists with ADLs     Extremity/Trunk Assessment   Upper Extremity Assessment Upper Extremity Assessment: Generalized weakness    Lower Extremity Assessment Lower Extremity Assessment: Generalized weakness       Communication   Communication Communication: No apparent difficulties    Cognition Arousal: Alert Behavior During Therapy: WFL for tasks assessed/performed   PT - Cognitive impairments: History of cognitive impairments                         Following commands: Intact Following commands impaired: Only follows one step commands consistently, Follows one step commands with increased time     Cueing Cueing Techniques: Verbal cues     General Comments      Exercises     Assessment/Plan    PT Assessment Patient needs continued PT services  PT Problem List Decreased strength;Decreased activity tolerance;Decreased balance;Decreased mobility;Decreased cognition;Decreased knowledge of use of DME;Decreased safety awareness;Decreased knowledge of precautions;Cardiopulmonary status limiting activity       PT Treatment Interventions DME instruction;Gait training;Functional mobility training;Therapeutic activities;Therapeutic exercise;Balance training;Patient/family education;Stair training     PT Goals (Current goals can be found in the Care Plan section)  Acute Rehab PT Goals Patient Stated Goal: To get stronger PT Goal Formulation: With family Time For Goal Achievement: 10/05/23 Potential to Achieve Goals: Good    Frequency Min 2X/week     Co-evaluation               AM-PAC PT "6 Clicks" Mobility  Outcome Measure Help needed turning from your back to your side while in a flat bed without using bedrails?: A Little Help needed moving from lying on your back to sitting on the side of a flat bed without using bedrails?: A Little Help needed moving to and from a bed to a chair (including a wheelchair)?: A Little Help needed standing up from a chair using your arms (e.g., wheelchair or bedside chair)?: A Little Help needed to walk in hospital room?: A Little Help needed climbing 3-5 steps with a railing? : A Little 6 Click Score: 18    End of Session Equipment Utilized During Treatment: Gait belt Activity Tolerance: Patient tolerated treatment well Patient left: with family/visitor present;with nursing/sitter in room (Pt left with nursing on [facility] for toileting) Nurse Communication: Mobility status PT Visit Diagnosis: History of falling (Z91.81);Muscle weakness (generalized) (M62.81);Difficulty in walking, not elsewhere classified (R26.2)    Time: 0902-0929 PT Time Calculation (min) (ACUTE ONLY): 27 min   Charges:   PT Evaluation $PT Eval Moderate Complexity: 1 Mod   PT General Charges $$ ACUTE PT VISIT: 1 Visit    D. Scott Adi Doro PT, DPT 09/22/23, 11:32 AM

## 2023-09-23 DIAGNOSIS — R531 Weakness: Secondary | ICD-10-CM | POA: Diagnosis not present

## 2023-09-23 LAB — BASIC METABOLIC PANEL WITH GFR
Anion gap: 7 (ref 5–15)
BUN: 16 mg/dL (ref 8–23)
CO2: 19 mmol/L — ABNORMAL LOW (ref 22–32)
Calcium: 8.1 mg/dL — ABNORMAL LOW (ref 8.9–10.3)
Chloride: 106 mmol/L (ref 98–111)
Creatinine, Ser: 1.11 mg/dL — ABNORMAL HIGH (ref 0.44–1.00)
GFR, Estimated: 49 mL/min — ABNORMAL LOW (ref >60)
Glucose, Bld: 110 mg/dL — ABNORMAL HIGH (ref 70–99)
Potassium: 3.6 mmol/L (ref 3.5–5.1)
Sodium: 136 mmol/L (ref 135–145)

## 2023-09-23 LAB — CBC
HCT: 24.8 % — ABNORMAL LOW (ref 36.0–46.0)
Hemoglobin: 8.5 g/dL — ABNORMAL LOW (ref 12.0–15.0)
MCH: 29.9 pg (ref 26.0–34.0)
MCHC: 34.3 g/dL (ref 30.0–36.0)
MCV: 87.3 fL (ref 80.0–100.0)
Platelets: 179 10*3/uL (ref 150–400)
RBC: 2.84 MIL/uL — ABNORMAL LOW (ref 3.87–5.11)
RDW: 14.8 % (ref 11.5–15.5)
WBC: 9.1 10*3/uL (ref 4.0–10.5)
nRBC: 0 % (ref 0.0–0.2)

## 2023-09-23 LAB — LACTIC ACID, PLASMA: Lactic Acid, Venous: 1 mmol/L (ref 0.5–1.9)

## 2023-09-23 LAB — MAGNESIUM: Magnesium: 2 mg/dL (ref 1.7–2.4)

## 2023-09-23 MED ORDER — ENSURE ENLIVE PO LIQD: 237.0000 mL | Freq: Three times a day (TID) | ORAL | Status: AC

## 2023-09-23 NOTE — Discharge Summary (Signed)
 Physician Discharge Summary   Christine Johns  female DOB: [birth year]  UEA:[id number]  PCP: Monique Ano, MD  Admit date: 09/21/2023 Discharge date: 09/23/2023  Admitted From: home Disposition:  home Daughter updated at bedside prior to discharge. Home Health: Yes CODE STATUS: Full code  Discharge Instructions     Diet general   Complete by: As directed       Hospital Course:  For full details, please see H&P, progress notes, consult notes and ancillary notes.  Briefly,  Christine Johns is a 83 y.o. female with a PMH significant for recent admission with multilobar pneumonia due to metapneumovirus discharged on 4/15, multifocal atrial tachycardia/atrial fibrillation, combined systolic and diastolic CHF, HTN, stage IIIb CKD, dementia, who presented from home to the ED on 09/21/2023 with acute onset weakness beginning upon waking up around 10 AM.     Daughter states that since recent discharge for metapneumovirus pneumonia patient has been doing relatively well at home and getting her strength back until the day of presentation.  Noted to have reduced oral intake.   Sepsis, ruled out --no respiratory symptoms to suggest PNA.  CXR finding can be sequale of recent metapneumoviral infection.  Pt also received 5 days of ceftriaxone  and azithromycin  during recent hospitalization about 2 weeks ago, so unlikely to have another bacterial PNA developing. --d/c'ed abx   Acute onset weakness --daughter described language abnormalities.  MRI brain neg for stroke.  Weakness likely due to recent illness, hospitalization and poor oral intake. --PT/OT rec home with [facility] --Ensure adequate oral intake with supplements  Dry cough --likely still from recent metapneumoviral infection.  Multifocal atrial tachycardia's/SVT H/o Atrial fibrillation:  currently rate controlled  --cont Eilquis - Continue home Lopressor  25 mg twice daily   Chronic combined systolic and diastolic CHF  exacerbation TTE LVEF 45 to 50%, LV global hypokinesis, grade 2 diastolic dysfunction.  Moderate MR.  Does not appear to be in acute exacerbation. --cont Lopressor     Essential hypertension:  family concerned that her blood pressure was low on arrival.  Appears to be normalized on admission after some IV fluids.  Of note, several of her blood pressure medications were held during her recent admission due to soft blood pressures and those were restarted at discharge.   --cont Lopressor    Stage 3b chronic kidney disease:  Creatinine appears to be at baseline of 1.3 on presentation.   HLD:  --resume home statin after discharge   History of normocytic normochromic anemia:  At baseline   Dementia- Per family, oriented to self and able to follow commands as well as ambulatory is her baseline.  She is dependent on ADLs and has an in-home caregiver. - Was discharged with 12.5 mg nightly Seroquel  which daughter said made pt too somnolent --d/c'ed seruqoel   Hypokalemia --monitored and supplemented PRN   Discharge Diagnoses:  Principal Problem:   Weakness   30 Day Unplanned Readmission Risk Score    Flowsheet Row ED to Hosp-Admission (Current) from 09/21/2023 in [facility] (1A)  30 Day Unplanned Readmission Risk Score (%) 20.4 Filed at 09/23/2023 0801       This score is the patient's risk of an unplanned readmission within 30 days of being discharged (0 -100%). The score is based on dignosis, age, lab data, medications, orders, and past utilization.   Low:  0-14.9   Medium: 15-21.9   High: 22-29.9   Extreme: 30 and above  Discharge Instructions:  Allergies as of 09/23/2023       Reactions   Atorvastatin Other (See Comments)   Penicillins Itching, Swelling   Sulfa Antibiotics Itching, Swelling        Medication List     STOP taking these medications    polyethylene glycol powder 17 GM/SCOOP powder Commonly known as:  GLYCOLAX /MIRALAX    QUEtiapine  25 MG tablet Commonly known as: SEROQUEL        TAKE these medications    apixaban  2.5 MG Tabs tablet Commonly known as: ELIQUIS  Take 1 tablet (2.5 mg total) by mouth 2 (two) times daily.   Calcium  Carb-Cholecalciferol  600-10 MG-MCG Tabs Take 2 tablets by mouth daily.   feeding supplement Liqd Take 237 mLs by mouth 3 (three) times daily between meals.   lovastatin 40 MG tablet Commonly known as: MEVACOR Take 40 mg by mouth daily.   metoprolol  tartrate 25 MG tablet Commonly known as: LOPRESSOR  Take 1 tablet (25 mg total) by mouth 2 (two) times daily.   Multi-Vitamin tablet Take 1 tablet by mouth daily.   pantoprazole  40 MG tablet Commonly known as: PROTONIX  Take 1 tablet (40 mg total) by mouth daily for 7 days.   Robafen DM 20-200 MG/20ML Liqd Generic drug: Dextromethorphan -guaiFENesin  Take 20 mLs by mouth every 4 (four) hours as needed.   Vitamin D  125 MCG (5000 UT) Caps Take 1 capsule by mouth daily.         Follow-up Information     Monique Ano, MD Follow up in 1 week(s).   Specialty: Family Medicine Contact information: [address] [facility] [place] [address] [phone]                 Allergies  Allergen Reactions   Atorvastatin Other (See Comments)   Penicillins Itching and Swelling   Sulfa Antibiotics Itching and Swelling     The results of significant diagnostics from this hospitalization (including imaging, microbiology, ancillary and laboratory) are listed below for reference.   Consultations:   Procedures/Studies: MR BRAIN W WO CONTRAST Result Date: 09/21/2023 CLINICAL DATA:  Neuro deficit, acute, stroke suspected EXAM: MRI HEAD WITHOUT AND WITH CONTRAST TECHNIQUE: Multiplanar, multiecho pulse sequences of the brain and surrounding structures were obtained without and with intravenous contrast. CONTRAST:  5mL GADAVIST  GADOBUTROL  1 MMOL/ML IV SOLN COMPARISON:  CT  head from today. FINDINGS: Brain: No evidence of acute infarct, acute hemorrhage, mass lesion or midline shift. Remote bilateral cerebellar infarcts. Remote bilateral basal ganglia lacunar infarcts. Remote left occipital infarct. Additional moderate T2/FLAIR hyperintensities the white matter, compatible with chronic microvascular ischemic disease. Cerebral atrophy. No pathologic enhancement. Vascular: Major arterial flow voids are maintained at the skull base. Skull and upper cervical spine: Normal marrow signal. Sinuses/Orbits: Moderate paranasal sinus mucosal thickening. No acute orbital findings. Other: Small mastoid effusions. IMPRESSION: 1. No evidence of acute intracranial abnormality. 2. Remote infarcts and chronic microvascular ischemic disease. Electronically Signed   By: Stevenson Elbe M.D.   On: 09/21/2023 22:10   CT Head Wo Contrast Result Date: 09/21/2023 CLINICAL DATA:  Headache, new onset (Age >= 51y) EXAM: CT HEAD WITHOUT CONTRAST TECHNIQUE: Contiguous axial images were obtained from the base of the skull through the vertex without intravenous contrast. RADIATION DOSE REDUCTION: This exam was performed according to the departmental dose-optimization program which includes automated exposure control, adjustment of the mA and/or kV according to patient size and/or use of iterative reconstruction technique. COMPARISON:  09/07/2023. FINDINGS: Brain: There is periventricular white matter  decreased attenuation consistent with small vessel ischemic changes. Ventricles, sulci and cisterns are prominent consistent with age related involutional changes. No acute intracranial hemorrhage, mass effect or shift. No hydrocephalus. Encephalomalacia consistent with a numerous bilateral cerebellar and basal ganglia chronic lacunar CVAs. Vascular: No hyperdense vessel or unexpected calcification. Skull: Normal. Negative for fracture or focal lesion. Sinuses/Orbits: Mucoperiosteal thickening consistent with  chronic ethmoid sinusitis. Left maxillary antrum air-fluid level consistent with acute sinusitis. IMPRESSION: 1. Atrophy and chronic small vessel ischemic changes. Multiple chronic lacunar CVAs. 2. 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 Electronically Signed   By: Sydell Eva M.D.   On: 09/21/2023 18:41   DG Chest 2 View Result Date: 09/21/2023 CLINICAL DATA:  Fever, altered mental status. EXAM: CHEST - 2 VIEW COMPARISON:  09/16/2023. FINDINGS: The heart is enlarged and mediastinal contours are within normal limits. There is atherosclerotic calcification of the aorta. Peribronchial cuffing is noted bilaterally and there is mild airspace disease at the lung bases, increased from the prior exam. No effusion or pneumothorax is seen. A compression deformity is noted at T12 with loss of vertebral body height of 50% anteriorly. IMPRESSION: 1. Peribronchial cuffing bilaterally with patchy airspace disease at the lung bases, increased from the prior exam and concerning for pneumonia. 2. Wedge-shaped compression deformity at T12, indeterminate in age. Electronically Signed   By: Wyvonnia Heimlich M.D.   On: 09/21/2023 16:42   DG Chest Port 1 View Result Date: 09/16/2023 CLINICAL DATA:  Pleural effusion. Status post right-sided thoracentesis. EXAM: PORTABLE CHEST 1 VIEW COMPARISON:  Chest radiograph dated 09/15/2023. FINDINGS: Improved aeration at the right lung base with decreased small right pleural effusion status post thoracentesis. No definite pneumothorax. Stable cardiomegaly with pulmonary vascular congestion and small bilateral pleural effusions. Visualized osseous structures are unchanged. IMPRESSION: 1. Improved aeration at the right lung base status post thoracentesis. No definite pneumothorax. 2. Cardiomegaly with pulmonary vascular congestion and small bilateral pleural effusions. Electronically Signed   By: Mannie Seek M.D.   On: 09/16/2023 13:16   US  THORACENTESIS ASP PLEURAL SPACE W/IMG GUIDE Result Date:  09/16/2023 INDICATION: Patient with congestive heart failure and symptomatic bilateral pleural effusions, request received for right-sided thoracentesis, patient had left-sided thoracentesis yesterday. EXAM: ULTRASOUND GUIDED RIGHT THORACENTESIS MEDICATIONS: Local 1% lidocaine  only. COMPLICATIONS: None immediate. PROCEDURE: An ultrasound guided thoracentesis was thoroughly discussed with the patient and questions answered. The benefits, risks, alternatives and complications were also discussed. The patient understands and wishes to proceed with the procedure. Written consent was obtained. Ultrasound was performed to localize and mark an adequate pocket of fluid in the right chest. The area was then prepped and draped in the normal sterile fashion. 1% Lidocaine  was used for local anesthesia. Under ultrasound guidance a 19 gauge, 7-cm, Yueh catheter was introduced. Thoracentesis was performed. The catheter was removed and a dressing applied. FINDINGS: A total of approximately 400 mL of clear yellow fluid was removed. Samples were sent to the laboratory as requested by the clinical team. IMPRESSION: Successful ultrasound guided right thoracentesis yielding 400 mL of pleural fluid. This exam was performed by Sherline Distel PA-C, and was supervised and interpreted by Dr. Nereida Banning. Electronically Signed   By: Erica Hau M.D.   On: 09/16/2023 13:11   DG Chest Port 1 View Result Date: 09/15/2023 CLINICAL DATA:  Pleural effusion.  Post thoracentesis on the left. EXAM: PORTABLE CHEST 1 VIEW COMPARISON:  Radiographs 09/14/2023 and 09/12/2023. FINDINGS: 1127 hours. No significant change in cardiomegaly, vascular congestion and bilateral pleural effusions. No evidence  of pneumothorax. The bones appear unchanged. Telemetry leads overlie the chest. IMPRESSION: No evidence of pneumothorax following thoracentesis. Persistent cardiomegaly, vascular congestion and bilateral pleural effusions. Electronically Signed   By: Elmon Hagedorn M.D.   On: 09/15/2023 13:22   US  THORACENTESIS ASP PLEURAL SPACE W/IMG GUIDE Result Date: 09/15/2023 INDICATION: Patient with congestive heart failure and symptomatic bilateral pleural effusions, request received for diagnostic therapeutic thoracentesis. EXAM: ULTRASOUND GUIDED LEFT THORACENTESIS MEDICATIONS: Local 1% lidocaine  only. COMPLICATIONS: None immediate. PROCEDURE: An ultrasound guided thoracentesis was thoroughly discussed with the patient and questions answered. The benefits, risks, alternatives and complications were also discussed. The patient understands and wishes to proceed with the procedure. Written consent was obtained. Ultrasound was performed to localize and mark an adequate pocket of fluid in the left chest. The area was then prepped and draped in the normal sterile fashion. 1% Lidocaine  was used for local anesthesia. Under ultrasound guidance a 19 gauge, 7-cm, Yueh catheter was introduced. Thoracentesis was performed. The catheter was removed and a dressing applied. FINDINGS: A total of approximately 400 mL of clear yellow fluid was removed. Samples were sent to the laboratory as requested by the clinical team. IMPRESSION: Successful ultrasound guided left thoracentesis yielding 400 mL of pleural fluid. This exam was performed by Sherline Distel PA-C, and was supervised and interpreted by Dr. Marne Sings. Electronically Signed   By: Fernando Hoyer M.D.   On: 09/15/2023 12:59   DG Chest Port 1 View Result Date: 09/14/2023 CLINICAL DATA:  Follow-up pleural effusion EXAM: PORTABLE CHEST 1 VIEW COMPARISON:  09/12/2023 FINDINGS: Cardiac shadow is mildly prominent but accentuated by the portable technique. Aortic calcifications are seen. Vascular congestion is again identified with small effusions bilaterally consistent with CHF. No new focal abnormality is noted. IMPRESSION: Changes of CHF stable from the prior study. Electronically Signed   By: Violeta Grey M.D.   On: 09/14/2023  19:05   DG Chest Port 1 View Result Date: 09/12/2023 CLINICAL DATA:  [id number] Pulmonary edema [id number]. EXAM: PORTABLE CHEST 1 VIEW COMPARISON:  09/08/2023. FINDINGS: There is mild-to-moderate diffuse pulmonary vascular congestion with bilateral hilar and bibasilar predominance. There are bilateral small-to-moderate layering pleural effusions. No pneumothorax. Mildly enlarged cardio-mediastinal silhouette, which is likely accentuated by low lung volume and AP technique. No acute osseous abnormalities. The soft tissues are within normal limits. IMPRESSION: *Findings favor congestive heart failure/pulmonary edema. Electronically Signed   By: Beula Brunswick M.D.   On: 09/12/2023 14:35   ECHOCARDIOGRAM COMPLETE Result Date: 09/10/2023    ECHOCARDIOGRAM REPORT   Patient Name:   Christine Johns Date of Exam: 09/09/2023 Medical Rec #:  [id number]     Height:       63.0 in Accession #:    [id number]    Weight:       118.2 lb Date of Birth:  07-20-1940     BSA:          1.546 m Patient Age:    83 years      BP:           91/44 mmHg Patient Gender: F             HR:           78 bpm. Exam Location:  [facility] Procedure: 2D Echo, Cardiac Doppler and Color Doppler (Both Spectral and Color            Flow Doppler were utilized during procedure). Indications:     I48.91 Atrial Fibrillation  History:  Patient has prior history of Echocardiogram examinations, most                  recent 05/02/2023. Arrythmias:Paroxysmal Atrial Fibrillation;                  Risk Factors:Hypertension and Dyslipidemia.  Sonographer:     Brigid Canada RDCS Referring Phys:  [id number] Althia Atlas Diagnosing Phys: Sammy Crisp MD IMPRESSIONS  1. Left ventricular ejection fraction, by estimation, is 45 to 50%. The left ventricle has mildly decreased function. The left ventricle demonstrates global hypokinesis. There is mild asymmetric left ventricular hypertrophy of the basal-septal segment. Left ventricular diastolic parameters are consistent  with Grade II diastolic dysfunction (pseudonormalization). Elevated left atrial pressure.  2. Right ventricular systolic function is normal. The right ventricular size is normal.  3. Left atrial size was mildly dilated.  4. The mitral valve is degenerative. Moderate mitral valve regurgitation. No evidence of mitral stenosis.  5. The aortic valve is tricuspid. There is mild thickening of the aortic valve. Aortic valve regurgitation is not visualized. Aortic valve sclerosis is present, with no evidence of aortic valve stenosis.  6. The inferior vena cava is dilated in size with <50% respiratory variability, suggesting right atrial pressure of 15 mmHg. FINDINGS  Left Ventricle: Left ventricular ejection fraction, by estimation, is 45 to 50%. The left ventricle has mildly decreased function. The left ventricle demonstrates global hypokinesis. The left ventricular internal cavity size was normal in size. There is  mild asymmetric left ventricular hypertrophy of the basal-septal segment. Left ventricular diastolic parameters are consistent with Grade II diastolic dysfunction (pseudonormalization). Elevated left atrial pressure. Right Ventricle: The right ventricular size is normal. No increase in right ventricular wall thickness. Right ventricular systolic function is normal. Left Atrium: Left atrial size was mildly dilated. Right Atrium: Right atrial size was normal in size. Pericardium: Trivial pericardial effusion is present. Mitral Valve: The mitral valve is degenerative in appearance. There is mild thickening of the mitral valve leaflet(s). Moderate mitral valve regurgitation. No evidence of mitral valve stenosis. Tricuspid Valve: The tricuspid valve is normal in structure. Tricuspid valve regurgitation is trivial. Aortic Valve: The aortic valve is tricuspid. There is mild thickening of the aortic valve. Aortic valve regurgitation is not visualized. Aortic valve sclerosis is present, with no evidence of aortic valve  stenosis. Aortic valve mean gradient measures 5.5  mmHg. Aortic valve peak gradient measures 8.6 mmHg. Aortic valve area, by VTI measures 1.02 cm. Pulmonic Valve: The pulmonic valve was normal in structure. Pulmonic valve regurgitation is mild. No evidence of pulmonic stenosis. Aorta: The aortic root and ascending aorta are structurally normal, with no evidence of dilitation. Pulmonary Artery: The pulmonary artery is of normal size. Venous: The inferior vena cava is dilated in size with less than 50% respiratory variability, suggesting right atrial pressure of 15 mmHg. IAS/Shunts: The interatrial septum was not well visualized.  LEFT VENTRICLE PLAX 2D LVIDd:         5.00 cm     Diastology LVIDs:         3.20 cm     LV e' medial:    5.66 cm/s LV PW:         0.80 cm     LV E/e' medial:  18.6 LV IVS:        1.12 cm     LV e' lateral:   6.74 cm/s LVOT diam:     1.80 cm     LV E/e' lateral:  15.6 LV SV:         36 LV SV Index:   23 LVOT Area:     2.54 cm  LV Volumes (MOD) LV vol d, MOD A2C: 61.2 ml LV vol d, MOD A4C: 66.2 ml LV vol s, MOD A2C: 37.9 ml LV vol s, MOD A4C: 35.9 ml LV SV MOD A2C:     23.3 ml LV SV MOD A4C:     66.2 ml LV SV MOD BP:      27.5 ml RIGHT VENTRICLE             IVC RV Basal diam:  3.70 cm     IVC diam: 2.10 cm RV S prime:     11.60 cm/s TAPSE (M-mode): 2.4 cm LEFT ATRIUM             Index        RIGHT ATRIUM          Index LA diam:        3.40 cm 2.20 cm/m   RA Area:     9.82 cm LA Vol (A2C):   63.4 ml 41.01 ml/m  RA Volume:   22.30 ml 14.42 ml/m LA Vol (A4C):   46.1 ml 29.82 ml/m LA Biplane Vol: 54.1 ml 34.99 ml/m  AORTIC VALVE AV Area (Vmax):    1.22 cm AV Area (Vmean):   1.11 cm AV Area (VTI):     1.02 cm AV Vmax:           146.30 cm/s AV Vmean:          113.896 cm/s AV VTI:            0.353 m AV Peak Grad:      8.6 mmHg AV Mean Grad:      5.5 mmHg LVOT Vmax:         70.20 cm/s LVOT Vmean:        49.533 cm/s LVOT VTI:          0.141 m LVOT/AV VTI ratio: 0.40  AORTA Ao Root diam: 3.20  cm Ao Asc diam:  3.40 cm MITRAL VALVE MV Area (PHT): 3.32 cm     SHUNTS MV Decel Time: 229 msec     Systemic VTI:  0.14 m MV E velocity: 105.00 cm/s  Systemic Diam: 1.80 cm MV A velocity: 95.90 cm/s MV E/A ratio:  1.09 Veryl Gottron End MD Electronically signed by Sammy Crisp MD Signature Date/Time: 09/10/2023/8:55:54 AM    Final    DG Chest Port 1 View Result Date: 09/08/2023 CLINICAL DATA:  Aspiration into airway. EXAM: PORTABLE CHEST 1 VIEW COMPARISON:  Chest radiograph dated 09/07/2023. FINDINGS: Patient is rotated to the right. The heart size and mediastinal contours are unchanged. Increasing left basilar opacity. Right lung is clear. No sizable pleural effusion or pneumothorax. No acute osseous abnormality. IMPRESSION: Increasing left basilar opacity, which could reflect aspiration or worsening pneumonia. Electronically Signed   By: Mannie Seek M.D.   On: 09/08/2023 15:28   CT Cervical Spine Wo Contrast Result Date: 09/07/2023 CLINICAL DATA:  Neck trauma.  Altered mental status.  Abnormal gait. EXAM: CT CERVICAL SPINE WITHOUT CONTRAST TECHNIQUE: Multidetector CT imaging of the cervical spine was performed without intravenous contrast. Multiplanar CT image reconstructions were also generated. RADIATION DOSE REDUCTION: This exam was performed according to the departmental dose-optimization program which includes automated exposure control, adjustment of the mA and/or kV according to patient size and/or use of iterative reconstruction technique. COMPARISON:  None  Available. FINDINGS: Alignment: Slight degenerative anterolisthesis is present at C3-4 and C4-5. Reversal of the normal cervical lordosis is present. Skull base and vertebrae: The craniocervical junction is normal. The vertebral body heights are normal. No acute fractures are present. Prominent pannus formation is present at C1-2. Soft tissues and spinal canal: No prevertebral fluid or swelling. No visible canal hematoma. Disc levels:  Uncovertebral spurring is worse left than right at C5-6 and C6-7 with left foraminal stenosis at both of these levels Upper chest: A left pleural effusion is present. Mild dependent atelectasis is present bilaterally. IMPRESSION: 1. No acute fracture or traumatic subluxation. 2. Degenerative changes of the cervical spine as described. 3. Left pleural effusion. Electronically Signed   By: Audree Leas M.D.   On: 09/07/2023 13:18   CT HEAD WO CONTRAST ( ) Result Date: 09/07/2023 CLINICAL DATA:  Head trauma, minor. Altered mental status. Weakness. Possible urinary tract infection. EXAM: CT HEAD WITHOUT CONTRAST TECHNIQUE: Contiguous axial images were obtained from the base of the skull through the vertex without intravenous contrast. RADIATION DOSE REDUCTION: This exam was performed according to the departmental dose-optimization program which includes automated exposure control, adjustment of the mA and/or kV according to patient size and/or use of iterative reconstruction technique. COMPARISON:  CT head without contrast 05/02/2023. FINDINGS: Brain: Advanced atrophy and white matter disease is similar the prior study. No acute or focal cortical abnormality is present. No acute hemorrhage or mass lesion is present. A remote lacunar infarct of the left basal ganglia is stable. Deep gray nuclei are otherwise within normal limits. The brainstem and cerebellum are within normal limits. Midline structures are within normal limits. Vascular: Atherosclerotic calcifications are present within the cavernous internal carotid arteries bilaterally. No hyperdense vessel is present. Skull: Calvarium is intact. No focal lytic or blastic lesions are present. No significant extracranial soft tissue lesion is present. Sinuses/Orbits: The paranasal sinuses and mastoid air cells are clear. Bilateral lens replacements are noted. Globes and orbits are otherwise unremarkable. IMPRESSION: 1. No acute intracranial abnormality or  significant interval change. 2. Advanced atrophy and white matter disease is similar the prior study and likely reflects the sequela of chronic microvascular ischemia. 3. 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 Electronically Signed   By: Audree Leas M.D.   On: 09/07/2023 13:15   DG Chest Port 1 View Result Date: 09/07/2023 CLINICAL DATA:  Altered mental status, weakness.  Suspected sepsis. EXAM: PORTABLE CHEST 1 VIEW COMPARISON:  03/27/2020 FINDINGS: Heart mediastinal contours within normal limits. Patchy left lower lobe airspace opacity could reflect early pneumonia. No confluent opacity on the right. No effusions or acute bony abnormality. IMPRESSION: Patchy left lower lobe opacity, question early pneumonia. Electronically Signed   By: Janeece Mechanic M.D.   On: 09/07/2023 12:12      Labs: BNP (last 3 results) No results for input(s): "BNP" in the last 8760 hours. Basic Metabolic Panel: Recent Labs  Lab 09/21/23 1516 09/22/23 0402 09/23/23 0453  NA 136 139 136  K 3.7 3.1* 3.6  CL 106 108 106  CO2 20* 22 19*  GLUCOSE 150* 106* 110*  BUN 13 15 16   CREATININE 1.31* 1.19* 1.11*  CALCIUM  8.8* 8.4* 8.1*  MG 1.4* 1.8 2.0   Liver Function Tests: Recent Labs  Lab 09/21/23 1516  AST 31  ALT 24  ALKPHOS 51  BILITOT 1.0  PROT 6.9  ALBUMIN 3.1*   No results for input(s): "LIPASE", "AMYLASE" in the last 168 hours. No results for input(s): "  AMMONIA" in the last 168 hours. CBC: Recent Labs  Lab 09/21/23 1516 09/22/23 0402 09/23/23 0453  WBC 12.6* 9.5 9.1  NEUTROABS 10.4*  --   --   HGB 11.0* 8.8* 8.5*  HCT 33.4* 25.6* 24.8*  MCV 89.3 89.8 87.3  PLT 241 192 179   Cardiac Enzymes: No results for input(s): "CKTOTAL", "CKMB", "CKMBINDEX", "TROPONINI" in the last 168 hours. BNP: Invalid input(s): "POCBNP" CBG: No results for input(s): "GLUCAP" in the last 168 hours. D-Dimer No results for input(s): "DDIMER" in the last 72 hours. Hgb A1c No results for  input(s): "HGBA1C" in the last 72 hours. Lipid Profile No results for input(s): "CHOL", "HDL", "LDLCALC", "TRIG", "CHOLHDL", "LDLDIRECT" in the last 72 hours. Thyroid function studies No results for input(s): "TSH", "T4TOTAL", "T3FREE", "THYROIDAB" in the last 72 hours.  Invalid input(s): "FREET3" Anemia work up No results for input(s): "VITAMINB12", "FOLATE", "FERRITIN", "TIBC", "IRON", "RETICCTPCT" in the last 72 hours. Urinalysis    Component Value Date/Time   COLORURINE AMBER (A) 09/21/2023 1712   APPEARANCEUR CLOUDY (A) 09/21/2023 1712   LABSPEC 1.010 09/21/2023 1712   PHURINE 6.0 09/21/2023 1712   GLUCOSEU NEGATIVE 09/21/2023 1712   HGBUR NEGATIVE 09/21/2023 1712   BILIRUBINUR NEGATIVE 09/21/2023 1712   KETONESUR NEGATIVE 09/21/2023 1712   PROTEINUR NEGATIVE 09/21/2023 1712   NITRITE NEGATIVE 09/21/2023 1712   LEUKOCYTESUR NEGATIVE 09/21/2023 1712   Sepsis Labs Recent Labs  Lab 09/21/23 1516 09/22/23 0402 09/23/23 0453  WBC 12.6* 9.5 9.1   Microbiology Recent Results (from the past 240 hours)  Body fluid culture w Gram Stain     Status: None   Collection Time: 09/15/23 10:56 AM   Specimen: PATH Cytology Pleural fluid  Result Value Ref Range Status   Specimen Description   Final    PLEURAL Performed at [facility], [address] [place], [place] [address]    Special Requests   Final    NONE Performed at [facility], [address] [place], [place] [address]    Gram Stain   Final    WBC PRESENT, PREDOMINANTLY PMN NO ORGANISMS SEEN CYTOSPIN SMEAR    Culture   Final    NO GROWTH 3 DAYS Performed at [facility], [address], [place], [place] [address]    Report Status 09/19/2023 FINAL  Final  Body fluid culture w Gram Stain     Status: None   Collection Time: 09/16/23 10:31 AM   Specimen: PATH Cytology Pleural fluid  Result Value Ref Range Status   Specimen Description   Final    PLEURAL Performed at [facility], [address] [place], [place] [address]    Special Requests   Final    NONE Performed at [facility], [address] [place], [place] [address]    Gram Stain   Final    WBC PRESENT, PREDOMINANTLY PMN NO ORGANISMS SEEN CYTOSPIN SMEAR    Culture   Final    NO GROWTH 3 DAYS Performed at [facility], [address] [place], [place] [address]    Report Status 09/19/2023 FINAL  Final  Culture, blood (routine x 2)     Status: None (Preliminary result)   Collection Time: 09/21/23  4:03 PM   Specimen: BLOOD  Result Value Ref Range Status   Specimen Description BLOOD BLOOD RIGHT ARM  Final   Special Requests   Final    BOTTLES DRAWN AEROBIC AND ANAEROBIC Blood Culture results may  not be optimal due to an inadequate volume of blood received in culture bottles   Culture   Final    NO GROWTH 2 DAYS Performed at [facility], [address] [place], [place] [address]    Report Status PENDING  Incomplete  Resp panel by RT-PCR (RSV, Flu A&B, Covid) Anterior Nasal Swab     Status: None   Collection Time: 09/21/23  4:03 PM   Specimen: Anterior Nasal Swab  Result Value Ref Range Status   SARS Coronavirus 2 by RT PCR NEGATIVE NEGATIVE Final    Comment: (NOTE) SARS-CoV-2 target nucleic acids are NOT DETECTED.  The SARS-CoV-2 RNA is generally detectable in upper respiratory specimens during the acute phase of infection. The lowest concentration of SARS-CoV-2 viral copies this assay can detect is 138 copies/mL. A negative result does not preclude SARS-Cov-2 infection and should not be used as the sole basis for treatment or other patient management decisions. A negative result may occur with  improper specimen collection/handling, submission of specimen other than nasopharyngeal swab, presence of viral mutation(s) within the areas targeted by this assay, and inadequate number of viral copies(<138 copies/mL). A negative result must be combined  with clinical observations, patient history, and epidemiological information. The expected result is Negative.  Fact Sheet for Patients:  BloggerCourse.com  Fact Sheet for Healthcare Providers:  SeriousBroker.it  This test is no t yet approved or cleared by the [place]  FDA and  has been authorized for detection and/or diagnosis of SARS-CoV-2 by FDA under an Emergency Use Authorization (EUA). This EUA will remain  in effect (meaning this test can be used) for the duration of the COVID-19 declaration under Section 564(b)(1) of the Act, 21 U.S.C.section 360bbb-3(b)(1), unless the authorization is terminated  or revoked sooner.       Influenza A by PCR NEGATIVE NEGATIVE Final   Influenza B by PCR NEGATIVE NEGATIVE Final    Comment: (NOTE) The Xpert Xpress SARS-CoV-2/FLU/RSV plus assay is intended as an aid in the diagnosis of influenza from Nasopharyngeal swab specimens and should not be used as a sole basis for treatment. Nasal washings and aspirates are unacceptable for Xpert Xpress SARS-CoV-2/FLU/RSV testing.  Fact Sheet for Patients: BloggerCourse.com  Fact Sheet for Healthcare Providers: SeriousBroker.it  This test is not yet approved or cleared by the [place]  FDA and has been authorized for detection and/or diagnosis of SARS-CoV-2 by FDA under an Emergency Use Authorization (EUA). This EUA will remain in effect (meaning this test can be used) for the duration of the COVID-19 declaration under Section 564(b)(1) of the Act, 21 U.S.C. section 360bbb-3(b)(1), unless the authorization is terminated or revoked.     Resp Syncytial Virus by PCR NEGATIVE NEGATIVE Final    Comment: (NOTE) Fact Sheet for Patients: BloggerCourse.com  Fact Sheet for Healthcare Providers: SeriousBroker.it  This test is not yet approved  or cleared by the [place]  FDA and has been authorized for detection and/or diagnosis of SARS-CoV-2 by FDA under an Emergency Use Authorization (EUA). This EUA will remain in effect (meaning this test can be used) for the duration of the COVID-19 declaration under Section 564(b)(1) of the Act, 21 U.S.C. section 360bbb-3(b)(1), unless the authorization is terminated or revoked.  Performed at [facility], [address] [place], [place] [address]   Culture, blood (routine x 2)     Status: None (Preliminary result)   Collection Time: 09/21/23  4:38 PM   Specimen: BLOOD  Result Value Ref Range Status  Specimen Description BLOOD RAC  Final   Special Requests   Final    BOTTLES DRAWN AEROBIC AND ANAEROBIC Blood Culture adequate volume   Culture   Final    NO GROWTH 2 DAYS Performed at [facility], [address] [place], [place] [address]    Report Status PENDING  Incomplete  MRSA Next Gen by PCR, Nasal     Status: None   Collection Time: 09/21/23  9:29 PM   Specimen: Nasal Mucosa; Nasal Swab  Result Value Ref Range Status   MRSA by PCR Next Gen NOT DETECTED NOT DETECTED Final    Comment: (NOTE) The GeneXpert MRSA Assay (FDA approved for NASAL specimens only), is one component of a comprehensive MRSA colonization surveillance program. It is not intended to diagnose MRSA infection nor to guide or monitor treatment for MRSA infections. Test performance is not FDA approved in patients less than 61 years old. Performed at [facility], [address] [place], [place] [address]   Respiratory (~20 pathogens) panel by PCR     Status: None   Collection Time: 09/21/23  9:29 PM   Specimen: Nasal Mucosa; Respiratory  Result Value Ref Range Status   Adenovirus NOT DETECTED NOT DETECTED Final   Coronavirus 229E NOT DETECTED NOT DETECTED Final    Comment: (NOTE) The Coronavirus on the Respiratory Panel, DOES NOT test for the novel  Coronavirus (2019  nCoV)    Coronavirus HKU1 NOT DETECTED NOT DETECTED Final   Coronavirus NL63 NOT DETECTED NOT DETECTED Final   Coronavirus OC43 NOT DETECTED NOT DETECTED Final   Metapneumovirus NOT DETECTED NOT DETECTED Final   Rhinovirus / Enterovirus NOT DETECTED NOT DETECTED Final   Influenza A NOT DETECTED NOT DETECTED Final   Influenza B NOT DETECTED NOT DETECTED Final   Parainfluenza Virus 1 NOT DETECTED NOT DETECTED Final   Parainfluenza Virus 2 NOT DETECTED NOT DETECTED Final   Parainfluenza Virus 3 NOT DETECTED NOT DETECTED Final   Parainfluenza Virus 4 NOT DETECTED NOT DETECTED Final   Respiratory Syncytial Virus NOT DETECTED NOT DETECTED Final   Bordetella pertussis NOT DETECTED NOT DETECTED Final   Bordetella Parapertussis NOT DETECTED NOT DETECTED Final   Chlamydophila pneumoniae NOT DETECTED NOT DETECTED Final   Mycoplasma pneumoniae NOT DETECTED NOT DETECTED Final    Comment: Performed at [facility], [address], [place], [place] [address]     Total time spend on discharging this patient, including the last patient exam, discussing the hospital stay, instructions for ongoing care as it relates to all pertinent caregivers, as well as preparing the medical discharge records, prescriptions, and/or referrals as applicable, is 35 minutes.    Garrison Kanner, MD  [facility] 09/23/2023, 8:29 AM

## 2023-09-23 NOTE — Plan of Care (Signed)
   Problem: Education: Goal: Knowledge of General Education information will improve Description: Including pain rating scale, medication(s)/side effects and non-pharmacologic comfort measures Outcome: Progressing   Problem: Health Behavior/Discharge Planning: Goal: Ability to manage health-related needs will improve Outcome: Progressing   Problem: Clinical Measurements: Goal: Ability to maintain clinical measurements within normal limits will improve Outcome: Progressing Goal: Will remain free from infection Outcome: Progressing Goal: Respiratory complications will improve Outcome: Progressing   Problem: Nutrition: Goal: Adequate nutrition will be maintained Outcome: Progressing

## 2023-09-23 NOTE — TOC Transition Note (Signed)
 Transition of Care [facility]) - Discharge Note   Patient Details  Name: Christine Johns MRN: [id number] Date of Birth: May 27, 1941  [facility] CM/SW Contact:  Alexandra Ice, RN Phone Number: 09/23/2023, 9:17 AM   Clinical Narrative:    Patient to discharge today home with home health services. She is current with Christine Johns, sent message to Christine Johns notifying that patient is discharging today and to resume home health services. Christine Johns confirmed she is able to accept patient back. Christine Johns, daughter, confirmed patient has DME at home and is able to provide transportation home. No additional TOC identified will continue to monitor.   Final next level of care: Home w Home Health Services Barriers to Discharge: Barriers Resolved   Patient Goals and CMS Choice            Discharge Placement                Patient to be transferred to facility by: Daughter Name of family member notified: Christine Johns Patient and family notified of of transfer: 09/23/23  Discharge Plan and Services Additional resources added to the After Visit Summary for                            [facility] Arranged: PT, OT [facility] Agency: [facility] Date [facility] Agency Contacted: 09/23/23 Time HH Agency Contacted: [phone] Representative spoke with at [facility] Agency: Christine Johns  Social Drivers of Health (SDOH) Interventions SDOH Screenings   Food Insecurity: No Food Insecurity (09/22/2023)  Housing: Low Risk  (09/22/2023)  Transportation Needs: No Transportation Needs (09/22/2023)  Utilities: Not At Risk (09/22/2023)  Financial Resource Strain: Low Risk  (04/29/2023)   Received from [facility]  Social Connections: Moderately Integrated (09/22/2023)  Tobacco Use: Low Risk  (09/07/2023)     Readmission Risk Interventions    09/11/2023   10:53 AM  Readmission Risk Prevention Plan  Transportation Screening Complete  PCP or Specialist Appt within 3-5 Days Complete  HRI or Home Care Consult  Complete  Social Work Consult for Recovery Care Planning/Counseling Complete  Palliative Care Screening Not Applicable  Medication Review Oceanographer) Complete

## 2023-09-26 LAB — CULTURE, BLOOD (ROUTINE X 2)
Culture: NO GROWTH
Culture: NO GROWTH
Special Requests: ADEQUATE

## 2023-10-01 ENCOUNTER — Other Ambulatory Visit: Payer: Self-pay

## 2023-10-01 ENCOUNTER — Emergency Department
Admission: EM | Admit: 2023-10-01 | Discharge: 2023-10-01 | Disposition: A | Discharge status: Home / Self Care | Attending: Emergency Medicine | Admitting: Emergency Medicine

## 2023-10-01 ENCOUNTER — Emergency Department

## 2023-10-01 DIAGNOSIS — R0602 Shortness of breath: Secondary | ICD-10-CM | POA: Diagnosis present

## 2023-10-01 DIAGNOSIS — F0390 Unspecified dementia without behavioral disturbance: Secondary | ICD-10-CM | POA: Diagnosis not present

## 2023-10-01 DIAGNOSIS — I130 Hypertensive heart and chronic kidney disease with heart failure and stage 1 through stage 4 chronic kidney disease, or unspecified chronic kidney disease: Secondary | ICD-10-CM | POA: Insufficient documentation

## 2023-10-01 DIAGNOSIS — I4891 Unspecified atrial fibrillation: Secondary | ICD-10-CM | POA: Diagnosis not present

## 2023-10-01 DIAGNOSIS — N189 Chronic kidney disease, unspecified: Secondary | ICD-10-CM | POA: Diagnosis not present

## 2023-10-01 DIAGNOSIS — D649 Anemia, unspecified: Secondary | ICD-10-CM | POA: Insufficient documentation

## 2023-10-01 DIAGNOSIS — R7989 Other specified abnormal findings of blood chemistry: Secondary | ICD-10-CM | POA: Insufficient documentation

## 2023-10-01 DIAGNOSIS — I509 Heart failure, unspecified: Secondary | ICD-10-CM | POA: Insufficient documentation

## 2023-10-01 LAB — COMPREHENSIVE METABOLIC PANEL WITH GFR
ALT: 52 U/L — ABNORMAL HIGH (ref 0–44)
AST: 66 U/L — ABNORMAL HIGH (ref 15–41)
Albumin: 2.6 g/dL — ABNORMAL LOW (ref 3.5–5.0)
Alkaline Phosphatase: 122 U/L (ref 38–126)
Anion gap: 12 (ref 5–15)
BUN: 20 mg/dL (ref 8–23)
CO2: 20 mmol/L — ABNORMAL LOW (ref 22–32)
Calcium: 9 mg/dL (ref 8.9–10.3)
Chloride: 110 mmol/L (ref 98–111)
Creatinine, Ser: 1.22 mg/dL — ABNORMAL HIGH (ref 0.44–1.00)
GFR, Estimated: 43 mL/min — ABNORMAL LOW (ref >60)
Glucose, Bld: 119 mg/dL — ABNORMAL HIGH (ref 70–99)
Potassium: 3.6 mmol/L (ref 3.5–5.1)
Sodium: 142 mmol/L (ref 135–145)
Total Bilirubin: 0.7 mg/dL (ref 0.0–1.2)
Total Protein: 6.7 g/dL (ref 6.5–8.1)

## 2023-10-01 LAB — CBC WITH DIFFERENTIAL/PLATELET
Abs Immature Granulocytes: 0.11 10*3/uL — ABNORMAL HIGH (ref 0.00–0.07)
Basophils Absolute: 0.1 10*3/uL (ref 0.0–0.1)
Basophils Relative: 1 %
Eosinophils Absolute: 0.1 10*3/uL (ref 0.0–0.5)
Eosinophils Relative: 1 %
HCT: 29.8 % — ABNORMAL LOW (ref 36.0–46.0)
Hemoglobin: 9.2 g/dL — ABNORMAL LOW (ref 12.0–15.0)
Immature Granulocytes: 2 %
Lymphocytes Relative: 21 %
Lymphs Abs: 1.2 10*3/uL (ref 0.7–4.0)
MCH: 29.7 pg (ref 26.0–34.0)
MCHC: 30.9 g/dL (ref 30.0–36.0)
MCV: 96.1 fL (ref 80.0–100.0)
Monocytes Absolute: 0.6 10*3/uL (ref 0.1–1.0)
Monocytes Relative: 10 %
Neutro Abs: 3.8 10*3/uL (ref 1.7–7.7)
Neutrophils Relative %: 65 %
Platelets: 314 10*3/uL (ref 150–400)
RBC: 3.1 MIL/uL — ABNORMAL LOW (ref 3.87–5.11)
RDW: 16 % — ABNORMAL HIGH (ref 11.5–15.5)
WBC: 5.7 10*3/uL (ref 4.0–10.5)
nRBC: 0.3 % — ABNORMAL HIGH (ref 0.0–0.2)

## 2023-10-01 LAB — TROPONIN I (HIGH SENSITIVITY)
Troponin I (High Sensitivity): 126 ng/L (ref <18)
Troponin I (High Sensitivity): 126 ng/L (ref <18)

## 2023-10-01 LAB — MAGNESIUM: Magnesium: 1.5 mg/dL — ABNORMAL LOW (ref 1.7–2.4)

## 2023-10-01 MED ORDER — MAGNESIUM OXIDE -MG SUPPLEMENT 400 (240 MG) MG PO TABS
400.0000 mg | ORAL_TABLET | Freq: Once | ORAL | Status: AC
  Administered 2023-10-01: 400 mg via ORAL
  Filled 2023-10-01: qty 1

## 2023-10-01 MED ORDER — METOPROLOL TARTRATE 25 MG PO TABS
25.0000 mg | ORAL_TABLET | Freq: Once | ORAL | Status: AC
  Administered 2023-10-01: 25 mg via ORAL
  Filled 2023-10-01: qty 1

## 2023-10-01 MED ORDER — ASPIRIN 325 MG PO TABS
325.0000 mg | ORAL_TABLET | Freq: Once | ORAL | Status: AC
  Administered 2023-10-01: 325 mg via ORAL
  Filled 2023-10-01: qty 1

## 2023-10-01 NOTE — ED Triage Notes (Signed)
 BIBEMS, coming from home. C/o SOB and lethargic that started recently. Dx from hospital last week, treated with IV ABX. 150ml of NS. HR 78-130. 20g LAC PMH: dementia, a-fib. 160/77. VSS. GCS 14 baseline. BLG: 170

## 2023-10-01 NOTE — Discharge Instructions (Signed)
 Keep your scheduled follow-up with cardiology tomorrow.  You are welcome to return to the ER at any time to complete your evaluation.

## 2023-10-01 NOTE — ED Provider Notes (Signed)
 [facility] Provider Note    Event Date/Time   First MD Initiated Contact with Patient 10/01/23 1137     (approximate)   History   Weakness   HPI  Christine Johns is a 83 year old female with history of A-fib, CHF, HTN, CKD, dementia, recent admission for pneumonia presenting to the emergency department for evaluation of weakness.  Presents from home for shortness of breath and increased fatigue.  Family not initially present, on reevaluation they are at bedside and reported that patient does seem to be improved at that time.  I did review patient's resource discharge summary from 09/23/2023.  At that time, patient was admitted for weakness after recent prior admission for multilobar pneumonia secondary to metapneumovirus. During most recent admission patient was felt to be unlikely to have a bacterial pneumonia.  She was evaluated by therapy and discharged with home health.  She was also noted to have rate controlled A-fib.     Physical Exam   Triage Vital Signs: ED Triage Vitals [10/01/23 1135]  Encounter Vitals Group     BP 123/60     Systolic BP Percentile      Diastolic BP Percentile      Pulse Rate (!) 129     Resp 18     Temp 97.6 F (36.4 C)     Temp src      SpO2 99 %     Weight      Height      Head Circumference      Peak Flow      Pain Score      Pain Loc      Pain Education      Exclude from Growth Chart     Most recent vital signs: Vitals:   10/01/23 1135  BP: 123/60  Pulse: (!) 129  Resp: 18  Temp: 97.6 F (36.4 C)  SpO2: 99%     General: Awake, interactive  CV:  Tachycardic with irregularly irregular rhythm ranging from the 90s to 110s at the time of my evaluation Resp:  Unlabored respirations, lungs good auscultation Abd:  Nondistended, soft, nontender Neuro:  Symmetric facial movement, fluid speech   ED Results / Procedures / Treatments   Labs (all labs ordered are listed, but only abnormal results are  displayed) Labs Reviewed  CBC WITH DIFFERENTIAL/PLATELET - Abnormal; Notable for the following components:      Result Value   RBC 3.10 (*)    Hemoglobin 9.2 (*)    HCT 29.8 (*)    RDW 16.0 (*)    nRBC 0.3 (*)    Abs Immature Granulocytes 0.11 (*)    All other components within normal limits  COMPREHENSIVE METABOLIC PANEL WITH GFR - Abnormal; Notable for the following components:   CO2 20 (*)    Glucose, Bld 119 (*)    Creatinine, Ser 1.22 (*)    Albumin 2.6 (*)    AST 66 (*)    ALT 52 (*)    GFR, Estimated 43 (*)    All other components within normal limits  MAGNESIUM  - Abnormal; Notable for the following components:   Magnesium  1.5 (*)    All other components within normal limits  TROPONIN I (HIGH SENSITIVITY) - Abnormal; Notable for the following components:   Troponin I (High Sensitivity) 126 (*)    All other components within normal limits  TROPONIN I (HIGH SENSITIVITY) - Abnormal; Notable for the following components:   Troponin I (High  Sensitivity) 126 (*)    All other components within normal limits     EKG EKG independently reviewed interpreted by myself (ER attending) demonstrates:  EKG demonstrates A-fib at a rate of 106, QRS 102, QTc 510, incomplete right bundle large block present, no acute ischemic changes  RADIOLOGY Imaging independently reviewed and interpreted by myself demonstrates:  CXR without obvious focal consolidation, atelectasis versus edema noted, clinically not volume overloaded Formal Radiology Read:  DG Chest Portable 1 View Result Date: 10/01/2023 CLINICAL DATA:  Shortness of breath. EXAM: PORTABLE CHEST 1 VIEW COMPARISON:  September 21, 2023. FINDINGS: Stable cardiomegaly with possible central pulmonary vascular congestion. Bibasilar opacities are noted concerning for possible edema or atelectasis. Small pleural effusions are noted. Bony thorax is unremarkable. IMPRESSION: Stable cardiomegaly with possible central pulmonary vascular congestion.  Bibasilar edema or atelectasis is noted with small pleural effusions. Electronically Signed   By: Rosalene Colon M.D.   On: 10/01/2023 13:26    PROCEDURES:  Critical Care performed: No  Procedures   MEDICATIONS ORDERED IN ED: Medications  metoprolol  tartrate (LOPRESSOR ) tablet 25 mg (25 mg Oral Given 10/01/23 1224)  magnesium  oxide (MAG-OX) tablet 400 mg (400 mg Oral Given 10/01/23 1359)  aspirin tablet 325 mg (325 mg Oral Given 10/01/23 1509)     IMPRESSION / MDM / ASSESSMENT AND PLAN / ED COURSE  I reviewed the triage vital signs and the nursing notes.  Differential diagnosis includes, but is not limited to, arrhythmia, anemia, electrolyte abnormality, ACS, recurrent pneumonia  Patient's presentation is most consistent with acute presentation with potential threat to life or bodily function.  83 year old female presenting with weakness and shortness of breath, improved at the time of my evaluation.  Initially tachycardic at 129 with A-fib, but improved to the low 100s at the time of my initial evaluation on subsequent evaluations.  Labs with stable anemia and renal dysfunction.  Mild hypomagnesemia, orally repleted.  Troponin is elevated at 126, stable on repeat.  I suspect that this is potentially related to demand in the setting of her A-fib with RVR.  However, I did recommend admission for further evaluation and management of her A-fib as well as her elevated troponin/NSTEMI.  Patient reports feeling much better and family reports that they do not feel that the patient benefits from admissions at her facility.  She has an appointment with her cardiologist tomorrow and patient and family including daughter do not want to be admitted today.  They do understand that patient has acute strain on her heart and could have an acute cardiac event including death before their appointment tomorrow.   The patient and family want leave against medical advise. I spoke with the patient and family  extensively regarding the risks of leaving prior to completion of an appropriate workup and interventions given their symptomatology. We discussed possibility of significant morbidity and death that may occur as a result of them leaving. The patient and family expressed understanding of the situation and items mentioned, however would like to leave our care.   I have determined that the patient and family have the capacity for this medical decision. I have discussed the risks and benefits of the proposed treatment and treatment alternatives including non-treatment. I have offered an open invitation to the patient to return at any time for care. I have determined that the patient and family understand this discussion and that the patient and family willingly assumes the potential risk to their well-being as a result of this informed  refusal.     FINAL CLINICAL IMPRESSION(S) / ED DIAGNOSES   Final diagnoses:  Atrial fibrillation with rapid ventricular response (HCC)  Elevated troponin     Rx / DC Orders   ED Discharge Orders     None        Note:  This document was prepared using Dragon voice recognition software and may include unintentional dictation errors.   Claria Crofts, MD 10/01/23 [phone]

## 2024-02-16 ENCOUNTER — Ambulatory Visit: Admitting: Podiatry

## 2024-03-11 ENCOUNTER — Other Ambulatory Visit (HOSPITAL_COMMUNITY): Payer: Self-pay

## 2024-05-24 ENCOUNTER — Ambulatory Visit: Admitting: Podiatry
